# Patient Record
Sex: Female | Born: 1975 | Hispanic: No | Marital: Married | Smoking: Former smoker
Health system: Southern US, Community
[De-identification: ages and names within clinical notes are randomized; demographics above are authoritative.]

## PROBLEM LIST (undated history)

## (undated) DIAGNOSIS — I10 Essential (primary) hypertension: Secondary | ICD-10-CM

## (undated) DIAGNOSIS — G43909 Migraine, unspecified, not intractable, without status migrainosus: Secondary | ICD-10-CM

## (undated) DIAGNOSIS — N200 Calculus of kidney: Secondary | ICD-10-CM

## (undated) DIAGNOSIS — D131 Benign neoplasm of stomach: Secondary | ICD-10-CM

## (undated) DIAGNOSIS — I493 Ventricular premature depolarization: Secondary | ICD-10-CM

## (undated) HISTORY — DX: Essential (primary) hypertension: I10

## (undated) HISTORY — PX: APPENDECTOMY: SHX54

## (undated) HISTORY — PX: ABDOMINAL HYSTERECTOMY: SHX81

## (undated) HISTORY — DX: Calculus of kidney: N20.0

## (undated) HISTORY — PX: CHOLECYSTECTOMY: SHX55

## (undated) HISTORY — PX: HERNIA REPAIR: SHX51

## (undated) HISTORY — DX: Migraine, unspecified, not intractable, without status migrainosus: G43.909

## (undated) HISTORY — PX: TUBAL LIGATION: SHX77

## (undated) HISTORY — PX: 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): SHX6667

---

## 2015-04-17 DIAGNOSIS — D069 Carcinoma in situ of cervix, unspecified: Secondary | ICD-10-CM | POA: Insufficient documentation

## 2017-06-15 ENCOUNTER — Ambulatory Visit: Payer: BLUE CROSS/BLUE SHIELD | Admitting: Cardiovascular Disease

## 2017-06-15 ENCOUNTER — Encounter: Payer: Self-pay | Admitting: Cardiovascular Disease

## 2017-06-15 ENCOUNTER — Encounter (INDEPENDENT_AMBULATORY_CARE_PROVIDER_SITE_OTHER): Payer: Self-pay

## 2017-06-15 VITALS — BP 116/80 | HR 93 | Ht 66.0 in | Wt 214.0 lb

## 2017-06-15 DIAGNOSIS — I493 Ventricular premature depolarization: Secondary | ICD-10-CM | POA: Diagnosis not present

## 2017-06-15 DIAGNOSIS — R0602 Shortness of breath: Secondary | ICD-10-CM

## 2017-06-15 MED ORDER — DILTIAZEM HCL ER COATED BEADS 120 MG PO CP24: 120.0000 mg | ORAL_CAPSULE | Freq: Every day | ORAL | 11 refills | Status: DC

## 2017-06-15 NOTE — Progress Notes (Signed)
Chief Complaint  Patient presents with  . New Patient (Initial Visit)    PVC's   History of Present Illness: 42 yo female with no significant past medical history who is here today as a new consult, referred by Dr. Zadie Rhine, for evaluation of fatigue and abnormal EKG. She has had fatigue and lack of energy for several weeks. She was seen this am by Dr. Radene Ou and was found to have bradycardia on exam. EKG showed sinus rhythm with frequent PVCs in pattern of bigeminy with overall heart rate of 90 bpm. She reports being exhausted for the last few days. She has had dizziness but no syncope. This is when bending over. She denies chest pain but she does have occasional feeling of dyspnea when she is dizzy. She drinks caffeine in the am. She does not take stimulants.   Primary Care Physician: Aretta Nip, MD  Past Medical History:  Diagnosis Date  . HTN (hypertension)   . Migraines   . Nephrolithiasis     Past Surgical History:  Procedure Laterality Date  . ABDOMINAL HYSTERECTOMY    . APPENDECTOMY    . CHOLECYSTECTOMY    . HERNIA REPAIR    . TUBAL LIGATION     Now reversed    Current Outpatient Medications  Medication Sig Dispense Refill  . hydrochlorothiazide (HYDRODIURIL) 25 MG tablet Take 25 mg by mouth daily.  1  . diltiazem (CARDIZEM CD) 120 MG 24 hr capsule Take 1 capsule (120 mg total) by mouth daily. 30 capsule 11   No current facility-administered medications for this visit.     Allergies  Allergen Reactions  . Nitrofurantoin Rash  . Hydrocodone Itching  . Latex Rash  . Prochlorperazine     Other reaction(s): Other Bugs crawling    Social History   Socioeconomic History  . Marital status: Married    Spouse name: Not on file  . Number of children: 3  . Years of education: Not on file  . Highest education level: Not on file  Social Needs  . Financial resource strain: Not on file  . Food insecurity - worry: Not on file  . Food insecurity - inability:  Not on file  . Transportation needs - medical: Not on file  . Transportation needs - non-medical: Not on file  Occupational History  . Occupation: Student [employer]  Tobacco Use  . Smoking status: Former Smoker    Packs/day: 0.50    Years: 10.00    Pack years: 5.00    Types: Cigarettes  . Smokeless tobacco: Never Used  Substance and Sexual Activity  . Alcohol use: Yes    Comment: Rare  . Drug use: No  . Sexual activity: Not on file  Other Topics Concern  . Not on file  Social History Narrative  . Not on file    Family History  Problem Relation Age of Onset  . Heart attack Father        70s    Review of Systems:  As stated in the HPI and otherwise negative.   BP 116/80   Pulse 93   Ht 5\' 6"  (1.676 m)   Wt 214 lb (97.1 kg)   SpO2 98%   BMI 34.54 kg/m   Physical Examination: General: Well developed, well nourished, NAD  HEENT: OP clear, mucus membranes moist  SKIN: warm, dry. No rashes. Neuro: No focal deficits  Musculoskeletal: Muscle strength 5/5 all ext  Psychiatric: Mood and affect normal  Neck: No JVD, no  carotid bruits, no thyromegaly, no lymphadenopathy.  Lungs:Clear bilaterally, no wheezes, rhonci, crackles Cardiovascular: Regular rate and rhythm. No murmurs, gallops or rubs. Abdomen:Soft. Bowel sounds present. Non-tender.  Extremities: No lower extremity edema. Pulses are 2 + in the bilateral DP/PT.  EKG:  EKG from this am in Dr. Radene Ou office is reviewed  The ekg demonstrates Sinus, rate 91 bpm. Frequent PVCs with bigeminy.   Recent Labs: No results found for requested labs within last 8760 hours.   Lipid Panel No results found for: CHOL, TRIG, HDL, CHOLHDL, VLDL, LDLCALC, LDLDIRECT   Wt Readings from Last 3 Encounters:  06/15/17 214 lb (97.1 kg)     Other studies Reviewed: Additional studies/ records that were reviewed today include: office notes, EKG Review of the above records demonstrates:    Assessment and Plan:   1. PVCs/bigeminy: EKG  from this am shows PVCs with bigeminy. Exam this afternoon is regular without ectopy. I suspect that her fatigue is from the PVCS. I will arrange an echo to assess LV function and exclude structural heart disease. I will stop Norvasc and start Cardizem CD 120 mg daily. I will check a TSH and BMET. We will get a 48 hour monitor next week to determine the number of PVCs and response to addition of the Cardizem.   Current medicines are reviewed at length with the patient today.  The patient does not have concerns regarding medicines.  The following changes have been made:  no change  Labs/ tests ordered today include:   Orders Placed This Encounter  Procedures  . Basic Metabolic Panel (BMET)  . TSH  . HOLTER MONITOR - 48 HOUR  . ECHOCARDIOGRAM COMPLETE     Disposition:   FU with me in 6 weeks   Signed, Lauree Chandler, MD 06/15/2017 3:49 PM    [facility] [address] [place] [place], [place], [place]  [address] Phone: [phone]; Fax: [phone]

## 2017-06-15 NOTE — Patient Instructions (Addendum)
Medication Instructions:  Your physician has recommended you make the following change in your medication:  Stop Norvasc. Start Cardizem CD 120 mg by mouth daily.    Labwork: Lab work to be done today--BMP and TSH  Testing/Procedures: Your physician has requested that you have an echocardiogram. Echocardiography is a painless test that uses sound waves to create images of your heart. It provides your doctor with information about the size and shape of your heart and how well your heart's chambers and valves are working. This procedure takes approximately one hour. There are no restrictions for this procedure.  Please schedule for about one week from now.  Your physician has recommended that you wear a holter monitor. Holter monitors are medical devices that record the heart's electrical activity. Doctors most often use these monitors to diagnose arrhythmias. Arrhythmias are problems with the speed or rhythm of the heartbeat. The monitor is a small, portable device. You can wear one while you do your normal daily activities. This is usually used to diagnose what is causing palpitations/syncope (passing out).  Please scheduled for about one week from now  Follow-Up: Your physician recommends that you schedule a follow-up appointment in: about 8 weeks. --Scheduled for April 15,2018 at 9:40    Any Other Special Instructions Will Be Listed Below (If Applicable).     If you need a refill on your cardiac medications before your next appointment, please call your pharmacy.

## 2017-06-16 LAB — BASIC METABOLIC PANEL
BUN/Creatinine Ratio: 10 (ref 9–23)
BUN: 8 mg/dL (ref 6–24)
CO2: 22 mmol/L (ref 20–29)
Calcium: 9 mg/dL (ref 8.7–10.2)
Chloride: 106 mmol/L (ref 96–106)
Creatinine, Ser: 0.79 mg/dL (ref 0.57–1.00)
GFR calc Af Amer: 106 mL/min/{1.73_m2} (ref >59)
GFR calc non Af Amer: 92 mL/min/{1.73_m2} (ref >59)
Glucose: 90 mg/dL (ref 65–99)
Potassium: 4 mmol/L (ref 3.5–5.2)
Sodium: 143 mmol/L (ref 134–144)

## 2017-06-16 LAB — TSH: TSH: 3.58 u[IU]/mL (ref 0.450–4.500)

## 2017-06-23 ENCOUNTER — Ambulatory Visit (HOSPITAL_COMMUNITY): Payer: BLUE CROSS/BLUE SHIELD | Attending: Cardiology

## 2017-06-23 ENCOUNTER — Ambulatory Visit (INDEPENDENT_AMBULATORY_CARE_PROVIDER_SITE_OTHER): Payer: BLUE CROSS/BLUE SHIELD

## 2017-06-23 ENCOUNTER — Other Ambulatory Visit: Payer: Self-pay

## 2017-06-23 DIAGNOSIS — I493 Ventricular premature depolarization: Secondary | ICD-10-CM

## 2017-06-23 DIAGNOSIS — R0602 Shortness of breath: Secondary | ICD-10-CM | POA: Insufficient documentation

## 2017-06-30 ENCOUNTER — Telehealth: Payer: Self-pay | Admitting: Cardiovascular Disease

## 2017-06-30 NOTE — Telephone Encounter (Signed)
Left message to call back  

## 2017-06-30 NOTE — Telephone Encounter (Signed)
New message     Patient is returning your call , she will be done with work after 1pm please call then

## 2017-06-30 NOTE — Telephone Encounter (Signed)
I spoke with pt and reviewed monitor results with her.  She reports she is still having fatigue and thinks BP is not as well controlled as it was on amlodipine. She is going out of town on Saturday for a week.  I scheduled her for follow up with Ermalinda Barrios, PA on March 13,2019 at 11:00.

## 2017-06-30 NOTE — Telephone Encounter (Signed)
New message ° °Pt verbalized that she is returning call for RN °

## 2017-06-30 NOTE — Telephone Encounter (Signed)
Follow Up:; ° ° °Returning your call. °

## 2017-07-06 ENCOUNTER — Encounter: Payer: Self-pay | Admitting: Physician Assistant

## 2017-07-13 ENCOUNTER — Encounter: Payer: Self-pay | Admitting: Physician Assistant

## 2017-07-13 ENCOUNTER — Ambulatory Visit (INDEPENDENT_AMBULATORY_CARE_PROVIDER_SITE_OTHER): Payer: BLUE CROSS/BLUE SHIELD | Admitting: Physician Assistant

## 2017-07-13 DIAGNOSIS — I493 Ventricular premature depolarization: Secondary | ICD-10-CM | POA: Diagnosis not present

## 2017-07-13 DIAGNOSIS — I10 Essential (primary) hypertension: Secondary | ICD-10-CM

## 2017-07-13 DIAGNOSIS — R5383 Other fatigue: Secondary | ICD-10-CM | POA: Diagnosis not present

## 2017-07-13 MED ORDER — METOPROLOL SUCCINATE ER 50 MG PO TB24: 50.0000 mg | ORAL_TABLET | Freq: Every day | ORAL | 11 refills | Status: DC

## 2017-07-13 NOTE — Progress Notes (Signed)
Cardiology Office Note    Date:  07/13/2017   ID:  Glenisha Gundry, DOB 10-Aug-1975, MRN [id number]  PCP:  Aretta Nip, MD  Cardiologist: Lauree Chandler, MD  Chief Complaint  Patient presents with  . Palpitations    History of Present Illness:  Marvelle Span is a 42 y.o. female who saw Dr. Angelena Form for the first time 06/15/17 for fatigue and abnormal EKG.  She had normal sinus rhythm with frequent PVCs in the pattern of bigeminy and overall her heart rate was 90 bpm.  She had dizziness but no syncope.  Norvasc was stopped Cardizem CD 120 mg daily added.  2D echo showed normal LV function EF 55% with grade 1 DD.  48-hour monitor showed frequent PVCs-24,548 and 48-hour.  Often in the pattern of bigeminy and rare PAC.  Dr. Angelena Form 1 to bring her back to adjust her Cardizem if she was not feeling any better and discuss referral to EP.  TSH was normal at 3.58 and potassium and renal function were normal.  Patient comes in today not feeling any better.  She still having just as many palpitations on the Cardizem.  Her blood pressure has been elevated as well.  Mostly diastolic.  She was in [place] last week looking at [employer].  She had no trouble walking around.    Past Medical History:  Diagnosis Date  . HTN (hypertension)   . Migraines   . Nephrolithiasis     Past Surgical History:  Procedure Laterality Date  . ABDOMINAL HYSTERECTOMY    . APPENDECTOMY    . CHOLECYSTECTOMY    . HERNIA REPAIR    . TUBAL LIGATION     Now reversed    Current Medications: Current Meds  Medication Sig  . hydrochlorothiazide (HYDRODIURIL) 25 MG tablet Take 25 mg by mouth daily.  . [DISCONTINUED] diltiazem (CARDIZEM CD) 120 MG 24 hr capsule Take 1 capsule (120 mg total) by mouth daily.     Allergies:   Nitrofurantoin; Hydrocodone; Latex; and Prochlorperazine   Social History   Socioeconomic History  . Marital status: Married    Spouse name: None  . Number of children: 3    . Years of education: None  . Highest education level: None  Social Needs  . Financial resource strain: None  . Food insecurity - worry: None  . Food insecurity - inability: None  . Transportation needs - medical: None  . Transportation needs - non-medical: None  Occupational History  . Occupation: Student [employer]  Tobacco Use  . Smoking status: Former Smoker    Packs/day: 0.50    Years: 10.00    Pack years: 5.00    Types: Cigarettes  . Smokeless tobacco: Never Used  Substance and Sexual Activity  . Alcohol use: Yes    Comment: Rare  . Drug use: No  . Sexual activity: None  Other Topics Concern  . None  Social History Narrative  . None     Family History:  The patient's family history includes Heart attack in her father.   ROS:   Please see the history of present illness.    Review of Systems  Constitution: Positive for malaise/fatigue.  HENT: Negative.   Eyes: Negative.   Cardiovascular: Positive for palpitations.  Respiratory: Negative.   Hematologic/Lymphatic: Negative.   Musculoskeletal: Negative.  Negative for joint pain.  Gastrointestinal: Negative.   Genitourinary: Negative.   Neurological: Negative.    All other systems reviewed and are negative.   PHYSICAL EXAM:  VS:  BP (!) 118/98   Pulse (!) 104   Ht 5\' 6"  (1.676 m)   Wt 209 lb (94.8 kg)   BMI 33.73 kg/m   Physical Exam  GEN: Well nourished, well developed, in no acute distress  Neck: no JVD, carotid bruits, or masses Cardiac:RRR; lots of skipping no murmurs, rubs, or gallops  Respiratory:  clear to auscultation bilaterally, normal work of breathing GI: soft, nontender, nondistended, + BS Ext: without cyanosis, clubbing, or edema, Good distal pulses bilaterally Neuro:  Alert and Oriented x 3, Strength and sensation are intact Psych: euthymic mood, full affect  Wt Readings from Last 3 Encounters:  07/13/17 209 lb (94.8 kg)  06/15/17 214 lb (97.1 kg)      Studies/Labs Reviewed:   EKG:   EKG is not ordered today.    Recent Labs: 06/15/2017: BUN 8; Creatinine, Ser 0.79; Potassium 4.0; Sodium 143; TSH 3.580   Lipid Panel No results found for: CHOL, TRIG, HDL, CHOLHDL, VLDL, LDLCALC, LDLDIRECT  Additional studies/ records that were reviewed today include:  2D echo 06/23/17 Study Conclusions   - Left ventricle: The cavity size was normal. Wall thickness was   increased in a pattern of mild LVH. The estimated ejection   fraction was 55%. Wall motion was normal; there were no regional   wall motion abnormalities. Doppler parameters are consistent with   abnormal left ventricular relaxation (grade 1 diastolic   dysfunction). - Aortic valve: There was no stenosis. There was trivial   regurgitation. - Mitral valve: Mildly calcified annulus. There was no significant   regurgitation. - Right ventricle: The cavity size was normal. Systolic function   was normal. - Tricuspid valve: Peak RV-RA gradient (S): 15 mm Hg. - Pulmonary arteries: PA peak pressure: 18 mm Hg (S). - Inferior vena cava: The vessel was normal in size. The   respirophasic diameter changes were in the normal range (= 50%),   consistent with normal central venous pressure.   Impressions:   - Normal LV size with mild LV hypertrophy. EF 55%. Normal RV size   and systolic function. No significant valvular abnormalities.    48-hour monitor 2/21/18Study Highlights   Sinus rhythm  Frequent premature ventricular contractions (24,548 during 48 hour monitoring period) often in pattern of bigeminy Rare premature atrial contractions (2 during monitoring period)     ASSESSMENT:    1. PVC's (premature ventricular contractions)   2. Fatigue, unspecified type   3. Essential hypertension      PLAN:  In order of problems listed above:  PVCs with over 24,048-hour.  Most likely contributing to fatigue.  Echo basically normal LV function with grade 1 DD.  No improvement with diltiazem.  Discussed with Dr.  Angelena Form.  Will switch to Toprol-XL 50 mg once daily and refer to Dr. Lovena Le for EP evaluation.  Essential hypertension blood pressure not controlled on diltiazem.  Hopefully Toprol will do a better job.  2 g sodium diet.     Medication Adjustments/Labs and Tests Ordered: Current medicines are reviewed at length with the patient today.  Concerns regarding medicines are outlined above.  Medication changes, Labs and Tests ordered today are listed in the Patient Instructions below. Patient Instructions  Medication Instructions:  1) STOP DILTIAZEM 2) START METOPROLOL 50 mg daily  Labwork: None ordered  Testing/Procedures: None ordered  Follow-Up: You have an appointment scheduled with Dr. Lovena Le (the electrophysiology specialist) on Friday, July 29, 2017 at 3:30PM.  Any Other Special Instructions Will Be Listed  Below (If Applicable).     If you need a refill on your cardiac medications before your next appointment, please call your pharmacy.      Sumner Boast, PA-C  07/13/2017 12:49 PM    [facility] [place], [place], [place]  [address] Phone: [phone]; Fax: [phone]

## 2017-07-13 NOTE — Patient Instructions (Addendum)
Medication Instructions:  1) STOP DILTIAZEM 2) START METOPROLOL 50 mg daily  Labwork: None ordered  Testing/Procedures: None ordered  Follow-Up: You have an appointment scheduled with Dr. Lovena Le (the electrophysiology specialist) on Friday, July 29, 2017 at 3:30PM.  Any Other Special Instructions Will Be Listed Below (If Applicable).     If you need a refill on your cardiac medications before your next appointment, please call your pharmacy.

## 2017-07-20 ENCOUNTER — Telehealth: Payer: Self-pay | Admitting: Cardiovascular Disease

## 2017-07-20 MED ORDER — METOPROLOL SUCCINATE ER 25 MG PO TB24: 25.0000 mg | ORAL_TABLET | Freq: Every day | ORAL | 11 refills | Status: DC

## 2017-07-20 NOTE — Telephone Encounter (Signed)
I spoke with Lisa Mosley. She states since starting Toprol she has been having shortness of breath with exertion. Walking from car into bank today she became very short of breath. Occurs when walking to bathroom or climbing stairs.  Resolves with rest.  No chest pain with exertion. Has occasional episodes of chest pain which come in waves. Usually occur when she is sitting and last for a few seconds. This is not new and she thinks this is related to palpitations.  No change in palpitations or fatigue since starting Toprol.  States BP has improved. Last check was around 118/80. She is seeing Dr. Curt Bears on 4/2 for EP evaluation.

## 2017-07-20 NOTE — Telephone Encounter (Signed)
New message   Pt c/o medication issue:  1. Name of Medication: metoprolol succinate (TOPROL-XL) 50 MG 24 hr tablet  2. How are you currently taking this medication (dosage and times per day)? Take 1 tablet (50 mg total) by mouth daily. Take with or immediately following a meal.  3. Are you having a reaction (difficulty breathing--STAT)? Sob when walking - not now   4. What is your medication issue? New medication / discuss with nurse

## 2017-07-20 NOTE — Telephone Encounter (Signed)
Patient can have shortness of breath with Toprol.Can you ask her if she felt better on cardizem? Can decrease toprol to 25 mg to see if that helps. I'd have her discuss with Dr. Lovena Le at [facility].

## 2017-07-20 NOTE — Telephone Encounter (Signed)
I spoke with pt. She did not feel better on cardizem.  I gave her instructions from Ermalinda Barrios, PA to decrease Toprol to 25 mg daily.

## 2017-07-29 ENCOUNTER — Institutional Professional Consult (permissible substitution): Payer: Self-pay | Admitting: Internal Medicine

## 2017-08-02 ENCOUNTER — Encounter: Payer: Self-pay | Admitting: Cardiology

## 2017-08-02 ENCOUNTER — Ambulatory Visit: Payer: BLUE CROSS/BLUE SHIELD | Admitting: Cardiology

## 2017-08-02 VITALS — BP 108/80 | HR 101 | Ht 66.0 in | Wt 206.0 lb

## 2017-08-02 DIAGNOSIS — I493 Ventricular premature depolarization: Secondary | ICD-10-CM | POA: Diagnosis not present

## 2017-08-02 DIAGNOSIS — I10 Essential (primary) hypertension: Secondary | ICD-10-CM

## 2017-08-02 MED ORDER — FLECAINIDE ACETATE 100 MG PO TABS: 100.0000 mg | ORAL_TABLET | Freq: Two times a day (BID) | ORAL | 3 refills | Status: DC

## 2017-08-02 MED ORDER — DILTIAZEM HCL ER COATED BEADS 180 MG PO CP24: 180.0000 mg | ORAL_CAPSULE | Freq: Every day | ORAL | 3 refills | Status: DC

## 2017-08-02 NOTE — Progress Notes (Signed)
Electrophysiology Office Note   Date:  08/02/2017   ID:  Lisa Mosley, DOB 09-Jul-1975, MRN [id number]  PCP:  Lisa Nip, MD  Cardiologist:  Lisa Mosley Primary Electrophysiologist:  Lisa Tompkins Meredith Leeds, MD    Chief Complaint  Patient presents with  . Advice Only    PVC's     History of Present Illness: Lisa Mosley is a 42 y.o. female who is being seen today for the evaluation of PVCs at the request of Lisa Mosley. Presenting today for electrophysiology evaluation.  He initially presented to cardiology 06/15/2017 for fatigue and an abnormal EKG.  She was found to have frequent PVCs in the pattern of bigeminy.  She had dizziness but no syncope.  She was started on Cardizem 120 mg.  Echo showed an EF of 55% with grade 1 diastolic dysfunction.  48-hour monitor showed 24,000 PVCs in 48 hours.  Patient continues to have palpitations with Cardizem.  Her blood pressure has been elevated.  Today continued to have fatigue and weakness.  She feels that her palpitations are continuing.  Is trying to exercise but is limited due to her episodes of fatigue.  Today, she denies symptoms of chest pain, shortness of breath, orthopnea, PND, lower extremity edema, claudication, dizziness, presyncope, syncope, bleeding, or neurologic sequela. The patient is tolerating medications without difficulties.    Past Medical History:  Diagnosis Date  . HTN (hypertension)   . Migraines   . Nephrolithiasis    Past Surgical History:  Procedure Laterality Date  . ABDOMINAL HYSTERECTOMY    . APPENDECTOMY    . CHOLECYSTECTOMY    . HERNIA REPAIR    . TUBAL LIGATION     Now reversed     Current Outpatient Medications  Medication Sig Dispense Refill  . hydrochlorothiazide (HYDRODIURIL) 25 MG tablet Take 25 mg by mouth daily.  1  . metoprolol succinate (TOPROL-XL) 25 MG 24 hr tablet Take 1 tablet (25 mg total) by mouth daily. Take with or immediately following a meal. 30 tablet 11   No current  facility-administered medications for this visit.     Allergies:   Nitrofurantoin; Hydrocodone; Latex; and Prochlorperazine   Social History:  The patient  reports that she has quit smoking. Her smoking use included cigarettes. She has a 5.00 pack-year smoking history. She has never used smokeless tobacco. She reports that she drinks alcohol. She reports that she does not use drugs.   Family History:  The patient's family history includes Heart attack in her father.    ROS:  Please see the history of present illness.   Otherwise, review of systems is positive for chest pain, palpitations, dyspnea on exertion, diarrhea, anxiety, dizziness.   All other systems are reviewed and negative.    PHYSICAL EXAM: VS:  BP 108/80   Pulse (!) 101   Ht 5\' 6"  (1.676 m)   Wt 206 lb (93.4 kg)   BMI 33.25 kg/m  , BMI Body mass index is 33.25 kg/m. GEN: Well nourished, well developed, in no acute distress  HEENT: normal  Neck: no JVD, carotid bruits, or masses Cardiac: RRR; no murmurs, rubs, or gallops,no edema  Respiratory:  clear to auscultation bilaterally, normal work of breathing GI: soft, nontender, nondistended, + BS MS: no deformity or atrophy  Skin: warm and dry Neuro:  Strength and sensation are intact Psych: euthymic mood, full affect  EKG:  EKG is ordered today. Personal review of the ekg ordered shows sinus tachycardia, rate 101  Recent Labs: 06/15/2017:  BUN 8; Creatinine, Ser 0.79; Potassium 4.0; Sodium 143; TSH 3.580    Lipid Panel  No results found for: CHOL, TRIG, HDL, CHOLHDL, VLDL, LDLCALC, LDLDIRECT   Wt Readings from Last 3 Encounters:  08/02/17 206 lb (93.4 kg)  07/13/17 209 lb (94.8 kg)  06/15/17 214 lb (97.1 kg)      Other studies Reviewed: Additional studies/ records that were reviewed today include: TTE 06/23/17  Review of the above records today demonstrates:  - Left ventricle: The cavity size was normal. Wall thickness was   increased in a pattern of mild  LVH. The estimated ejection   fraction was 55%. Wall motion was normal; there were no regional   wall motion abnormalities. Doppler parameters are consistent with   abnormal left ventricular relaxation (grade 1 diastolic   dysfunction). - Aortic valve: There was no stenosis. There was trivial   regurgitation. - Mitral valve: Mildly calcified annulus. There was no significant   regurgitation. - Right ventricle: The cavity size was normal. Systolic function   was normal. - Tricuspid valve: Peak RV-RA gradient (S): 15 mm Hg. - Pulmonary arteries: PA peak pressure: 18 mm Hg (S). - Inferior vena cava: The vessel was normal in size. The   respirophasic diameter changes were in the normal range (= 50%),   consistent with normal central venous pressure.  Holter 06/30/17 - personally reviewed Sinus rhythm  Frequent premature ventricular contractions (24,548 during 48 hour monitoring period) often in pattern of bigeminy Rare premature atrial contractions (2 during monitoring period)  ASSESSMENT AND PLAN:  1.  PVCs: High burden 48 hours.  She does have symptoms of fatigue and palpitations.  She is continuing to have symptoms despite medical management with metoprolol and diltiazem.  I talked to her about the possibility of invasive procedures with ablation versus continued medical management.  She would like to avoid procedures at this time.  We Lisa Mosley thus start her on flecainide 100 mg as well as stopping her metoprolol and restarting her diltiazem.  Hopefully coming off of metoprolol Lisa Mosley help with some of her fatigue.  2.  Hypertension: Blood pressure well controlled today.  Switching from metoprolol to diltiazem.      Current medicines are reviewed at length with the patient today.   The patient does not have concerns regarding her medicines.  The following changes were made today: Flecainide, diltiazem, stop metoprolol  Labs/ tests ordered today include:  Orders Placed This Encounter    Procedures  . EKG 12-Lead   Discussed with primary cardiology  Disposition:   FU with Lisa Mosley 3 months  Signed, Lisa Oguinn Meredith Leeds, MD  08/02/2017 12:14 PM     [place] [address] [phone] (office) [phone] (fax)

## 2017-08-02 NOTE — Patient Instructions (Addendum)
Medication Instructions: 1) STOP Metoprolol 2) START Diltiazem 180 mg daily 3) START Flecainide 100 mg twice daily - YOU WILL START THIS MEDICATION 7-10 DAYS PRIOR TO STRESS TESTING.  Labwork: None ordered  Procedures/Testing: Your physician has requested that you have an exercise tolerance test at least 7-10 days from now. For further information please visit [email or web address]. Please also follow instruction sheet, as given.   Follow-Up: Your physician recommends that you schedule a follow-up appointment in: 3 months with Dr. Curt Bears.   If you need a refill on your cardiac medications before your next appointment, please call your pharmacy.    Any Additional Special Instructions Will Be Listed Below (If Applicable).  Flecainide tablets What is this medicine? FLECAINIDE (FLEK a nide) is an antiarrhythmic drug. This medicine is used to prevent irregular heart rhythm. It can also slow down fast heartbeats called tachycardia. This medicine may be used for other purposes; ask your health care provider or pharmacist if you have questions. COMMON BRAND NAME(S): Tambocor What should I tell my health care provider before I take this medicine? They need to know if you have any of these conditions: -abnormal levels of potassium in the blood -heart disease including heart rhythm and heart rate problems -kidney or liver disease -recent heart attack -an unusual or allergic reaction to flecainide, local anesthetics, other medicines, foods, dyes, or preservatives -pregnant or trying to get pregnant -breast-feeding How should I use this medicine? Take this medicine by mouth with a glass of water. Follow the directions on the prescription label. You can take this medicine with or without food. Take your doses at regular intervals. Do not take your medicine more often than directed. Do not stop taking this medicine suddenly. This may cause serious, heart-related side effects. If your doctor wants  you to stop the medicine, the dose may be slowly lowered over time to avoid any side effects. Talk to your pediatrician regarding the use of this medicine in children. While this drug may be prescribed for children as young as 1 year of age for selected conditions, precautions do apply. Overdosage: If you think you have taken too much of this medicine contact a poison control center or emergency room at once. NOTE: This medicine is only for you. Do not share this medicine with others. What if I miss a dose? If you miss a dose, take it as soon as you can. If it is almost time for your next dose, take only that dose. Do not take double or extra doses. What may interact with this medicine? Do not take this medicine with any of the following medications: -amoxapine -arsenic trioxide -certain antibiotics like clarithromycin, erythromycin, gatifloxacin, gemifloxacin, levofloxacin, moxifloxacin, sparfloxacin, or troleandomycin -certain antidepressants called tricyclic antidepressants like amitriptyline, imipramine, or nortriptyline -certain medicines to control heart rhythm like disopyramide, dofetilide, encainide, moricizine, procainamide, propafenone, and quinidine -cisapride -cyclobenzaprine -delavirdine -droperidol -haloperidol -hawthorn -imatinib -levomethadyl -maprotiline -medicines for malaria like chloroquine and halofantrine -pentamidine -phenothiazines like chlorpromazine, mesoridazine, prochlorperazine, thioridazine -pimozide -quinine -ranolazine -ritonavir -sertindole -ziprasidone This medicine may also interact with the following medications: -cimetidine -medicines for angina or high blood pressure -medicines to control heart rhythm like amiodarone and digoxin This list may not describe all possible interactions. Give your health care provider a list of all the medicines, herbs, non-prescription drugs, or dietary supplements you use. Also tell them if you smoke, drink  alcohol, or use illegal drugs. Some items may interact with your medicine. What should I watch for while using this medicine?  Visit your doctor or health care professional for regular checks on your progress. Because your condition and the use of this medicine carries some risk, it is a good idea to carry an identification card, necklace or bracelet with details of your condition, medications and doctor or health care professional. Check your blood pressure and pulse rate regularly. Ask your health care professional what your blood pressure and pulse rate should be, and when you should contact him or her. Your doctor or health care professional also may schedule regular blood tests and electrocardiograms to check your progress. You may get drowsy or dizzy. Do not drive, use machinery, or do anything that needs mental alertness until you know how this medicine affects you. Do not stand or sit up quickly, especially if you are an older patient. This reduces the risk of dizzy or fainting spells. Alcohol can make you more dizzy, increase flushing and rapid heartbeats. Avoid alcoholic drinks. What side effects may I notice from receiving this medicine? Side effects that you should report to your doctor or health care professional as soon as possible: -chest pain, continued irregular heartbeats -difficulty breathing -swelling of the legs or feet -trembling, shaking -unusually weak or tired Side effects that usually do not require medical attention (report to your doctor or health care professional if they continue or are bothersome): -blurred vision -constipation -headache -nausea, vomiting -stomach pain This list may not describe all possible side effects. Call your doctor for medical advice about side effects. You may report side effects to FDA at 1-800-FDA-1088. Where should I keep my medicine? Keep out of the reach of children. Store at room temperature between 15 and 30 degrees C (59 and 86  degrees F). Protect from light. Keep container tightly closed. Throw away any unused medicine after the expiration date. NOTE: This sheet is a summary. It may not cover all possible information. If you have questions about this medicine, talk to your doctor, pharmacist, or health care provider.  2018 Elsevier/Gold Standard (2007-08-23 16:46:09)    Exercise Stress Electrocardiogram An exercise stress electrocardiogram is a test to check how blood flows to your heart. It is done to find areas of poor blood flow. You will need to walk on a treadmill for this test. The electrocardiogram will record your heartbeat when you are at rest and when you are exercising. What happens before the procedure?  Do not have drinks with caffeine or foods with caffeine for 24 hours before the test, or as told by your doctor. This includes coffee, tea (even decaf tea), sodas, chocolate, and cocoa.  Follow your doctor's instructions about eating and drinking before the test.  Ask your doctor what medicines you should or should not take before the test. Take your medicines with water unless told by your doctor not to.  If you use an inhaler, bring it with you to the test.  Bring a snack to eat after the test.  Do not  smoke for 4 hours before the test.  Do not put lotions, powders, creams, or oils on your chest before the test.  Wear comfortable shoes and clothing. What happens during the procedure?  You will have patches put on your chest. Small areas of your chest may need to be shaved. Wires will be connected to the patches.  Your heart rate will be watched while you are resting and while you are exercising.  You will walk on the treadmill. The treadmill will slowly get faster to raise your heart rate.  The test will take about 1-2 hours. What happens after the procedure?  Your heart rate and blood pressure will be watched after the test.  You may return to your normal diet, activities, and  medicines or as told by your doctor. This information is not intended to replace advice given to you by your health care provider. Make sure you discuss any questions you have with your health care provider. Document Released: 10/06/2007 Document Revised: 12/17/2015 Document Reviewed: 12/25/2012 Elsevier Interactive Patient Education  [employer].

## 2017-08-02 NOTE — Addendum Note (Signed)
Addended by: Stanton Kidney on: 08/02/2017 12:32 PM   Modules accepted: Orders

## 2017-08-02 NOTE — Addendum Note (Signed)
Addended by: Sandrea Hammond D on: 08/02/2017 12:27 PM   Modules accepted: Orders

## 2017-08-15 ENCOUNTER — Ambulatory Visit: Payer: BLUE CROSS/BLUE SHIELD | Admitting: Cardiovascular Disease

## 2017-08-17 ENCOUNTER — Ambulatory Visit (INDEPENDENT_AMBULATORY_CARE_PROVIDER_SITE_OTHER): Payer: BLUE CROSS/BLUE SHIELD

## 2017-08-17 DIAGNOSIS — I493 Ventricular premature depolarization: Secondary | ICD-10-CM | POA: Diagnosis not present

## 2017-08-17 LAB — EXERCISE TOLERANCE TEST
Estimated workload: 10 METS
Exercise duration (min): 8 min
Exercise duration (sec): 0 s
MPHR: 177 {beats}/min
Peak HR: 164 {beats}/min
Percent HR: 92 %
RPE: 17
Rest HR: 95 {beats}/min

## 2017-10-26 ENCOUNTER — Other Ambulatory Visit: Payer: Self-pay | Admitting: Cardiology

## 2017-11-01 ENCOUNTER — Ambulatory Visit: Payer: BLUE CROSS/BLUE SHIELD | Admitting: Cardiology

## 2017-11-01 ENCOUNTER — Encounter: Payer: Self-pay | Admitting: Cardiology

## 2017-11-01 VITALS — BP 130/98 | HR 78 | Ht 66.0 in | Wt 211.0 lb

## 2017-11-01 DIAGNOSIS — I10 Essential (primary) hypertension: Secondary | ICD-10-CM | POA: Diagnosis not present

## 2017-11-01 DIAGNOSIS — I493 Ventricular premature depolarization: Secondary | ICD-10-CM

## 2017-11-01 MED ORDER — LOSARTAN POTASSIUM 50 MG PO TABS: 50.0000 mg | ORAL_TABLET | Freq: Every day | ORAL | 6 refills | Status: DC

## 2017-11-01 NOTE — Progress Notes (Signed)
Electrophysiology Office Note   Date:  11/01/2017   ID:  Lisa Mosley, DOB Jun 25, 1975, MRN [id number]  PCP:  Aretta Nip, MD  Cardiologist:  Angelena Form Primary Electrophysiologist:  Luceal Hollibaugh Meredith Leeds, MD    Chief Complaint  Patient presents with  . Follow-up    PVC's     History of Present Illness: Lisa Mosley is a 42 y.o. female who is being seen today for the evaluation of PVCs at the request of [name]. Presenting today for electrophysiology evaluation.  He initially presented to cardiology 06/15/2017 for fatigue and an abnormal EKG.  She was found to have frequent PVCs in the pattern of bigeminy.  She had dizziness but no syncope.  She was started on Cardizem 120 mg.  Echo showed an EF of 55% with grade 1 diastolic dysfunction.  48-hour monitor showed 24,000 PVCs in 48 hours.  Patient continues to have palpitations with Cardizem.  Her blood pressure has been elevated.  Today, denies symptoms of palpitations, chest pain, shortness of breath, orthopnea, PND, lower extremity edema, claudication, dizziness, presyncope, syncope, bleeding, or neurologic sequela. The patient is tolerating medications without difficulties.  Overall she is feeling well.  She has noted no further episodes of extreme fatigue that she had with her PVCs.  She feels that her PVCs are well controlled on the flecainide.   Past Medical History:  Diagnosis Date  . HTN (hypertension)   . Migraines   . Nephrolithiasis    Past Surgical History:  Procedure Laterality Date  . ABDOMINAL HYSTERECTOMY    . APPENDECTOMY    . CHOLECYSTECTOMY    . HERNIA REPAIR    . TUBAL LIGATION     Now reversed     Current Outpatient Medications  Medication Sig Dispense Refill  . diltiazem (CARDIZEM CD) 180 MG 24 hr capsule Take 1 capsule (180 mg total) by mouth daily. 90 capsule 3  . flecainide (TAMBOCOR) 100 MG tablet Take 1 tablet (100 mg total) by mouth 2 (two) times daily. 180 tablet 3  .  hydrochlorothiazide (HYDRODIURIL) 25 MG tablet Take 25 mg by mouth daily.  1   No current facility-administered medications for this visit.     Allergies:   Nitrofurantoin; Hydrocodone; Latex; and Prochlorperazine   Social History:  The patient  reports that she has quit smoking. Her smoking use included cigarettes. She has a 5.00 pack-year smoking history. She has never used smokeless tobacco. She reports that she drinks alcohol. She reports that she does not use drugs.   Family History:  The patient's family history includes Heart attack in her father.   ROS:  Please see the history of present illness.   Otherwise, review of systems is positive for this pain, shortness of breath, depression, anxiety, headaches.   All other systems are reviewed and negative.   PHYSICAL EXAM: VS:  BP (!) 130/98   Pulse 78   Ht 5\' 6"  (1.676 m)   Wt 211 lb (95.7 kg)   BMI 34.06 kg/m  , BMI Body mass index is 34.06 kg/m. GEN: Well nourished, well developed, in no acute distress  HEENT: normal  Neck: no JVD, carotid bruits, or masses Cardiac: RRR; no murmurs, rubs, or gallops,no edema  Respiratory:  clear to auscultation bilaterally, normal work of breathing GI: soft, nontender, nondistended, + BS MS: no deformity or atrophy  Skin: warm and dry Neuro:  Strength and sensation are intact Psych: euthymic mood, full affect  EKG:  EKG is ordered today. Personal  review of the ekg ordered shows anus rhythm, rate 78, QTc 483 ms  Recent Labs: 06/15/2017: BUN 8; Creatinine, Ser 0.79; Potassium 4.0; Sodium 143; TSH 3.580    Lipid Panel  No results found for: CHOL, TRIG, HDL, CHOLHDL, VLDL, LDLCALC, LDLDIRECT   Wt Readings from Last 3 Encounters:  11/01/17 211 lb (95.7 kg)  08/02/17 206 lb (93.4 kg)  07/13/17 209 lb (94.8 kg)      Other studies Reviewed: Additional studies/ records that were reviewed today include: TTE 06/23/17  Review of the above records today demonstrates:  - Left ventricle: The  cavity size was normal. Wall thickness was   increased in a pattern of mild LVH. The estimated ejection   fraction was 55%. Wall motion was normal; there were no regional   wall motion abnormalities. Doppler parameters are consistent with   abnormal left ventricular relaxation (grade 1 diastolic   dysfunction). - Aortic valve: There was no stenosis. There was trivial   regurgitation. - Mitral valve: Mildly calcified annulus. There was no significant   regurgitation. - Right ventricle: The cavity size was normal. Systolic function   was normal. - Tricuspid valve: Peak RV-RA gradient (S): 15 mm Hg. - Pulmonary arteries: PA peak pressure: 18 mm Hg (S). - Inferior vena cava: The vessel was normal in size. The   respirophasic diameter changes were in the normal range (= 50%),   consistent with normal central venous pressure.  Holter 06/30/17 - personally reviewed Sinus rhythm  Frequent premature ventricular contractions (24,548 during 48 hour monitoring period) often in pattern of bigeminy Rare premature atrial contractions (2 during monitoring period)  ASSESSMENT AND PLAN:  1.  PVCs: She had high burden of PVCs in 48 hours and had symptoms of fatigue and palpitations.  She was put on flecainide which had a great improvement in her symptoms.  At this point, she would like to avoid further invasive testing.  We Bernis Stecher continue flecainide.  Systolic blood pressure is well controlled but diastolic is elevated.  2.  Hypertension: Due to that, we Zaryiah Barz stop her hydrochlorothiazide and start her on 50 of losartan.   Current medicines are reviewed at length with the patient today.   The patient does not have concerns regarding her medicines.  The following changes were made today: Stop hydrochlorothiazide, start losartan  Labs/ tests ordered today include:  Orders Placed This Encounter  Procedures  . EKG 12-Lead    Disposition:   FU with Kiarah Eckstein 6 months  Signed, Velmer Broadfoot Meredith Leeds,  MD  11/01/2017 9:47 AM     [facility] [address] [place] [address] [phone] (office) [phone] (fax)

## 2017-11-01 NOTE — Patient Instructions (Signed)
Medication Instructions: 1) Stop Hydrochlorothiazide   2) Start Losartan 50 mg once daily  Labwork: None ordered   Procedures/Testing: None ordered  Follow-Up: Your physician recommends that you schedule a follow-up appointment in: 6 months with Dr. Curt Bears.   Any Additional Special Instructions Will Be Listed Below (If Applicable).     If you need a refill on your cardiac medications before your next appointment, please call your pharmacy.

## 2017-11-01 NOTE — Addendum Note (Signed)
Addended by: Sandrea Hammond D on: 11/01/2017 10:01 AM   Modules accepted: Orders

## 2017-11-21 ENCOUNTER — Encounter: Payer: Self-pay | Admitting: Cardiology

## 2017-11-21 ENCOUNTER — Other Ambulatory Visit: Payer: Self-pay

## 2017-11-21 MED ORDER — FLECAINIDE ACETATE 100 MG PO TABS: 100.0000 mg | ORAL_TABLET | Freq: Two times a day (BID) | ORAL | 0 refills | Status: DC

## 2017-11-21 MED ORDER — LOSARTAN POTASSIUM 50 MG PO TABS: 50.0000 mg | ORAL_TABLET | Freq: Every day | ORAL | 0 refills | Status: DC

## 2017-11-24 ENCOUNTER — Encounter: Payer: Self-pay | Admitting: Cardiology

## 2017-11-24 ENCOUNTER — Other Ambulatory Visit: Payer: Self-pay | Admitting: Cardiology

## 2017-11-24 NOTE — Telephone Encounter (Signed)
Pt sent MyChart message stating:  Hi, I accidentally requested a refill for Losartan but I don't actually need that one because it's already been refilled. Please ignore that request and I'm sorry for the confusion.   Thanks,   Lisa Mosley

## 2017-12-01 MED ORDER — DILTIAZEM HCL ER COATED BEADS 180 MG PO CP24: 180.0000 mg | ORAL_CAPSULE | Freq: Every day | ORAL | 3 refills | Status: DC

## 2017-12-01 NOTE — Addendum Note (Signed)
Addended by: Derl Barrow on: 12/01/2017 11:05 AM   Modules accepted: Orders

## 2017-12-01 NOTE — Telephone Encounter (Signed)
Pt's medication was resent to a mail order pharmacy. Confirmation received.  °

## 2018-01-26 ENCOUNTER — Telehealth: Payer: Self-pay | Admitting: Cardiology

## 2018-01-26 NOTE — Telephone Encounter (Signed)
Pt reports noticing frequent PVCs again, since Monday. She is feeling her PVCs again causing her to be fatigued.  She also reports some chest discomfort w/ PVCs. Pt is scheduled for follow up to discuss on Monday. Advised to monitor and call the office if worsens before then or go to the E.R. for evaluation. Patient verbalized understanding and agreeable to plan.  Will forward to Dr. Curt Bears for any advisement he may have before appt on Monday.  Pt understands I will only call back if he has recommendations before next week.

## 2018-01-26 NOTE — Telephone Encounter (Signed)
New Message   Patient is calling because she has been having more frequent PVC's, She also status that she has been a bit more fatigue with some chest pain. She is not sure whether or not she should come in but I did schedule her an appt for the 9/30 at 11:00am. Please call to discuss.

## 2018-01-30 ENCOUNTER — Ambulatory Visit: Payer: BLUE CROSS/BLUE SHIELD | Admitting: Cardiology

## 2018-01-30 ENCOUNTER — Encounter: Payer: Self-pay | Admitting: Cardiology

## 2018-01-30 VITALS — BP 130/84 | HR 76 | Ht 66.0 in | Wt 208.0 lb

## 2018-01-30 DIAGNOSIS — I10 Essential (primary) hypertension: Secondary | ICD-10-CM

## 2018-01-30 DIAGNOSIS — I493 Ventricular premature depolarization: Secondary | ICD-10-CM

## 2018-01-30 MED ORDER — DILTIAZEM HCL ER COATED BEADS 120 MG PO CP24: 120.0000 mg | ORAL_CAPSULE | Freq: Every day | ORAL | 3 refills | Status: DC

## 2018-01-30 NOTE — Patient Instructions (Addendum)
Medication Instructions:  Your physician has recommended you make the following change in your medication:  1. INCREASE Diltiazem to 120 mg once daily  * If you need a refill on your cardiac medications before your next appointment, please call your pharmacy.   Labwork: None ordered  Testing/Procedures: Your physician has recommended that you wear a 48 hour holter monitor. Holter monitors are medical devices that record the heart's electrical activity. Doctors most often use these monitors to diagnose arrhythmias. Arrhythmias are problems with the speed or rhythm of the heartbeat. The monitor is a small, portable device. You can wear one while you do your normal daily activities. This is usually used to diagnose what is causing palpitations/syncope (passing out).   Follow-Up: Your physician recommends that you schedule a follow-up appointment in: 3 months with Dr. Curt Bears.   Thank you for choosing [facility]!!   Trinidad Curet, RN [phone]

## 2018-01-30 NOTE — Progress Notes (Signed)
Electrophysiology Office Note   Date:  01/30/2018   ID:  Lisa Mosley, DOB 05-Jul-1975, MRN [id number]  PCP:  Aretta Nip, MD  Cardiologist:  Angelena Form Primary Electrophysiologist:  Omolara Carol Meredith Leeds, MD    No chief complaint on file.    History of Present Illness: Lisa Mosley is a 42 y.o. female who is being seen today for the evaluation of PVCs at the request of Lisa Mosley. Presenting today for electrophysiology evaluation.  He initially presented to cardiology 06/15/2017 for fatigue and an abnormal EKG.  She was found to have frequent PVCs in the pattern of bigeminy.  She had dizziness but no syncope.  She was started on Cardizem 120 mg.  Echo showed an EF of 55% with grade 1 diastolic dysfunction.  48-hour monitor showed 24,000 PVCs in 48 hours.  Patient continues to have palpitations with Cardizem.  Her blood pressure has been elevated.  Today, denies symptoms of chest pain, shortness of breath, orthopnea, PND, lower extremity edema, claudication, dizziness, presyncope, syncope, bleeding, or neurologic sequela. The patient is tolerating medications without difficulties.  Fortunately, she is started to have palpitations again.  This started 1 week ago.  There were no exacerbating or alleviating factors.  It feels very similar to when she had her previous episodes of PVCs.  She felt well before 1 week ago.   Past Medical History:  Diagnosis Date  . HTN (hypertension)   . Migraines   . Nephrolithiasis    Past Surgical History:  Procedure Laterality Date  . ABDOMINAL HYSTERECTOMY    . APPENDECTOMY    . CHOLECYSTECTOMY    . HERNIA REPAIR    . TUBAL LIGATION     Now reversed     Current Outpatient Medications  Medication Sig Dispense Refill  . escitalopram (LEXAPRO) 10 MG tablet Take 10 mg by mouth daily.  2  . flecainide (TAMBOCOR) 100 MG tablet Take 1 tablet (100 mg total) by mouth 2 (two) times daily. 180 tablet 0  . losartan (COZAAR) 50 MG tablet Take 1  tablet (50 mg total) by mouth daily. 90 tablet 0  . diltiazem (CARDIZEM CD) 120 MG 24 hr capsule Take 1 capsule (120 mg total) by mouth daily. 30 capsule 3   No current facility-administered medications for this visit.     Allergies:   Nitrofurantoin; Prochlorperazine; Hydrocodone; and Latex   Social History:  The patient  reports that she has quit smoking. Her smoking use included cigarettes. She has a 5.00 pack-year smoking history. She has never used smokeless tobacco. She reports that she drinks alcohol. She reports that she does not use drugs.   Family History:  The patient's family history includes Heart attack in her father.   ROS:  Please see the history of present illness.   Otherwise, review of systems is positive for fatigue, chest pain, leg pain, palpitations.   All other systems are reviewed and negative.   PHYSICAL EXAM: VS:  BP 130/84   Pulse 76   Ht 5\' 6"  (1.676 m)   Wt 208 lb (94.3 kg)   BMI 33.57 kg/m  , BMI Body mass index is 33.57 kg/m. GEN: Well nourished, well developed, in no acute distress  HEENT: normal  Neck: no JVD, carotid bruits, or masses Cardiac: RRR; no murmurs, rubs, or gallops,no edema  Respiratory:  clear to auscultation bilaterally, normal work of breathing GI: soft, nontender, nondistended, + BS MS: no deformity or atrophy  Skin: warm and dry Neuro:  Strength  and sensation are intact Psych: euthymic mood, full affect  EKG:  EKG is ordered today. Personal review of the ekg ordered shows this rhythm, rate 76   Recent Labs: 06/15/2017: BUN 8; Creatinine, Ser 0.79; Potassium 4.0; Sodium 143; TSH 3.580    Lipid Panel  No results found for: CHOL, TRIG, HDL, CHOLHDL, VLDL, LDLCALC, LDLDIRECT   Wt Readings from Last 3 Encounters:  01/30/18 208 lb (94.3 kg)  11/01/17 211 lb (95.7 kg)  08/02/17 206 lb (93.4 kg)      Other studies Reviewed: Additional studies/ records that were reviewed today include: TTE 06/23/17  Review of the above  records today demonstrates:  - Left ventricle: The cavity size was normal. Wall thickness was   increased in a pattern of mild LVH. The estimated ejection   fraction was 55%. Wall motion was normal; there were no regional   wall motion abnormalities. Doppler parameters are consistent with   abnormal left ventricular relaxation (grade 1 diastolic   dysfunction). - Aortic valve: There was no stenosis. There was trivial   regurgitation. - Mitral valve: Mildly calcified annulus. There was no significant   regurgitation. - Right ventricle: The cavity size was normal. Systolic function   was normal. - Tricuspid valve: Peak RV-RA gradient (S): 15 mm Hg. - Pulmonary arteries: PA peak pressure: 18 mm Hg (S). - Inferior vena cava: The vessel was normal in size. The   respirophasic diameter changes were in the normal range (= 50%),   consistent with normal central venous pressure.  Holter 06/30/17 - personally reviewed Sinus rhythm  Frequent premature ventricular contractions (24,548 during 48 hour monitoring period) often in pattern of bigeminy Rare premature atrial contractions (2 during monitoring period)  ASSESSMENT AND PLAN:  1.  PVCs: He recently had a 9% burden.  Flecainide which greatly improved her symptoms.  She returns today having symptoms similar to when she was having PVCs.  She is not having any PVCs on her EKG today.  Due to that, we Taneal Sonntag get a 48-hour monitor to further evaluate.  Also having some fatigue.  We Valary Manahan see if decreasing diltiazem Luverne Zerkle help.  2.  Hypertension: Well-controlled today.  No changes.  Current medicines are reviewed at length with the patient today.   The patient does not have concerns regarding her medicines.  The following changes were made today: Decrease diltiazem  Labs/ tests ordered today include:  Orders Placed This Encounter  Procedures  . Holter monitor - 48 hour  . EKG 12-Lead    Disposition:   FU with Channel Papandrea 3  months  Signed, Mieko Kneebone Meredith Leeds, MD  01/30/2018 11:16 AM     [facility] [address] [place] [address] [phone] (office) [phone] (fax)

## 2018-01-31 ENCOUNTER — Ambulatory Visit (INDEPENDENT_AMBULATORY_CARE_PROVIDER_SITE_OTHER): Payer: BLUE CROSS/BLUE SHIELD

## 2018-01-31 DIAGNOSIS — I493 Ventricular premature depolarization: Secondary | ICD-10-CM

## 2018-02-25 ENCOUNTER — Other Ambulatory Visit: Payer: Self-pay | Admitting: Cardiology

## 2018-03-01 ENCOUNTER — Other Ambulatory Visit: Payer: Self-pay | Admitting: Cardiology

## 2018-03-01 MED ORDER — DILTIAZEM HCL ER COATED BEADS 120 MG PO CP24: 120.0000 mg | ORAL_CAPSULE | Freq: Every day | ORAL | 0 refills | Status: DC

## 2018-03-04 ENCOUNTER — Encounter (HOSPITAL_COMMUNITY): Payer: Self-pay | Admitting: Emergency Medicine

## 2018-03-04 ENCOUNTER — Emergency Department (HOSPITAL_COMMUNITY): Payer: BLUE CROSS/BLUE SHIELD

## 2018-03-04 ENCOUNTER — Emergency Department (HOSPITAL_COMMUNITY)
Admission: EM | Admit: 2018-03-04 | Discharge: 2018-03-04 | Disposition: A | Discharge status: Home / Self Care | Payer: BLUE CROSS/BLUE SHIELD | Attending: Emergency Medicine | Admitting: Emergency Medicine

## 2018-03-04 DIAGNOSIS — S92424A Nondisplaced fracture of distal phalanx of right great toe, initial encounter for closed fracture: Secondary | ICD-10-CM | POA: Diagnosis not present

## 2018-03-04 DIAGNOSIS — Z87891 Personal history of nicotine dependence: Secondary | ICD-10-CM | POA: Diagnosis not present

## 2018-03-04 DIAGNOSIS — W109XXA Fall (on) (from) unspecified stairs and steps, initial encounter: Secondary | ICD-10-CM | POA: Insufficient documentation

## 2018-03-04 DIAGNOSIS — Y999 Unspecified external cause status: Secondary | ICD-10-CM | POA: Insufficient documentation

## 2018-03-04 DIAGNOSIS — Y929 Unspecified place or not applicable: Secondary | ICD-10-CM | POA: Diagnosis not present

## 2018-03-04 DIAGNOSIS — S99921A Unspecified injury of right foot, initial encounter: Secondary | ICD-10-CM | POA: Diagnosis present

## 2018-03-04 DIAGNOSIS — Z79899 Other long term (current) drug therapy: Secondary | ICD-10-CM | POA: Insufficient documentation

## 2018-03-04 DIAGNOSIS — Y9301 Activity, walking, marching and hiking: Secondary | ICD-10-CM | POA: Diagnosis not present

## 2018-03-04 DIAGNOSIS — Z91040 Latex allergy status: Secondary | ICD-10-CM | POA: Insufficient documentation

## 2018-03-04 DIAGNOSIS — I10 Essential (primary) hypertension: Secondary | ICD-10-CM | POA: Diagnosis not present

## 2018-03-04 MED ORDER — OXYCODONE-ACETAMINOPHEN 5-325 MG PO TABS: 1.0000 | ORAL_TABLET | Freq: Three times a day (TID) | ORAL | 0 refills | Status: DC | PRN

## 2018-03-04 MED ORDER — OXYCODONE-ACETAMINOPHEN 5-325 MG PO TABS
1.0000 | ORAL_TABLET | Freq: Once | ORAL | Status: AC
  Administered 2018-03-04: 1 via ORAL
  Filled 2018-03-04: qty 1

## 2018-03-04 NOTE — ED Provider Notes (Signed)
[facility] EMERGENCY DEPARTMENT Provider Note   CSN: [id number] Arrival date & time: 03/04/18  1711     History   Chief Complaint Chief Complaint  Patient presents with  . Foot Pain    HPI Lisa Mosley is a 42 y.o. female center for evaluation of right foot pain.  Patient states approximately 4 hours ago, she slipped going down some stairs, and had acute onset right great toe/foot pain.  Since then, patient has been unable to walk due to pain.  Pain is constant, nothing makes it better or worse.  She took 800 mg of ibuprofen without improvement of symptoms.  She has not tried anything else.  No radiation of pain.  She denies numbness or tingling.  Denies injury elsewhere.  She denies hitting her head or loss of consciousness.  She is not on blood thinners.   HPI  Past Medical History:  Diagnosis Date  . HTN (hypertension)   . Migraines   . Nephrolithiasis     Patient Active Problem List   Diagnosis Date Noted  . PVC's (premature ventricular contractions) 07/13/2017  . Fatigue 07/13/2017  . Hypertension 07/13/2017  . Severe dysplasia of cervix 04/17/2015    Past Surgical History:  Procedure Laterality Date  . ABDOMINAL HYSTERECTOMY    . APPENDECTOMY    . CHOLECYSTECTOMY    . HERNIA REPAIR    . TUBAL LIGATION     Now reversed     OB History   None      Home Medications    Prior to Admission medications   Medication Sig Start Date End Date Taking? Authorizing Provider  diltiazem (CARDIZEM CD) 120 MG 24 hr capsule Take 1 capsule (120 mg total) by mouth daily. Please keep upcoming appt in January with Dr. Curt Bears for future refills. Thank you 03/01/18   Constance Haw, MD  escitalopram (LEXAPRO) 10 MG tablet Take 10 mg by mouth daily. 01/22/18   [provider]  flecainide (TAMBOCOR) 100 MG tablet TAKE 1 TABLET TWICE A DAY 02/27/18   Camnitz, Will Hassell Done, MD  losartan (COZAAR) 50 MG tablet TAKE 1 TABLET DAILY 02/27/18    Camnitz, Ocie Doyne, MD  oxyCODONE-acetaminophen (PERCOCET/ROXICET) 5-325 MG tablet Take 1 tablet by mouth every 8 (eight) hours as needed for severe pain. 03/04/18   Braven Wolk, PA-C    Family History Family History  Problem Relation Age of Onset  . Heart attack Father        80s    Social History Social History   Tobacco Use  . Smoking status: Former Smoker    Packs/day: 0.50    Years: 10.00    Pack years: 5.00    Types: Cigarettes  . Smokeless tobacco: Never Used  Substance Use Topics  . Alcohol use: Yes    Comment: Rare  . Drug use: No     Allergies   Nitrofurantoin; Prochlorperazine; Hydrocodone; and Latex   Review of Systems Review of Systems  Musculoskeletal: Positive for arthralgias and joint swelling.  Neurological: Negative for numbness.  Hematological: Does not bruise/bleed easily.     Physical Exam Updated Vital Signs BP (!) 137/103 (BP Location: Left Arm)   Pulse 73   Temp 98.7 F (37.1 C) (Oral)   Resp 16   SpO2 98%   Physical Exam  Constitutional: She is oriented to person, place, and time. She appears well-developed and well-nourished. No distress.  HENT:  Head: Normocephalic and atraumatic.  Eyes: EOM are normal.  Neck: Normal range of motion.  Pulmonary/Chest: Effort normal.  Abdominal: She exhibits no distension.  Musculoskeletal: She exhibits edema and tenderness.  Mild contusion noted at the first MTP.  Tenderness palpation of the right great toe and first MTP.  Tenderness palpation of the distal first metatarsal.  No tenderness palpation elsewhere in the foot.  Pedal pulses intact bilaterally.  Sensation intact bilaterally.  Decreased range of motion of the big toe due to pain.  Full active range of motion of ankle without pain.  No tenderness palpation of the medial or lateral malleolus.   Neurological: She is alert and oriented to person, place, and time. No sensory deficit.  Skin: Skin is warm. Capillary refill takes less  than 2 seconds. No rash noted.  Psychiatric: She has a normal mood and affect.  Nursing note and vitals reviewed.    ED Treatments / Results  Labs (all labs ordered are listed, but only abnormal results are displayed) Labs Reviewed - No data to display  EKG None  Radiology Dg Foot Complete Right  Result Date: 03/04/2018 CLINICAL DATA:  Fall down stairs with foot pain, initial encounter EXAM: RIGHT FOOT COMPLETE - 3+ VIEW COMPARISON:  None. FINDINGS: There is a fracture through the base of the first distal phalanx extending into the articular surface. No other fractures are seen. No soft tissue changes are noted IMPRESSION: Fracture through the base of the first distal phalanx extending into the articular surface. Electronically Signed   By: Inez Catalina M.D.   On: 03/04/2018 17:54    Procedures Procedures (including critical care time)  Medications Ordered in ED Medications  oxyCODONE-acetaminophen (PERCOCET/ROXICET) 5-325 MG per tablet 1 tablet (1 tablet Oral Given 03/04/18 1820)     Initial Impression / Assessment and Plan / ED Course  I have reviewed the triage vital signs and the nursing notes.  Pertinent labs & imaging results that were available during my care of the patient were reviewed by me and considered in my medical decision making (see chart for details).     Pt presenting for evaluation of right foot/great toe pain.  Physical exam shows patient is neurovascularly intact.  Significant tenderness, causing to be hard for her to walk.  X-rays viewed interpreted by me, shows fracture of the first distal phalanx extends through the articular surface.  As there is intra-articular involvement, will place patient in cam walker and crutches.  Discussed follow-up with orthopedics.  Discussed pain control with Tylenol, ibuprofen, and Percocet as needed. PMP checked, pt without controlled substance rx in the past 2 months.  At this time, patient appears safe for discharge.   Return precautions given.  Patient states she understands and agrees plan.   Final Clinical Impressions(s) / ED Diagnoses   Final diagnoses:  Nondisplaced fracture of distal phalanx of right great toe, initial encounter for closed fracture    ED Discharge Orders         Ordered    oxyCODONE-acetaminophen (PERCOCET/ROXICET) 5-325 MG tablet  Every 8 hours PRN     03/04/18 1835           Franchot Heidelberg, PA-C 03/04/18 2213    Virgel Manifold, MD 03/05/18 2350

## 2018-03-04 NOTE — Discharge Instructions (Addendum)
Take ibuprofen 3 times a day with meals.  Do not take other anti-inflammatories at the same time (Advil, Motrin, naproxen, Aleve). You may supplement with Tylenol if you need further pain control. Take percocet as needed for severe or breakthrough pain.  Have caution while making this medicine, as it may make you tired or groggy.  This may increase your risk of falls if you are using crutches. Call orthopedics on Monday for follow-up appointment. Return to the emergency room with any new, worsening, concerning symptoms.

## 2018-03-04 NOTE — ED Triage Notes (Signed)
Pt presents to ED for assessment of right foot pain after she fell down the stairs, scraping the top of her foot on the steps.  Redness, swelling noted.  Small scrape to top of foot as well.

## 2018-03-04 NOTE — ED Notes (Signed)
Patient transported to X-ray 

## 2018-05-02 NOTE — Telephone Encounter (Signed)
Advised to increase Losartan to 100 mg daily, per Dr. Curt Bears.   Pt will continue to monitor BP until she sees [place] this Friday in office.  (will not send in new Rx until we see her Friday, pt is aware of this) Patient verbalized understanding and agreeable to plan.

## 2018-05-04 NOTE — Progress Notes (Signed)
Electrophysiology Office Note   Date:  05/05/2018   ID:  Lisa Mosley, DOB May 24, 1975, MRN [id number]  PCP:  Aretta Nip, MD  Cardiologist:  Angelena Form Primary Electrophysiologist:  Macy Polio Meredith Leeds, MD    No chief complaint on file.    History of Present Illness: Lisa Mosley is a 43 y.o. female who is being seen today for the evaluation of PVCs at the request of [name]. Presenting today for electrophysiology evaluation.  He initially presented to cardiology 06/15/2017 for fatigue and an abnormal EKG.  She was found to have frequent PVCs in the pattern of bigeminy.  She had dizziness but no syncope.  She was started on Cardizem 120 mg.  Echo showed an EF of 55% with grade 1 diastolic dysfunction.  48-hour monitor showed 24,000 PVCs in 48 hours.  Patient continues to have palpitations with Cardizem.  Her blood pressure has been elevated.  Today, denies symptoms of palpitations, chest pain, shortness of breath, orthopnea, PND, lower extremity edema, claudication, dizziness, presyncope, syncope, bleeding, or neurologic sequela. The patient is tolerating medications without difficulties.  Overall she is feeling well.  She has no chest pain or shortness of breath.  She is noted a great decrease in her PVC symptoms.  She did have an episode of syncope around Thanksgiving.  She said that she stood up too fast, got dizzy and had tunnel vision and passed out.  She has had no further episodes.   Past Medical History:  Diagnosis Date  . HTN (hypertension)   . Migraines   . Nephrolithiasis    Past Surgical History:  Procedure Laterality Date  . ABDOMINAL HYSTERECTOMY    . APPENDECTOMY    . CHOLECYSTECTOMY    . HERNIA REPAIR    . TUBAL LIGATION     Now reversed     Current Outpatient Medications  Medication Sig Dispense Refill  . diltiazem (CARDIZEM CD) 120 MG 24 hr capsule Take 1 capsule (120 mg total) by mouth daily. Please keep upcoming appt in January with Dr.  Curt Bears for future refills. Thank you 90 capsule 0  . escitalopram (LEXAPRO) 10 MG tablet Take 10 mg by mouth daily.  2  . flecainide (TAMBOCOR) 100 MG tablet TAKE 1 TABLET TWICE A DAY 180 tablet 3  . losartan (COZAAR) 50 MG tablet TAKE 1 TABLET DAILY 90 tablet 3  . oxyCODONE-acetaminophen (PERCOCET/ROXICET) 5-325 MG tablet Take 1 tablet by mouth every 8 (eight) hours as needed for severe pain. 6 tablet 0   No current facility-administered medications for this visit.     Allergies:   Nitrofurantoin; Prochlorperazine; Hydrocodone; and Latex   Social History:  The patient  reports that she has quit smoking. Her smoking use included cigarettes. She has a 5.00 pack-year smoking history. She has never used smokeless tobacco. She reports current alcohol use. She reports that she does not use drugs.   Family History:  The patient's family history includes Heart attack in her father.   ROS:  Please see the history of present illness.   Otherwise, review of systems is positive for palpitations, cough, dizziness, passing out, headaches.   All other systems are reviewed and negative.   PHYSICAL EXAM: VS:  There were no vitals taken for this visit. , BMI There is no height or weight on file to calculate BMI. GEN: Well nourished, well developed, in no acute distress  HEENT: normal  Neck: no JVD, carotid bruits, or masses Cardiac: RRR; no murmurs, rubs, or gallops,no  edema  Respiratory:  clear to auscultation bilaterally, normal work of breathing GI: soft, nontender, nondistended, + BS MS: no deformity or atrophy  Skin: warm and dry Neuro:  Strength and sensation are intact Psych: euthymic mood, full affect  EKG:  EKG is ordered today. Personal review of the ekg ordered shows sinus rhythm, rate 91  Recent Labs: 06/15/2017: BUN 8; Creatinine, Ser 0.79; Potassium 4.0; Sodium 143; TSH 3.580    Lipid Panel  No results found for: CHOL, TRIG, HDL, CHOLHDL, VLDL, LDLCALC, LDLDIRECT   Wt Readings  from Last 3 Encounters:  01/30/18 208 lb (94.3 kg)  11/01/17 211 lb (95.7 kg)  08/02/17 206 lb (93.4 kg)      Other studies Reviewed: Additional studies/ records that were reviewed today include: TTE 06/23/17  Review of the above records today demonstrates:  - Left ventricle: The cavity size was normal. Wall thickness was   increased in a pattern of mild LVH. The estimated ejection   fraction was 55%. Wall motion was normal; there were no regional   wall motion abnormalities. Doppler parameters are consistent with   abnormal left ventricular relaxation (grade 1 diastolic   dysfunction). - Aortic valve: There was no stenosis. There was trivial   regurgitation. - Mitral valve: Mildly calcified annulus. There was no significant   regurgitation. - Right ventricle: The cavity size was normal. Systolic function   was normal. - Tricuspid valve: Peak RV-RA gradient (S): 15 mm Hg. - Pulmonary arteries: PA peak pressure: 18 mm Hg (S). - Inferior vena cava: The vessel was normal in size. The   respirophasic diameter changes were in the normal range (= 50%),   consistent with normal central venous pressure.  Holter 02/08/18 - personally reviewed Minimum HR: 56 BPM at 4:19:48 AM Maximum HR: 143 BPM at 6:19:33 PM Average HR: 82 BPM Ventricular Beats: 7966 (3%) Supraventricular Beats: 2 (<1%) Sinus rhythm with sinus tachycardia seen. PVCs and PACs noted. Short episodes of trigeminy and bigeminy. Diary events of chest pain associated with sinus rhythm No arrhythmias noted  ASSESSMENT AND PLAN:  1.  PVCs: Wore a cardiac monitor recently that showed a burden of 3% on flecainide.  She is unaware of further episodes of PVCs.  No changes at this time.    2.  Hypertension: Blood pressure well controlled today.  Continue losartan.  3.  Syncope: Appears to be due to orthostatic hypotension.  She stood up and had a tunnel vision sensation.  No further episodes.  Due to the mechanism, I do not feel  like we need to restrict her driving.  No changes.  Current medicines are reviewed at length with the patient today.   The patient does not have concerns regarding her medicines.  The following changes were made today: Decrease diltiazem  Labs/ tests ordered today include:  No orders of the defined types were placed in this encounter.   Disposition:   FU with Lisa Mosley 6 months  Signed, Timika Muench Meredith Leeds, MD  05/05/2018 11:07 AM     [facility] [address] [place] [address] [phone] (office) [phone] (fax)

## 2018-05-05 ENCOUNTER — Encounter: Payer: Self-pay | Admitting: Cardiology

## 2018-05-05 ENCOUNTER — Ambulatory Visit: Payer: BLUE CROSS/BLUE SHIELD | Admitting: Cardiology

## 2018-05-05 VITALS — BP 110/84 | HR 91 | Ht 66.0 in | Wt 215.4 lb

## 2018-05-05 DIAGNOSIS — I10 Essential (primary) hypertension: Secondary | ICD-10-CM

## 2018-05-05 DIAGNOSIS — I493 Ventricular premature depolarization: Secondary | ICD-10-CM | POA: Diagnosis not present

## 2018-05-05 MED ORDER — LOSARTAN POTASSIUM 100 MG PO TABS: 100.0000 mg | ORAL_TABLET | Freq: Every day | ORAL | 3 refills | Status: DC

## 2018-05-05 NOTE — Patient Instructions (Signed)
Medication Instructions:  Your physician recommends that you continue on your current medications as directed. Please refer to the Current Medication list given to you today.  If you need a refill on your cardiac medications before your next appointment, please call your pharmacy.   Labwork: None ordered  Testing/Procedures: None ordered  Follow-Up: Your physician wants you to follow-up in: 6 months with Dr. Camnitz.  You will receive a reminder letter in the mail two months in advance. If you don't receive a letter, please call our office to schedule the follow-up appointment.  Thank you for choosing [facility]!!   Eathan Groman, RN [phone]         

## 2018-08-10 ENCOUNTER — Other Ambulatory Visit: Payer: Self-pay | Admitting: Cardiology

## 2018-08-10 MED ORDER — DILTIAZEM HCL ER COATED BEADS 120 MG PO CP24: 120.0000 mg | ORAL_CAPSULE | Freq: Every day | ORAL | 2 refills | Status: DC

## 2018-10-22 ENCOUNTER — Emergency Department (HOSPITAL_COMMUNITY)
Admission: EM | Admit: 2018-10-22 | Discharge: 2018-10-22 | Disposition: A | Discharge status: Home / Self Care | Payer: BC Managed Care – PPO | Attending: Emergency Medicine | Admitting: Emergency Medicine

## 2018-10-22 ENCOUNTER — Encounter (HOSPITAL_COMMUNITY): Payer: Self-pay

## 2018-10-22 ENCOUNTER — Other Ambulatory Visit: Payer: Self-pay

## 2018-10-22 ENCOUNTER — Emergency Department (HOSPITAL_COMMUNITY): Payer: BC Managed Care – PPO

## 2018-10-22 DIAGNOSIS — I10 Essential (primary) hypertension: Secondary | ICD-10-CM | POA: Insufficient documentation

## 2018-10-22 DIAGNOSIS — K1120 Sialoadenitis, unspecified: Secondary | ICD-10-CM | POA: Diagnosis not present

## 2018-10-22 DIAGNOSIS — Z79899 Other long term (current) drug therapy: Secondary | ICD-10-CM | POA: Insufficient documentation

## 2018-10-22 DIAGNOSIS — R221 Localized swelling, mass and lump, neck: Secondary | ICD-10-CM | POA: Diagnosis present

## 2018-10-22 DIAGNOSIS — Z91040 Latex allergy status: Secondary | ICD-10-CM | POA: Insufficient documentation

## 2018-10-22 DIAGNOSIS — Z87891 Personal history of nicotine dependence: Secondary | ICD-10-CM | POA: Insufficient documentation

## 2018-10-22 LAB — CBC WITH DIFFERENTIAL/PLATELET
Abs Immature Granulocytes: 0.03 10*3/uL (ref 0.00–0.07)
Basophils Absolute: 0.1 10*3/uL (ref 0.0–0.1)
Basophils Relative: 1 %
Eosinophils Absolute: 0.3 10*3/uL (ref 0.0–0.5)
Eosinophils Relative: 3 %
HCT: 45.4 % (ref 36.0–46.0)
Hemoglobin: 15 g/dL (ref 12.0–15.0)
Immature Granulocytes: 0 %
Lymphocytes Relative: 37 %
Lymphs Abs: 4.3 10*3/uL — ABNORMAL HIGH (ref 0.7–4.0)
MCH: 29.5 pg (ref 26.0–34.0)
MCHC: 33 g/dL (ref 30.0–36.0)
MCV: 89.4 fL (ref 80.0–100.0)
Monocytes Absolute: 0.5 10*3/uL (ref 0.1–1.0)
Monocytes Relative: 4 %
Neutro Abs: 6.3 10*3/uL (ref 1.7–7.7)
Neutrophils Relative %: 55 %
Platelets: 252 10*3/uL (ref 150–400)
RBC: 5.08 MIL/uL (ref 3.87–5.11)
RDW: 11.9 % (ref 11.5–15.5)
WBC: 11.4 10*3/uL — ABNORMAL HIGH (ref 4.0–10.5)
nRBC: 0 % (ref 0.0–0.2)

## 2018-10-22 LAB — BASIC METABOLIC PANEL
Anion gap: 11 (ref 5–15)
BUN: 8 mg/dL (ref 6–20)
CO2: 20 mmol/L — ABNORMAL LOW (ref 22–32)
Calcium: 9.1 mg/dL (ref 8.9–10.3)
Chloride: 106 mmol/L (ref 98–111)
Creatinine, Ser: 0.82 mg/dL (ref 0.44–1.00)
GFR calc Af Amer: >60 mL/min (ref >60)
GFR calc non Af Amer: >60 mL/min (ref >60)
Glucose, Bld: 142 mg/dL — ABNORMAL HIGH (ref 70–99)
Potassium: 3.6 mmol/L (ref 3.5–5.1)
Sodium: 137 mmol/L (ref 135–145)

## 2018-10-22 MED ORDER — KETOROLAC TROMETHAMINE 30 MG/ML IJ SOLN
30.0000 mg | Freq: Once | INTRAMUSCULAR | Status: AC
  Administered 2018-10-22: 30 mg via INTRAVENOUS
  Filled 2018-10-22: qty 1

## 2018-10-22 MED ORDER — FAMOTIDINE IN NACL 20-0.9 MG/50ML-% IV SOLN
20.0000 mg | Freq: Once | INTRAVENOUS | Status: AC
  Administered 2018-10-22: 20 mg via INTRAVENOUS
  Filled 2018-10-22: qty 50

## 2018-10-22 MED ORDER — IOHEXOL 300 MG/ML  SOLN
75.0000 mL | Freq: Once | INTRAMUSCULAR | Status: AC | PRN
  Administered 2018-10-22: 75 mL via INTRAVENOUS

## 2018-10-22 MED ORDER — METHYLPREDNISOLONE SODIUM SUCC 125 MG IJ SOLR
80.0000 mg | Freq: Once | INTRAMUSCULAR | Status: AC
  Administered 2018-10-22: 80 mg via INTRAVENOUS
  Filled 2018-10-22: qty 2

## 2018-10-22 NOTE — ED Triage Notes (Signed)
Pt presents with c/o left sided facial swelling along jawline x45 minutes ago after eating dinner. Pt denies any known food allergies. Pt denies trouble breathing, endorses pain when swallowing. Pt A+Ox4 and in NAD on arrival.

## 2018-10-22 NOTE — ED Provider Notes (Addendum)
[facility] EMERGENCY DEPARTMENT Provider Note   CSN: [id number] Arrival date & time: 10/22/18  1811    History   Chief Complaint Chief Complaint  Patient presents with  . Facial Swelling    HPI Lisa Mosley is a 43 y.o. female with history of hypertension, migraines, nephrolithiasis who presents with left-sided neck swelling that began while she was eating.  She reports some discomfort in the area,, but no severe pain.  It is uncomfortable to turn her head to the left.  She denies any tongue swelling, lip swelling, difficulty breathing.  Patient was not eating anything new and has no food allergies.  She is not on an ACE inhibitor and denies any new medications.  Patient denies any dental pain.  She does have braces.  Patient took 50 mg Benadryl prior to arrival.  She denies any chest pain, abdominal pain, nausea, vomiting.     HPI  Past Medical History:  Diagnosis Date  . HTN (hypertension)   . Migraines   . Nephrolithiasis     Patient Active Problem List   Diagnosis Date Noted  . PVC's (premature ventricular contractions) 07/13/2017  . Fatigue 07/13/2017  . Hypertension 07/13/2017  . Severe dysplasia of cervix 04/17/2015    Past Surgical History:  Procedure Laterality Date  . ABDOMINAL HYSTERECTOMY    . APPENDECTOMY    . CHOLECYSTECTOMY    . HERNIA REPAIR    . TUBAL LIGATION     Now reversed     OB History   No obstetric history on file.      Home Medications    Prior to Admission medications   Medication Sig Start Date End Date Taking? Authorizing Provider  diltiazem (CARDIZEM CD) 120 MG 24 hr capsule Take 1 capsule (120 mg total) by mouth daily. 08/10/18   Camnitz, Will Hassell Done, MD  escitalopram (LEXAPRO) 10 MG tablet Take 10 mg by mouth daily. 01/22/18   [provider]  flecainide (TAMBOCOR) 100 MG tablet TAKE 1 TABLET TWICE A DAY 02/27/18   Camnitz, Ocie Doyne, MD  losartan (COZAAR) 100 MG tablet Take 1 tablet (100 mg total)  by mouth daily. 05/05/18 08/03/18  Camnitz, Ocie Doyne, MD  oxyCODONE-acetaminophen (PERCOCET/ROXICET) 5-325 MG tablet Take 1 tablet by mouth every 8 (eight) hours as needed for severe pain. 03/04/18   Caccavale, Sophia, PA-C    Family History Family History  Problem Relation Age of Onset  . Heart attack Father        40s    Social History Social History   Tobacco Use  . Smoking status: Former Smoker    Packs/day: 0.50    Years: 10.00    Pack years: 5.00    Types: Cigarettes  . Smokeless tobacco: Never Used  Substance Use Topics  . Alcohol use: Yes    Comment: Rare  . Drug use: No     Allergies   Nitrofurantoin, Prochlorperazine, Hydrocodone, and Latex   Review of Systems Review of Systems  Constitutional: Negative for chills and fever.  HENT: Positive for facial swelling. Negative for sore throat and trouble swallowing.   Respiratory: Negative for shortness of breath.   Cardiovascular: Negative for chest pain.  Gastrointestinal: Negative for abdominal pain, nausea and vomiting.  Genitourinary: Negative for dysuria.  Musculoskeletal: Positive for neck pain. Negative for back pain.  Skin: Negative for rash and wound.  Neurological: Negative for headaches.  Psychiatric/Behavioral: The patient is not nervous/anxious.      Physical Exam Updated Vital  Signs BP (!) 129/102   Pulse 87   Temp 98.9 F (37.2 C) (Oral)   SpO2 96%   Physical Exam Vitals signs and nursing note reviewed.  Constitutional:      General: She is not in acute distress.    Appearance: She is well-developed. She is not diaphoretic.  HENT:     Head: Normocephalic and atraumatic.     Comments: No significant edema noted to face or other side of the neck    Mouth/Throat:     Pharynx: No oropharyngeal exudate.     Comments: Patent airway, no edema to the tongue or lips Eyes:     General: No scleral icterus.       Right eye: No discharge.        Left eye: No discharge.     Conjunctiva/sclera:  Conjunctivae normal.     Pupils: Pupils are equal, round, and reactive to light.  Neck:     Musculoskeletal: Normal range of motion and neck supple.     Thyroid: No thyromegaly.   Cardiovascular:     Rate and Rhythm: Normal rate and regular rhythm.     Heart sounds: Normal heart sounds. No murmur. No friction rub. No gallop.   Pulmonary:     Effort: Pulmonary effort is normal. No respiratory distress.     Breath sounds: Normal breath sounds. No stridor. No wheezing or rales.  Abdominal:     General: Bowel sounds are normal. There is no distension.     Palpations: Abdomen is soft.     Tenderness: There is no abdominal tenderness. There is no guarding or rebound.  Lymphadenopathy:     Cervical: No cervical adenopathy.  Skin:    General: Skin is warm and dry.     Coloration: Skin is not pale.     Findings: No rash.  Neurological:     Mental Status: She is alert.     Coordination: Coordination normal.      ED Treatments / Results  Labs (all labs ordered are listed, but only abnormal results are displayed) Labs Reviewed  BASIC METABOLIC PANEL - Abnormal; Notable for the following components:      Result Value   CO2 20 (*)    Glucose, Bld 142 (*)    All other components within normal limits  CBC WITH DIFFERENTIAL/PLATELET - Abnormal; Notable for the following components:   WBC 11.4 (*)    Lymphs Abs 4.3 (*)    All other components within normal limits    EKG None  Radiology Ct Soft Tissue Neck W Contrast  Result Date: 10/22/2018 CLINICAL DATA:  LEFT-sided facial swelling. This began after eating dinner. EXAM: CT NECK WITH CONTRAST TECHNIQUE: Multidetector CT imaging of the neck was performed using the standard protocol following the bolus administration of intravenous contrast. CONTRAST:  45mL OMNIPAQUE IOHEXOL 300 MG/ML  SOLN COMPARISON:  None. FINDINGS: Pharynx and larynx: Normal. No mass or swelling. Salivary glands: The area of clinical concern corresponds to the LEFT  parotid gland, marked with a BB. The LEFT-sided gland is mildly enlarged. There is mild fluid surrounding the anterior and inferior most portion of the gland, within the parotid space. Differential considerations include LEFT unilateral parotitis versus recent passage of a stone, but no calculi nor calcifications can be seen within the gland. Submandibular glands are normal. Thyroid: Normal. Lymph nodes: None enlarged or abnormal density. Vascular: Negative. Limited intracranial: Negative. Visualized orbits: Negative. Mastoids and visualized paranasal sinuses: Clear. Skeleton: No acute or  aggressive process. Upper chest: Negative. Other: None. IMPRESSION: The LEFT parotid gland is mildly enlarged, and there is mild fluid surrounding the anterior and inferior most portions of the gland. Considerations include LEFT unilateral parotitis versus recent passage of a stone. See discussion above. Electronically Signed   By: Staci Righter M.D.   On: 10/22/2018 20:22    Procedures Procedures (including critical care time)  Medications Ordered in ED Medications  famotidine (PEPCID) IVPB 20 mg premix (0 mg Intravenous Stopped 10/22/18 1930)  methylPREDNISolone sodium succinate (SOLU-MEDROL) 125 mg/2 mL injection 80 mg (80 mg Intravenous Given 10/22/18 1900)  iohexol (OMNIPAQUE) 300 MG/ML solution 75 mL (75 mLs Intravenous Contrast Given 10/22/18 1956)  ketorolac (TORADOL) 30 MG/ML injection 30 mg (30 mg Intravenous Given 10/22/18 2052)     Initial Impression / Assessment and Plan / ED Course  I have reviewed the triage vital signs and the nursing notes.  Pertinent labs & imaging results that were available during my care of the patient were reviewed by me and considered in my medical decision making (see chart for details).        Patient presenting with acute onset left-sided neck swelling while eating.  CT soft tissue of the neck shows mildly enlarged left parotid gland with some mild fluid surrounding the  anterior and inferior most portions which could represent unilateral parotitis or recent passage of a stone.  Considering patient is afebrile, has minimal pain to the area, no redness, no abscess, I feel most likely passage of stone is more likely considering this began while patient was eating.  Low suspicion of allergic reaction considering very localized swelling.  There is no difficulty breathing, swelling of the lips, tongue, or throat.  Initially, patient given Pepcid and Solu-Medrol following Benadryl at home, however low suspicion of acute allergic reaction.  We will treat supportively with NSAID, warm compress, sour candy and follow-up to ENT.  However, patient will be given strict return precautions including fever, redness, increasing pain or swelling.  Patient understands and agrees with plan.  Patient vital stable throughout ED course and discharged in satisfactory condition. I discussed patient case with Dr. Billy Fischer who guided the patient's management and agrees with plan.   Final Clinical Impressions(s) / ED Diagnoses   Final diagnoses:  Parotitis    ED Discharge Orders    None           Caryl Ada 10/22/18 2125    Gareth Morgan, MD 10/23/18 2159

## 2018-10-22 NOTE — Discharge Instructions (Addendum)
Take ibuprofen 600 mg every 6 hours. Use warm compress 3-4 times daily alternating 10 minutes on, 10 minutes off.  Eat sour candy a few times daily.  Please follow-up with Dr. Wilburn Cornelia with ENT if your symptoms are not improving over the next few days.  Please return the emergency department immediately if develop fever of 100.4, increasing pain, redness, swelling, or any other new or concerning symptoms.

## 2019-04-16 NOTE — Progress Notes (Signed)
Cardiology Office Note Date:  04/18/2019  Patient ID:  Lisa Mosley, Lisa Mosley Jun 10, 1975, MRN [id number] PCP:  Lisa Nip, MD  Cardiologist:  Dr. Angelena Mosley Electrophysiologist: Dr. Curt Mosley     Chief Complaint:  over due visit, medicine refill  History of Present Illness: Lisa Mosley is a 43 y.o. female with history of HTN, PVCs  She was referred to Dr. Angelena Mosley Feb 2019 with c/o fatigue, and EKG noted V bigeminy.  Planned for her norvasc changed to dilt, planned for an echo, labs, and monitoring. Echo showed an EF of 55% with grade 1 diastolic dysfunction.  48-hour monitor showed 24,000 PVCs in 48 hours.  Referred to Dr. Eston Mosley 2019, noting ongoing palpitations despite diltiazem and BB. Discussed ablation, though she wanted to avoid procedures and started on flecainide.  She comes in today to be seen for Dr. Curt Mosley, last seen by him Jan 2020.  She had much improvement I her palpitations, a f/u monitor with PVC burden only 3%.  She had a syncopal event a few months previously that sounded orthostatic.  No changes were made and planned for 6 mo follow up.  She has had a tough year "like everyone", she is a Glass blower/designer at [employer] and this has been challenging, though more so, recently her son's Lisa Mosley (who died of suicide 3 years ago), this time always very difficult for her and her daughter this year having much difficulty with this, and had to be hospitalized.  She worries about her very much.  She does not exercise, but her and her daughter have decided to start a yoga class to help them both physically and hopefully emotionally as well.  She reports her palpitations on again, off again.  She will occasionally feel them and this is anxiety provoking for her that then tends to bring them on more.  She has recently been feeling dizzy upon standing, and generally nauseous, her PMD has found glucose in her urine and further w/u is in progress.   She denies fainting,  No dizziness  while seated or supine.  No syncope   She is not particularly aware of her PVCs today.  Tired today though She mentions an occ CP, this is central, non-radiating, described as a sting that is quick/fleeting, lasting only a second, not positional or exertional with no associated symptoms.  She thinks this may be GI.  PVCs AAD Flecainide started April 2019 is current   Past Medical History:  Diagnosis Date  . HTN (hypertension)   . Migraines   . Nephrolithiasis     Past Surgical History:  Procedure Laterality Date  . ABDOMINAL HYSTERECTOMY    . APPENDECTOMY    . CHOLECYSTECTOMY    . HERNIA REPAIR    . TUBAL LIGATION     Now reversed    Current Outpatient Medications  Medication Sig Dispense Refill  . diltiazem (CARDIZEM CD) 120 MG 24 hr capsule Take 1 capsule (120 mg total) by mouth daily. 90 capsule 2  . escitalopram (LEXAPRO) 10 MG tablet Take 10 mg by mouth daily.  2  . flecainide (TAMBOCOR) 100 MG tablet TAKE 1 TABLET TWICE A DAY 180 tablet 3  . losartan (COZAAR) 100 MG tablet Take 1 tablet (100 mg total) by mouth daily. 90 tablet 3   No current facility-administered medications for this visit.    Allergies:   Nitrofurantoin, Prochlorperazine, Hydrocodone, and Latex   Social History:  The patient  reports that she has quit smoking. Her smoking use included  cigarettes. She has a 5.00 pack-year smoking history. She has never used smokeless tobacco. She reports current alcohol use. She reports that she does not use drugs.   Family History:  The patient's family history includes Heart attack in her father.  ROS:  Please see the history of present illness.    All other systems are reviewed and otherwise negative.   PHYSICAL EXAM:  VS:  BP 126/78   Pulse (!) 102   Ht 5\' 6"  (1.676 m)   Wt 233 lb (105.7 kg)   BMI 37.61 kg/m  BMI: Body mass index is 37.61 kg/m. Well nourished, well developed, in no acute distress  HEENT: normocephalic, atraumatic  Neck: no JVD,  carotid bruits or masses Cardiac:  Regularly -irregular; no significant murmurs, no rubs, or gallops Lungs:   CTA b/l , no wheezing, rhonchi or rales  Abd: soft, nontender, obese MS: no deformity or atrophy Ext:  no edema  Skin: warm and dry, no rash Neuro:  No gross deficits appreciated Psych: euthymic mood, full affect    EKG:  Done today and reviewed by myself shows SR 100bom, PVCs   Holter 02/08/18  Minimum HR: 56 BPM at 4:19:48 AM Maximum HR: 143 BPM at 6:19:33 PM Average HR: 82 BPM Ventricular Beats: 7966 (3%) Supraventricular Beats: 2 (<1%) Sinus rhythm with sinus tachycardia seen. PVCs and PACs noted. Short episodes of trigeminy and bigeminy. Diary events of chest pain associated with sinus rhythm No arrhythmias noted   08/17/2017: ETT  The patient walked for 8 minutes on a standard Bruce protocol treadmill test. She achieved a peak heart rate of 164 which is 92% predicted maximal heart rate. At peak exercise there were no ST or T wave changes to suggest ischemia  There was no significant QRS widening at peak exercise.  Blood pressure demonstrated a normal response to exercise.  This is interpreted as a negative stress test. There is no evidence of ischemia. There is no QRS widening at peak exercise.  TTE 06/23/17  Review of the above records today demonstrates:  - Left ventricle: The cavity size was normal. Wall thickness was increased in a pattern of mild LVH. The estimated ejection fraction was 55%. Wall motion was normal; there were no regional wall motion abnormalities. Doppler parameters are consistent with abnormal left ventricular relaxation (grade 1 diastolic dysfunction). - Aortic valve: There was no stenosis. There was trivial regurgitation. - Mitral valve: Mildly calcified annulus. There was no significant regurgitation. - Right ventricle: The cavity size was normal. Systolic function was normal. - Tricuspid valve: Peak RV-RA gradient  (S): 15 mm Hg. - Pulmonary arteries: PA peak pressure: 18 mm Hg (S). - Inferior vena cava: The vessel was normal in size. The respirophasic diameter changes were in the normal range (= 50%), consistent with normal central venous pressure.  Holter 06/30/17  Sinus rhythm  Frequent premature ventricular contractions (24,548 during 48 hour monitoring period) often in pattern of bigeminy Rare premature atrial contractions (2 during monitoring period)    Recent Labs: 10/22/2018: BUN 8; Creatinine, Ser 0.82; Hemoglobin 15.0; Platelets 252; Potassium 3.6; Sodium 137  No results found for requested labs within last 8760 hours.   CrCl cannot be calculated (Patient's most recent lab result is older than the maximum 21 days allowed.).   Wt Readings from Last 3 Encounters:  04/18/19 233 lb (105.7 kg)  05/05/18 215 lb 6.4 oz (97.7 kg)  01/30/18 208 lb (94.3 kg)     Other studies reviewed: Additional studies/records  reviewed today include: summarized above  ASSESSMENT AND PLAN:  1. PVCs     on flecainide and diltiazem   She is having PVCs today, rhythm strip with bigeminy, and noted bigemeny intermittently with her exam as well Acute increase in personal stress of late, ? If this plays a role in the re-emergence of her PVCs  She is looking to start yoga, and her semester is over with some down time coming from work   Will plan to have her wear a 48hour monitor to see her PVC burden, if this is a transient up-tick or failure of flecainide at this juncture I mentioned that we can revisit EPS, ablation, she would like to avoid procedures  She would like to start with the monitor, aks it be after the holidays (another stressor for her).  2. HTN     Looks OK  3. Obesity     She has gained      18lbs this year, as above, she and her daughter with plans to start yoga  4. Orthoststic dizziness     No syncope     Recent find of glucose in her urine, PMD is working up  Today Supine  136/97, 51bpm (presumed bigeminy) Sitting 128/81, 53bpm Standing 131/83, 54bpm  She feels like she drinks a good amount of water Will continue w/u with her PMD  5. CP     Very atypical sounding, she has PVCs on her EKG though no ischemic looking changes, no significant changes from older     No ischemia on her ETT last year    Disposition: F/u with monitoring and f/u with Dr. Curt Mosley in 6-8 weeks, sooner if needed.  She is asked to let us now if her PVCs, dizziness increase, CP escalate in any way.   Current medicines are reviewed at length with the patient today.  The patient did not have any concerns regarding medicines.  Venetia Night, PA-C 04/18/2019 10:09 AM     [facility] [place] [address] [phone] (office)  [phone] (fax)

## 2019-04-18 ENCOUNTER — Ambulatory Visit: Payer: BC Managed Care – PPO | Admitting: Physician Assistant

## 2019-04-18 ENCOUNTER — Other Ambulatory Visit: Payer: Self-pay

## 2019-04-18 VITALS — BP 126/78 | HR 102 | Ht 66.0 in | Wt 233.0 lb

## 2019-04-18 DIAGNOSIS — R0789 Other chest pain: Secondary | ICD-10-CM

## 2019-04-18 DIAGNOSIS — I493 Ventricular premature depolarization: Secondary | ICD-10-CM

## 2019-04-18 DIAGNOSIS — R002 Palpitations: Secondary | ICD-10-CM

## 2019-04-18 DIAGNOSIS — I10 Essential (primary) hypertension: Secondary | ICD-10-CM | POA: Diagnosis not present

## 2019-04-18 DIAGNOSIS — R42 Dizziness and giddiness: Secondary | ICD-10-CM | POA: Diagnosis not present

## 2019-04-18 NOTE — Patient Instructions (Signed)
Medication Instructions:   Your physician recommends that you continue on your current medications as directed. Please refer to the Current Medication list given to you today.  *If you need a refill on your cardiac medications before your next appointment, please call your pharmacy*  Lab Work: [place]   If you have labs (blood work) drawn today and your tests are completely normal, you will receive your results only by: Marland Kitchen MyChart Message (if you have MyChart) OR . A paper copy in the mail If you have any lab test that is abnormal or we need to change your treatment, we will call you to review the results.  Testing/Procedures: Your physician has recommended that you wear a holter monitor. Holter monitors are medical devices that record the heart's electrical activity. Doctors most often use these monitors to diagnose arrhythmias. Arrhythmias are problems with the speed or rhythm of the heartbeat. The monitor is a small, portable device. You can wear one while you do your normal daily activities. This is usually used to diagnose what is causing palpitations/syncope (passing out). ; Follow-Up: At [facility], you and your health needs are our priority.  As part of our continuing mission to provide you with exceptional heart care, we have created designated Provider Care Teams.  These Care Teams include your primary Cardiologist (physician) and Advanced Practice Providers (APPs -  Physician Assistants and Nurse Practitioners) who all work together to provide you with the care you need, when you need it.  Your next appointment:    6-8  week(s)  The format for your next appointment:   In Person  Provider:   You may see Will Meredith Leeds, MD  (ONLY)  Other Instructions

## 2019-04-18 NOTE — Addendum Note (Signed)
Addended by: Claude Manges on: 04/18/2019 10:30 AM   Modules accepted: Orders

## 2019-04-19 ENCOUNTER — Telehealth: Payer: Self-pay | Admitting: Radiology

## 2019-04-19 NOTE — Telephone Encounter (Signed)
Enrolled patient for a 3 day Zio monitor to be mailed to patients home.   48hr changed to 3 day Zio for mailing purposes during covid.

## 2019-04-30 ENCOUNTER — Other Ambulatory Visit (INDEPENDENT_AMBULATORY_CARE_PROVIDER_SITE_OTHER): Payer: BC Managed Care – PPO

## 2019-04-30 DIAGNOSIS — R002 Palpitations: Secondary | ICD-10-CM | POA: Diagnosis not present

## 2019-04-30 DIAGNOSIS — I493 Ventricular premature depolarization: Secondary | ICD-10-CM

## 2019-05-11 ENCOUNTER — Other Ambulatory Visit: Payer: Self-pay | Admitting: Cardiology

## 2019-05-18 ENCOUNTER — Other Ambulatory Visit: Payer: Self-pay | Admitting: Cardiology

## 2019-06-05 ENCOUNTER — Encounter: Payer: Self-pay | Admitting: Cardiology

## 2019-06-05 ENCOUNTER — Other Ambulatory Visit: Payer: Self-pay

## 2019-06-05 ENCOUNTER — Ambulatory Visit: Payer: BC Managed Care – PPO | Admitting: Cardiology

## 2019-06-05 VITALS — BP 110/72 | HR 93 | Ht 66.0 in | Wt 233.0 lb

## 2019-06-05 DIAGNOSIS — I493 Ventricular premature depolarization: Secondary | ICD-10-CM | POA: Diagnosis not present

## 2019-06-05 NOTE — Progress Notes (Signed)
Electrophysiology Office Note   Date:  06/05/2019   ID:  Lisa Mosley, DOB 1975-11-28, MRN [id number]  PCP:  Aretta Nip, MD  Cardiologist:  Angelena Form Primary Electrophysiologist:  Karielle Davidow Meredith Leeds, MD    No chief complaint on file.    History of Present Illness: Lisa Mosley is a 44 y.o. female who is being seen today for the evaluation of PVCs at the request of [name]. Presenting today for electrophysiology evaluation.  He initially presented to cardiology 06/15/2017 for fatigue and an abnormal EKG.  She was found to have frequent PVCs in the pattern of bigeminy.  She had dizziness but no syncope.  She was started on Cardizem 120 mg.  Echo showed an EF of 55% with grade 1 diastolic dysfunction.  48-hour monitor showed 24,000 PVCs in 48 hours.  Patient continues to have palpitations with Cardizem.  Her blood pressure has been elevated.  Today, denies symptoms of palpitations, chest pain, shortness of breath, orthopnea, PND, lower extremity edema, claudication, dizziness, presyncope, syncope, bleeding, or neurologic sequela. The patient is tolerating medications without difficulties.  She continues to have intermittent palpitations.  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  She currently feels well otherwise.  She does have quite a bit of anxiety in her life with her home life, and the upcoming anniversary of her son's death.     Past Medical History:  Diagnosis Date  . HTN (hypertension)   . Migraines   . Nephrolithiasis    Past Surgical History:  Procedure Laterality Date  . ABDOMINAL HYSTERECTOMY    . APPENDECTOMY    . CHOLECYSTECTOMY    . HERNIA REPAIR    . TUBAL LIGATION     Now reversed     Current Outpatient Medications  Medication Sig Dispense Refill  . diltiazem (CARDIZEM CD) 120 MG 24 hr capsule Take 1 capsule (120 mg total) by mouth daily. 90  capsule 0  . escitalopram (LEXAPRO) 10 MG tablet Take 10 mg by mouth daily.  2  . flecainide (TAMBOCOR) 100 MG tablet TAKE 1 TABLET TWICE A DAY 180 tablet 0  . losartan (COZAAR) 100 MG tablet TAKE 1 TABLET BY MOUTH EVERY DAY 30 tablet 5   No current facility-administered medications for this visit.    Allergies:   Nitrofurantoin, Prochlorperazine, Hydrocodone, and Latex   Social History:  The patient  reports that she has quit smoking. Her smoking use included cigarettes. She has a 5.00 pack-year smoking history. She has never used smokeless tobacco. She reports current alcohol use. She reports that she does not use drugs.   Family History:  The patient's family history includes Heart attack in her father.   ROS:  Please see the history of present illness.   Otherwise, review of systems is positive for none.   All other systems are reviewed and negative.   PHYSICAL EXAM: VS:  BP 110/72   Pulse 93   Ht 5\' 6"  (1.676 m)   Wt 233 lb (105.7 kg)   SpO2 99%   BMI 37.61 kg/m  , BMI Body mass index is 37.61 kg/m. GEN: Well nourished, well developed, in no acute distress  HEENT: normal  Neck: no JVD, carotid bruits, or masses Cardiac: RRR; no murmurs, rubs, or gallops,no edema  Respiratory:  clear to auscultation bilaterally, normal work of breathing GI: soft, nontender, nondistended, + BS MS:  no deformity or atrophy  Skin: warm and dry Neuro:  Strength and sensation are intact Psych: euthymic mood, full affect  EKG:  EKG is ordered today. Personal review of the ekg ordered shows sinus rhythm, rate 93  Recent Labs: 10/22/2018: BUN 8; Creatinine, Ser 0.82; Hemoglobin 15.0; Platelets 252; Potassium 3.6; Sodium 137    Lipid Panel  No results found for: CHOL, TRIG, HDL, CHOLHDL, VLDL, LDLCALC, LDLDIRECT   Wt Readings from Last 3 Encounters:  06/05/19 233 lb (105.7 kg)  04/18/19 233 lb (105.7 kg)  05/05/18 215 lb 6.4 oz (97.7 kg)      Other studies Reviewed: Additional studies/  records that were reviewed today include: TTE 06/23/17  Review of the above records today demonstrates:  - Left ventricle: The cavity size was normal. Wall thickness was   increased in a pattern of mild LVH. The estimated ejection   fraction was 55%. Wall motion was normal; there were no regional   wall motion abnormalities. Doppler parameters are consistent with   abnormal left ventricular relaxation (grade 1 diastolic   dysfunction). - Aortic valve: There was no stenosis. There was trivial   regurgitation. - Mitral valve: Mildly calcified annulus. There was no significant   regurgitation. - Right ventricle: The cavity size was normal. Systolic function   was normal. - Tricuspid valve: Peak RV-RA gradient (S): 15 mm Hg. - Pulmonary arteries: PA peak pressure: 18 mm Hg (S). - Inferior vena cava: The vessel was normal in size. The   respirophasic diameter changes were in the normal range (= 50%),   consistent with normal central venous pressure.  Cardiac monitor 05/20/2019 personally reviewed Max 157 bpm 11:51am, 12/30 Min 67 bpm 07:15am, 12/31 Avg 101 bpm 25.2% PVCs Rare PACs Predominant rhythm sinus rhythm  ASSESSMENT AND PLAN:  1.  PVCs: Recent cardiac monitor showed an increased burden at 25%.  It is unclear to me whether or not she is symptomatic from this or her anxiety.  Her ECG today shows no PVCs.  We [name] continue to monitor without change.  2.  Hypertension: Currently well controlled  3.  Syncope: No further episodes  Current medicines are reviewed at length with the patient today.   The patient does not have concerns regarding her medicines.  The following changes were made today: None  Labs/ tests ordered today include:  Orders Placed This Encounter  Procedures  . EKG 12-Lead    Disposition:   FU  6 months  Signed, Mckinzey Entwistle Meredith Leeds, MD  06/05/2019 3:37 PM     [facility] [address] [place] [address] [phone] (office)  [phone] (fax)

## 2019-07-12 ENCOUNTER — Ambulatory Visit: Payer: BC Managed Care – PPO | Attending: Internal Medicine

## 2019-07-12 DIAGNOSIS — Z23 Encounter for immunization: Secondary | ICD-10-CM

## 2019-07-12 NOTE — Progress Notes (Signed)
   Covid-19 Vaccination Clinic  Name:  Kambrya Toal    MRN: [id number] DOB: [birth year]  07/12/2019  Ms. Neira was observed post Covid-19 immunization for 15 minutes without incident. She was provided with Vaccine Information Sheet and instruction to access the V-Safe system.   Ms. Iwai was instructed to call 911 with any severe reactions post vaccine: Marland Kitchen Difficulty breathing  . Swelling of face and throat  . A fast heartbeat  . A bad rash all over body  . Dizziness and weakness   Immunizations Administered    Name Date Dose VIS Date Route   Pfizer COVID-19 Vaccine 07/12/2019  8:57 AM 0.3 mL 04/13/2019 Intramuscular   Manufacturer: [place]   Lot: [id number]   [place]: [id number]

## 2019-07-13 ENCOUNTER — Ambulatory Visit: Payer: BC Managed Care – PPO

## 2019-07-17 ENCOUNTER — Other Ambulatory Visit: Payer: Self-pay

## 2019-07-17 ENCOUNTER — Encounter: Payer: Self-pay | Admitting: Student

## 2019-07-17 ENCOUNTER — Telehealth: Payer: Self-pay | Admitting: *Deleted

## 2019-07-17 ENCOUNTER — Ambulatory Visit: Payer: BC Managed Care – PPO | Admitting: Student

## 2019-07-17 VITALS — BP 118/94 | HR 66 | Ht 66.0 in | Wt 234.0 lb

## 2019-07-17 DIAGNOSIS — R5383 Other fatigue: Secondary | ICD-10-CM | POA: Diagnosis not present

## 2019-07-17 DIAGNOSIS — I10 Essential (primary) hypertension: Secondary | ICD-10-CM

## 2019-07-17 DIAGNOSIS — G4730 Sleep apnea, unspecified: Secondary | ICD-10-CM | POA: Diagnosis not present

## 2019-07-17 DIAGNOSIS — I493 Ventricular premature depolarization: Secondary | ICD-10-CM

## 2019-07-17 DIAGNOSIS — R29818 Other symptoms and signs involving the nervous system: Secondary | ICD-10-CM

## 2019-07-17 NOTE — Patient Instructions (Signed)
Medication Instructions:  none *If you need a refill on your cardiac medications before your next appointment, please call your pharmacy*   Lab Work: none If you have labs (blood work) drawn today and your tests are completely normal, you will receive your results only by: Marland Kitchen MyChart Message (if you have MyChart) OR . A paper copy in the mail If you have any lab test that is abnormal or we need to change your treatment, we will call you to review the results.   Testing/Procedures:  Someone will call you Your physician has recommended that you have a sleep study. This test records several body functions during sleep, including: brain activity, eye movement, oxygen and carbon dioxide blood levels, heart rate and rhythm, breathing rate and rhythm, the flow of air through your mouth and nose, snoring, body muscle movements, and chest and belly movement.     Follow-Up: At [facility], you and your health needs are our priority.  As part of our continuing mission to provide you with exceptional heart care, we have created designated Provider Care Teams.  These Care Teams include your primary Cardiologist (physician) and Advanced Practice Providers (APPs -  Physician Assistants and Nurse Practitioners) who all work together to provide you with the care you need, when you need it.  We recommend signing up for the patient portal called "MyChart".  Sign up information is provided on this After Visit Summary.  MyChart is used to connect with patients for Virtual Visits (Telemedicine).  Patients are able to view lab/test results, encounter notes, upcoming appointments, etc.  Non-urgent messages can be sent to your provider as well.   To learn more about what you can do with MyChart, go to [email or web address].    Your next appointment:   3 month(s)  The format for your next appointment:   Either In Person or Virtual  Provider:   Dr Curt Bears   Other Instructions

## 2019-07-17 NOTE — Telephone Encounter (Signed)
Staff message sent to Gae Bon per [employer] no PA is required for HST. Ok to schedule. Call reference [id number].

## 2019-07-17 NOTE — Telephone Encounter (Signed)
-----   Message from Frederik Schmidt, RN sent at 07/17/2019  9:18 AM EDT ----- Regarding: Schedule Home Sleep Study Please schedule Home Sleep Study per Oda Kilts, PA  Suspected Sleep Apnea  Thank you.Lisa Mosley

## 2019-07-17 NOTE — Progress Notes (Signed)
PCP:  Aretta Nip, MD Primary Cardiologist: Lauree Chandler, MD Electrophysiologist: Dr. Jacqulyn Liner Aronson is a 44 y.o. female with past medical history of PVCs and HTN who presents today for routine electrophysiology followup. They are seen for Dr. Curt Bears.   Since last being seen in our clinic, the patient reports doing OK. Several weeks ago she was having frequent headaches and saw her PCP (with [facility]). She reports her HR was elevated and her BP was in the 170s. They increased her diltiazem to 180 mg daily.  Her headaches have improved, and with them her BP. Her HRs remain slightly elevated.  She denies lightheadedness or dizziness. She does snore heavily, and her husband has told her she needs a sleep study before (he has had one). 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    She does feel slightly more fatigued on higher dose diltiazem.  Past Medical History:  Diagnosis Date  . HTN (hypertension)   . Migraines   . Nephrolithiasis    Past Surgical History:  Procedure Laterality Date  . ABDOMINAL HYSTERECTOMY    . APPENDECTOMY    . CHOLECYSTECTOMY    . HERNIA REPAIR    . TUBAL LIGATION     Now reversed    Current Outpatient Medications  Medication Sig Dispense Refill  . diltiazem (DILTIAZEM CD) 180 MG 24 hr capsule Take 1 capsule by mouth daily.    Marland Kitchen escitalopram (LEXAPRO) 10 MG tablet Take 10 mg by mouth daily.  2  . flecainide (TAMBOCOR) 100 MG tablet TAKE 1 TABLET TWICE A DAY 180 tablet 0  . losartan (COZAAR) 100 MG tablet TAKE 1 TABLET BY MOUTH EVERY DAY 30 tablet 5   No current facility-administered medications for this visit.    Allergies  Allergen Reactions  . Nitrofurantoin Rash  . Prochlorperazine     Other reaction(s): Other Bugs crawling Other reaction(s): Other  . Hydrocodone Itching  . Latex Rash    Social History   Socioeconomic History  . Marital status: Married    Spouse  name: Not on file  . Number of children: 3  . Years of education: Not on file  . Highest education level: Not on file  Occupational History  . Occupation: Student [employer]  Tobacco Use  . Smoking status: Former Smoker    Packs/day: 0.50    Years: 10.00    Pack years: 5.00    Types: Cigarettes  . Smokeless tobacco: Never Used  Substance and Sexual Activity  . Alcohol use: Yes    Comment: Rare  . Drug use: No  . Sexual activity: Not on file  Other Topics Concern  . Not on file  Social History Narrative  . Not on file   Social Determinants of Health   Financial Resource Strain:   . Difficulty of Paying Living Expenses:   Food Insecurity:   . Worried About Charity fundraiser in the Last Year:   . Arboriculturist in the Last Year:   Transportation Needs:   . Film/video editor (Medical):   Marland Kitchen Lack of Transportation (Non-Medical):   Physical Activity:   . Days of Exercise per Week:   . Minutes of Exercise per Session:   Stress:   . Feeling of Stress :   Social Connections:   . Frequency of Communication with Friends and Family:   . Frequency of Social Gatherings with Friends and Family:   .  Attends Religious Services:   . Active Member of Clubs or Organizations:   . Attends Archivist Meetings:   Marland Kitchen Marital Status:   Intimate Partner Violence:   . Fear of Current or Ex-Partner:   . Emotionally Abused:   Marland Kitchen Physically Abused:   . Sexually Abused:      Review of Systems: General: No chills, fever, night sweats or weight changes  Cardiovascular:  No chest pain, dyspnea on exertion, edema, orthopnea, palpitations, paroxysmal nocturnal dyspnea Dermatological: No rash, lesions or masses Respiratory: No cough, dyspnea Urologic: No hematuria, dysuria Abdominal: No nausea, vomiting, diarrhea, bright red blood per rectum, melena, or hematemesis Neurologic: No visual changes, weakness, changes in mental status All other systems reviewed and are otherwise negative  except as noted above.  Physical Exam: Vitals:   07/17/19 0854  BP: (!) 118/94  Pulse: 66  SpO2: 97%  Weight: 234 lb (106.1 kg)  Height: 5\' 6"  (1.676 m)    GEN- The patient is well appearing, alert and oriented x 3 today.   HEENT: normocephalic, atraumatic; sclera clear, conjunctiva pink; hearing intact; oropharynx clear; neck supple, no JVP Lymph- no cervical lymphadenopathy Lungs- Clear to ausculation bilaterally, normal work of breathing.  No wheezes, rales, rhonchi Heart- Regular rate and rhythm, no murmurs, rubs or gallops, PMI not laterally displaced GI- soft, non-tender, non-distended, bowel sounds present, no hepatosplenomegaly Extremities- no clubbing, cyanosis, or edema; DP/PT/radial pulses 2+ bilaterally MS- no significant deformity or atrophy Skin- warm and dry, no rash or lesion Psych- euthymic mood, full affect Neuro- strength and sensation are intact  EKG is ordered. Personal review of EKG from today shows NSR (nearly tach) at 99 bpm with normal intervals  Assessment and Plan:  1. PVCs Previous monitor showed burden of 25% Unclear if these were/are symptomatic.  ECG today shows no PVCs LVEF 55% by TTE 06/2017 Continue flecainide 100 mg BID. Will get labs from PCP  2. HTN Continue losartan 100 mg daily Continue diltiazem 180 mg daily.   3. Syncope No further  4. Suspected sleep apnea She snores loudly, has daytime fatigue, HTN, and a BMI > 35. She is indicated for sleep study testing  5. Obesity Body mass index is 37.77 kg/m.  Encouraged activity as tolerated and weight loss. Sleep study recommended as above.   Pt relatively stable today on increased diltiazem. She is not interested in increasing further. Will focus on lifestyle modifications including sleep study testing for now. RTC 3 months. Sooner with worsening symptoms.    Shirley Friar, PA-C  07/17/19 9:08 AM

## 2019-07-26 NOTE — Telephone Encounter (Signed)
Patient is aware and agreeable to Home Sleep Study through [facility]. Patient is scheduled for 09/24/19 at 1 pm to pick up home sleep kit and meet with Respiratory therapist at [facility]. Patient is aware that if this appointment date and time does not work for them they should contact Artis Delay directly at [phone]. Patient is aware that a sleep packet will be sent from [facility] in week. Patient is agreeable to treatment and thankful for call

## 2019-07-26 NOTE — Telephone Encounter (Signed)
RE: Schedule Home Sleep Study  Lauralee Evener, CMA  Freada Bergeron, CMA  Ok to schedule HST. Per patient's BCBS no PA is required. Call reference # [id number].

## 2019-08-06 ENCOUNTER — Ambulatory Visit: Payer: BC Managed Care – PPO | Attending: Internal Medicine

## 2019-08-06 DIAGNOSIS — Z23 Encounter for immunization: Secondary | ICD-10-CM

## 2019-08-06 NOTE — Progress Notes (Signed)
   Covid-19 Vaccination Clinic  Name:  Lisa Mosley    MRN: [id number] DOB: 1976/01/06  08/06/2019  [name] was observed post Covid-19 immunization for 15 minutes without incident. She was provided with Vaccine Information Sheet and instruction to access the V-Safe system.   [name] was instructed to call 911 with any severe reactions post vaccine: Marland Kitchen Difficulty breathing  . Swelling of face and throat  . A fast heartbeat  . A bad rash all over body  . Dizziness and weakness   Immunizations Administered    Name Date Dose VIS Date Route   Pfizer COVID-19 Vaccine 08/06/2019  2:19 PM 0.3 mL 04/13/2019 Intramuscular   Manufacturer: [place]   Lot: [id number]   [place]: [id number]

## 2019-08-09 ENCOUNTER — Other Ambulatory Visit: Payer: Self-pay | Admitting: Cardiology

## 2019-09-24 ENCOUNTER — Other Ambulatory Visit: Payer: Self-pay

## 2019-09-24 ENCOUNTER — Ambulatory Visit (HOSPITAL_BASED_OUTPATIENT_CLINIC_OR_DEPARTMENT_OTHER): Payer: BC Managed Care – PPO | Attending: Student | Admitting: Cardiology

## 2019-09-24 DIAGNOSIS — G4733 Obstructive sleep apnea (adult) (pediatric): Secondary | ICD-10-CM | POA: Diagnosis not present

## 2019-09-24 DIAGNOSIS — R0902 Hypoxemia: Secondary | ICD-10-CM | POA: Diagnosis not present

## 2019-09-24 DIAGNOSIS — G4730 Sleep apnea, unspecified: Secondary | ICD-10-CM | POA: Diagnosis present

## 2019-09-24 DIAGNOSIS — I493 Ventricular premature depolarization: Secondary | ICD-10-CM | POA: Diagnosis not present

## 2019-09-24 DIAGNOSIS — R5383 Other fatigue: Secondary | ICD-10-CM | POA: Diagnosis not present

## 2019-09-24 DIAGNOSIS — I10 Essential (primary) hypertension: Secondary | ICD-10-CM | POA: Insufficient documentation

## 2019-09-25 ENCOUNTER — Other Ambulatory Visit: Payer: Self-pay

## 2019-09-25 MED ORDER — LOSARTAN POTASSIUM 100 MG PO TABS: 100.0000 mg | ORAL_TABLET | Freq: Every day | ORAL | 3 refills | Status: DC

## 2019-09-25 NOTE — Procedures (Signed)
    Patient Name: Lisa Mosley, Lisa Mosley Date: 09/24/2019 Gender: Female D.O.B: 10/01/1975 Age (years): 43 Referring Provider: Barrington Ellison PA-C Height (inches): 77 Interpreting Physician: Fransico Him MD, ABSM Weight (lbs): 227 RPSGT: Jacolyn Reedy BMI: 37 MRN: [id number] Neck Size: 14.50  CLINICAL INFORMATION Sleep Study Type: HST  Indication for sleep study: N/A  Epworth Sleepiness Score: 10  SLEEP STUDY TECHNIQUE A multi-channel overnight portable sleep study was performed. The channels recorded were: nasal airflow, thoracic respiratory movement, and oxygen saturation with a pulse oximetry. Snoring was also monitored.  MEDICATIONS Patient self administered medications include: N/A.  SLEEP ARCHITECTURE Patient was studied for 526.9 minutes. The sleep efficiency was 99.4 % and the patient was supine for 75.9%. The arousal index was 0.0 per hour.  RESPIRATORY PARAMETERS The overall AHI was 66.7 per hour, with a central apnea index of 0.0 per hour.  The oxygen nadir was 71% during sleep.  CARDIAC DATA Mean heart rate during sleep was 77.0 bpm.  IMPRESSIONS - Severe obstructive sleep apnea occurred during this study (AHI = 66.7/h). - No significant central sleep apnea occurred during this study (CAI = 0.0/h). - Severe oxygen desaturation was noted during this study (Min O2 = 71%). - Patient snored 44.7% during the sleep.  DIAGNOSIS - Obstructive Sleep Apnea (327.23 [G47.33 ICD-10]) - Nocturnal Hypoxemia (327.26 [G47.36 ICD-10])  RECOMMENDATIONS - Recommend in lab CPAP titration due to severity of OSA and hypoxemia.  - Avoid alcohol, sedatives and other CNS depressants that may worsen sleep apnea and disrupt normal sleep architecture. - Sleep hygiene should be reviewed to assess factors that may improve sleep quality. - Weight management and regular exercise should be initiated or continued.  [Electronically signed] 09/25/2019 01:45 PM  Fransico Him MD,  ABSM Diplomate, American Board of Sleep Medicine

## 2019-10-04 ENCOUNTER — Telehealth: Payer: Self-pay | Admitting: *Deleted

## 2019-10-04 DIAGNOSIS — G4730 Sleep apnea, unspecified: Secondary | ICD-10-CM

## 2019-10-04 NOTE — Telephone Encounter (Signed)
Informed patient of sleep study results and patient understanding was verbalized. Patient understands his sleep study showed they have sleep apnea and recommend CPAP titration. Please set up titration in the sleep lab.   Titration sent to sleep pool  

## 2019-10-04 NOTE — Telephone Encounter (Signed)
-----   Message from Sueanne Margarita, MD sent at 09/25/2019  1:47 PM EDT ----- Please let patient know that they have sleep apnea and recommend CPAP titration. Please set up titration in the sleep lab.

## 2019-11-12 NOTE — Telephone Encounter (Signed)
Patient is calling to follow up in regards to CPAP titration. She states she is not sure what needs to be done next regarding sleep apnea. Please call.

## 2019-11-12 NOTE — Telephone Encounter (Signed)
Hello Mrs. Amore, your titration was sent to be precerted, at that time we were behind on precerts but we are now getting caught back up. I hope to be calling you with a date soon.

## 2019-12-20 NOTE — Telephone Encounter (Signed)
Follow up    Pt is calling back to follow up, she asked if there's any update with her pre certification since she's been waiting since May.

## 2019-12-20 NOTE — Addendum Note (Signed)
Addended by: Campbell Riches on: 12/20/2019 12:49 PM   Modules accepted: Orders

## 2019-12-20 NOTE — Telephone Encounter (Signed)
Per Dr. Landis Gandy recommendation patient is to have an in lab CPAP titration. I do not see order in nor do I see where patient has been contacted to follow up. Order will be placed today and message sent to precert

## 2019-12-25 ENCOUNTER — Telehealth: Payer: Self-pay

## 2019-12-25 NOTE — Telephone Encounter (Signed)
Initiated Prior Auth on 12/21/19 and was told that medical necessities would need to be sent to complete authorization.  Case number - [id number]  12/21/19--03/22/20 approval date range  Medical Necessities faxed today. Awaiting response

## 2019-12-28 NOTE — Telephone Encounter (Signed)
CPAP TITRATION PRIOR AUTH:  PER CONSTANCE at [employer]BS : CPAPTitration has been [place] to schedule titration valid dates are 12/21/19-06/22/2020 aut #?

## 2019-12-28 NOTE — Telephone Encounter (Signed)
CPAP TITRATION PRIOR AUTH:  PER CONSTANCE at [employer] : CPAPTitration has been [place] to schedule titration valid dates are 12/21/19-06/22/2020 aut #?

## 2019-12-28 NOTE — Telephone Encounter (Signed)
FW: CPAP Titration Bobby Rumpf, CMA  Freada Bergeron, CMA Approval for CPAP Titration from 12/21/19 - 06/22/20  Ref # [id number]  Ok to schedule

## 2019-12-31 NOTE — Telephone Encounter (Signed)
Patient is scheduled for CPAP Titration on 01/30/20. Patient understands her titration study will be done at [facility] sleep lab. Patient understands she will receive a letter in a week or so detailing appointment, date, time, and location. Patient understands to call if she does not receive the letter  in a timely manner. Left detailed message on voicemail with date and time of titration and informed patient to call back to confirm or reschedule.

## 2020-01-04 NOTE — Telephone Encounter (Signed)
Patient called today to give new insurance information because her job has changed. I had the new insurance scanned in and will cancel her pending sleep study until her new insurance has been precerted.

## 2020-01-14 ENCOUNTER — Other Ambulatory Visit: Payer: Self-pay | Admitting: Cardiology

## 2020-01-15 ENCOUNTER — Ambulatory Visit: Payer: BC Managed Care – PPO | Admitting: Cardiology

## 2020-01-30 ENCOUNTER — Encounter (HOSPITAL_BASED_OUTPATIENT_CLINIC_OR_DEPARTMENT_OTHER): Payer: BC Managed Care – PPO | Admitting: Cardiology

## 2020-02-25 NOTE — Progress Notes (Signed)
PCP:  Aretta Nip, MD Primary Cardiologist: Lauree Chandler, MD Electrophysiologist: Will Meredith Leeds, MD   Lisa Mosley is a 44 y.o. female seen today for Will Meredith Leeds, MD for routine electrophysiology followup.  Since last being seen in our clinic the patient reports doing very well. There is a national CPAP shortage. She had a positive sleep study in May and is still awaiting titration.  she denies chest pain, palpitations, dyspnea, PND, orthopnea, nausea, vomiting, dizziness, syncope, edema, weight gain, or early satiety.  Past Medical History:  Diagnosis Date  . HTN (hypertension)   . Migraines   . Nephrolithiasis    Past Surgical History:  Procedure Laterality Date  . ABDOMINAL HYSTERECTOMY    . APPENDECTOMY    . CHOLECYSTECTOMY    . HERNIA REPAIR    . TUBAL LIGATION     Now reversed    Current Outpatient Medications  Medication Sig Dispense Refill  . diltiazem (DILTIAZEM CD) 180 MG 24 hr capsule Take 1 capsule by mouth daily.    Marland Kitchen escitalopram (LEXAPRO) 10 MG tablet Take 10 mg by mouth daily.  2  . flecainide (TAMBOCOR) 100 MG tablet TAKE 1 TABLET TWICE A DAY 180 tablet 3  . losartan (COZAAR) 100 MG tablet TAKE 1 TABLET BY MOUTH EVERY DAY 90 tablet 1   No current facility-administered medications for this visit.    Allergies  Allergen Reactions  . Nitrofurantoin Rash  . Prochlorperazine     Other reaction(s): Other Bugs crawling Other reaction(s): Other  . Hydrocodone Itching  . Latex Rash    Social History   Socioeconomic History  . Marital status: Married    Spouse name: Not on file  . Number of children: 3  . Years of education: Not on file  . Highest education level: Not on file  Occupational History  . Occupation: Student [employer]  Tobacco Use  . Smoking status: Former Smoker    Packs/day: 0.50    Years: 10.00    Pack years: 5.00    Types: Cigarettes  . Smokeless tobacco: Never Used  Vaping Use  . Vaping Use: Never used    Substance and Sexual Activity  . Alcohol use: Yes    Comment: Rare  . Drug use: No  . Sexual activity: Not on file  Other Topics Concern  . Not on file  Social History Narrative  . Not on file   Social Determinants of Health   Financial Resource Strain:   . Difficulty of Paying Living Expenses: Not on file  Food Insecurity:   . Worried About Charity fundraiser in the Last Year: Not on file  . Ran Out of Food in the Last Year: Not on file  Transportation Needs:   . Lack of Transportation (Medical): Not on file  . Lack of Transportation (Non-Medical): Not on file  Physical Activity:   . Days of Exercise per Week: Not on file  . Minutes of Exercise per Session: Not on file  Stress:   . Feeling of Stress : Not on file  Social Connections:   . Frequency of Communication with Friends and Family: Not on file  . Frequency of Social Gatherings with Friends and Family: Not on file  . Attends Religious Services: Not on file  . Active Member of Clubs or Organizations: Not on file  . Attends Archivist Meetings: Not on file  . Marital Status: Not on file  Intimate Partner Violence:   . Fear of  Current or Ex-Partner: Not on file  . Emotionally Abused: Not on file  . Physically Abused: Not on file  . Sexually Abused: Not on file     Review of Systems: General: No chills, fever, night sweats or weight changes  Cardiovascular:  No chest pain, dyspnea on exertion, edema, orthopnea, palpitations, paroxysmal nocturnal dyspnea Dermatological: No rash, lesions or masses Respiratory: No cough, dyspnea Urologic: No hematuria, dysuria Abdominal: No nausea, vomiting, diarrhea, bright red blood per rectum, melena, or hematemesis Neurologic: No visual changes, weakness, changes in mental status All other systems reviewed and are otherwise negative except as noted above.  Physical Exam: Vitals:   02/26/20 0815  BP: 116/80  Pulse: 77  SpO2: 97%  Weight: 218 lb (98.9 kg)   Height: 5\' 6"  (1.676 m)    GEN- The patient is well appearing, alert and oriented x 3 today.   HEENT: normocephalic, atraumatic; sclera clear, conjunctiva pink; hearing intact; oropharynx clear; neck supple, no JVP Lymph- no cervical lymphadenopathy Lungs- Clear to ausculation bilaterally, normal work of breathing.  No wheezes, rales, rhonchi Heart- Regular rate and rhythm, no murmurs, rubs or gallops, PMI not laterally displaced GI- soft, non-tender, non-distended, bowel sounds present, no hepatosplenomegaly Extremities- no clubbing, cyanosis, or edema; DP/PT/radial pulses 2+ bilaterally MS- no significant deformity or atrophy Skin- warm and dry, no rash or lesion Psych- euthymic mood, full affect Neuro- strength and sensation are intact  EKG is ordered. Personal review of EKG from today shows NSR at 77 bpm, QRS 92 ms, PR interval 164 ms  Additional studies reviewed include: Previous EP office notes  Assessment and Plan:  1. PVCs Previous monitor showed burden of 25% Unclear if these were/are symptomatic.  ECG today with NSR and no PVCs  LVEF 55% by TTE 06/2017 Continue flecainide 100 mg BID. Stable  2. HTN Continue losartan 100 mg daily Continue diltiazem 180 mg daily.  Continue current regimen PCP labs 12/03/2019 with Cr 0.86 and K 4.4  3. Syncope No further.  4. OSA  She is scheduled for CPAP titration. National CPAP shortage has hindered this.   5. Obesity Body mass index is 35.19 kg/m.  Encouraged activity as tolerated and weight loss.  CPAP as above  Shirley Friar, PA-C  02/26/20 8:21 AM

## 2020-02-26 ENCOUNTER — Other Ambulatory Visit: Payer: Self-pay

## 2020-02-26 ENCOUNTER — Ambulatory Visit (INDEPENDENT_AMBULATORY_CARE_PROVIDER_SITE_OTHER): Payer: BC Managed Care – PPO | Admitting: Student

## 2020-02-26 ENCOUNTER — Encounter: Payer: Self-pay | Admitting: Student

## 2020-02-26 VITALS — BP 116/80 | HR 77 | Ht 66.0 in | Wt 218.0 lb

## 2020-02-26 DIAGNOSIS — I493 Ventricular premature depolarization: Secondary | ICD-10-CM

## 2020-02-26 DIAGNOSIS — G4730 Sleep apnea, unspecified: Secondary | ICD-10-CM

## 2020-02-26 DIAGNOSIS — R5383 Other fatigue: Secondary | ICD-10-CM

## 2020-02-26 DIAGNOSIS — I10 Essential (primary) hypertension: Secondary | ICD-10-CM

## 2020-02-26 NOTE — Patient Instructions (Addendum)
Medication Instructions:  *If you need a refill on your cardiac medications before your next appointment, please call your pharmacy*  Follow-Up: At [facility], you and your health needs are our priority.  As part of our continuing mission to provide you with exceptional heart care, we have created designated Provider Care Teams.  These Care Teams include your primary Cardiologist (physician) and Advanced Practice Providers (APPs -  Physician Assistants and Nurse Practitioners) who all work together to provide you with the care you need, when you need it.  We recommend signing up for the patient portal called "MyChart".  Sign up information is provided on this After Visit Summary.  MyChart is used to connect with patients for Virtual Visits (Telemedicine).  Patients are able to view lab/test results, encounter notes, upcoming appointments, etc.  Non-urgent messages can be sent to your provider as well.   To learn more about what you can do with MyChart, go to [email or web address].    Your next appointment:   Your physician recommends that you schedule a follow-up appointment in: [place] with Dr. Curt Bears  The format for your next appointment:   In Person with Allegra Lai, MD

## 2020-02-26 NOTE — Addendum Note (Signed)
Addended by: Jeremy Johann on: 02/26/2020 08:37 AM   Modules accepted: Orders

## 2020-03-06 MED ORDER — FLECAINIDE ACETATE 100 MG PO TABS: 100.0000 mg | ORAL_TABLET | Freq: Two times a day (BID) | ORAL | 3 refills | Status: DC

## 2020-07-13 ENCOUNTER — Other Ambulatory Visit: Payer: Self-pay | Admitting: Cardiology

## 2020-07-19 ENCOUNTER — Emergency Department (HOSPITAL_COMMUNITY): Payer: BC Managed Care – PPO

## 2020-07-19 ENCOUNTER — Encounter (HOSPITAL_COMMUNITY): Payer: Self-pay | Admitting: Emergency Medicine

## 2020-07-19 ENCOUNTER — Emergency Department (HOSPITAL_COMMUNITY)
Admission: EM | Admit: 2020-07-19 | Discharge: 2020-07-19 | Disposition: A | Discharge status: Home / Self Care | Payer: BC Managed Care – PPO | Attending: Emergency Medicine | Admitting: Emergency Medicine

## 2020-07-19 DIAGNOSIS — R221 Localized swelling, mass and lump, neck: Secondary | ICD-10-CM | POA: Diagnosis not present

## 2020-07-19 DIAGNOSIS — Z87891 Personal history of nicotine dependence: Secondary | ICD-10-CM | POA: Diagnosis not present

## 2020-07-19 DIAGNOSIS — I10 Essential (primary) hypertension: Secondary | ICD-10-CM | POA: Diagnosis not present

## 2020-07-19 DIAGNOSIS — Z8669 Personal history of other diseases of the nervous system and sense organs: Secondary | ICD-10-CM | POA: Diagnosis not present

## 2020-07-19 DIAGNOSIS — R519 Headache, unspecified: Secondary | ICD-10-CM | POA: Diagnosis not present

## 2020-07-19 DIAGNOSIS — M542 Cervicalgia: Secondary | ICD-10-CM | POA: Insufficient documentation

## 2020-07-19 DIAGNOSIS — Z91040 Latex allergy status: Secondary | ICD-10-CM | POA: Insufficient documentation

## 2020-07-19 DIAGNOSIS — Z79899 Other long term (current) drug therapy: Secondary | ICD-10-CM | POA: Diagnosis not present

## 2020-07-19 HISTORY — DX: Ventricular premature depolarization: I49.3

## 2020-07-19 LAB — I-STAT CHEM 8, ED
BUN: 16 mg/dL (ref 6–20)
Calcium, Ion: 1.2 mmol/L (ref 1.15–1.40)
Chloride: 107 mmol/L (ref 98–111)
Creatinine, Ser: 0.7 mg/dL (ref 0.44–1.00)
Glucose, Bld: 94 mg/dL (ref 70–99)
HCT: 37 % (ref 36.0–46.0)
Hemoglobin: 12.6 g/dL (ref 12.0–15.0)
Potassium: 4.1 mmol/L (ref 3.5–5.1)
Sodium: 140 mmol/L (ref 135–145)
TCO2: 24 mmol/L (ref 22–32)

## 2020-07-19 MED ORDER — FENTANYL CITRATE (PF) 100 MCG/2ML IJ SOLN
50.0000 ug | Freq: Once | INTRAMUSCULAR | Status: AC
  Administered 2020-07-19: 50 ug via INTRAVENOUS
  Filled 2020-07-19: qty 2

## 2020-07-19 MED ORDER — NAPROXEN 500 MG PO TABS: 500.0000 mg | ORAL_TABLET | Freq: Two times a day (BID) | ORAL | 0 refills | Status: DC

## 2020-07-19 MED ORDER — SUMATRIPTAN SUCCINATE 50 MG PO TABS: 50.0000 mg | ORAL_TABLET | ORAL | 0 refills | Status: DC | PRN

## 2020-07-19 MED ORDER — IOHEXOL 300 MG/ML  SOLN
100.0000 mL | Freq: Once | INTRAMUSCULAR | Status: AC | PRN
  Administered 2020-07-19: 100 mL via INTRAVENOUS

## 2020-07-19 NOTE — ED Provider Notes (Addendum)
[facility] EMERGENCY DEPARTMENT Provider Note   CSN: [id number] Arrival date & time: 07/19/20  1107     History No chief complaint on file.   Lisa Mosley is a 45 y.o. female.  HPI  45 year old female history of PVCs, hypertension, migraines presents today complaining of headache that began several days ago.  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  It is in the center of her head sharp and pressure-like.  After this began she reports there was some swelling in her left supraclavicular area.  She was seen in it was thought to be a swollen lymph node.  Denies any fever, chills, Covid exposure, Covid symptoms, history of anticoagulation, history of aneurysm or family history of intracranial hemorrhage.  She states that she has had migraines in the past but has not had migraine headaches since she had a hysterectomy.  She has been taking ibuprofen without relief.  Pain is 7 out of 10.     Past Medical History:  Diagnosis Date  . HTN (hypertension)   . Migraines   . Nephrolithiasis   . PVC (premature ventricular contraction)     Patient Active Problem List   Diagnosis Date Noted  . PVC's (premature ventricular contractions) 07/13/2017  . Fatigue 07/13/2017  . Hypertension 07/13/2017  . Severe dysplasia of cervix 04/17/2015    Past Surgical History:  Procedure Laterality Date  . ABDOMINAL HYSTERECTOMY    . APPENDECTOMY    . CHOLECYSTECTOMY    . HERNIA REPAIR    . TUBAL LIGATION     Now reversed     OB History   No obstetric history on file.     Family History  Problem Relation Age of Onset  . Heart attack Father        53s    Social History   Tobacco Use  . Smoking status: Former Smoker    Packs/day: 0.50    Years: 10.00    Pack years: 5.00    Types: Cigarettes  . Smokeless tobacco: Never Used  Vaping Use  . Vaping Use: Never used  Substance Use Topics  . Alcohol use: Yes    Comment: Rare  . Drug use: No    Home Medications Prior  to Admission medications   Medication Sig Start Date End Date Taking? Authorizing Provider  diltiazem (DILTIAZEM CD) 180 MG 24 hr capsule Take 1 capsule by mouth daily. 06/26/19   [provider]  escitalopram (LEXAPRO) 10 MG tablet Take 10 mg by mouth daily. 01/22/18   [provider]  flecainide (TAMBOCOR) 100 MG tablet Take 1 tablet (100 mg total) by mouth 2 (two) times daily. 03/06/20   Camnitz, Will Hassell Done, MD  losartan (COZAAR) 100 MG tablet TAKE 1 TABLET BY MOUTH EVERY DAY 07/15/20   Camnitz, Ocie Doyne, MD    Allergies    Nitrofurantoin, Prochlorperazine, Hydrocodone, and Latex  Review of Systems   Review of Systems  All other systems reviewed and are negative.   Physical Exam Updated Vital Signs BP 117/79   Pulse 89   Temp 98.3 F (36.8 C) (Oral)   Resp 19   SpO2 100%   Physical Exam Vitals and nursing note reviewed.  HENT:     Head: Normocephalic.     Right Ear: External ear normal.     Left Ear: External ear normal.     Mouth/Throat:     Pharynx: Oropharynx is clear.  Eyes:     Extraocular Movements: Extraocular movements  intact.     Pupils: Pupils are equal, round, and reactive to light.  Cardiovascular:     Rate and Rhythm: Normal rate and regular rhythm.     Pulses: Normal pulses.  Pulmonary:     Effort: Pulmonary effort is normal.  Abdominal:     General: Abdomen is flat.     Palpations: Abdomen is soft.  Musculoskeletal:        General: Normal range of motion.     Cervical back: Normal range of motion and neck supple.  Skin:    General: Skin is warm and dry.     Capillary Refill: Capillary refill takes less than 2 seconds.  Neurological:     General: No focal deficit present.     Mental Status: Mental status is at baseline.     Cranial Nerves: No cranial nerve deficit.     Sensory: No sensory deficit.     Motor: No weakness.     Coordination: Coordination normal.     Deep Tendon Reflexes: Reflexes normal.  Psychiatric:         Mood and Affect: Mood normal.     ED Results / Procedures / Treatments   Labs (all labs ordered are listed, but only abnormal results are displayed) Labs Reviewed - No data to display  EKG None  Radiology No results found.  Procedures Procedures   Medications Ordered in ED Medications  fentaNYL (SUBLIMAZE) injection 50 mcg (has no administration in time range)    ED Course  I have reviewed the triage vital signs and the nursing notes.  Pertinent labs & imaging results that were available during my care of the patient were reviewed by me and considered in my medical decision making (see chart for details).    MDM Rules/Calculators/A&P                          Patient with headache and neck pain for the past several days.  Onset was fairly acute, but headache is pressure and not pounding.  She has had some neck pain with left supraclavicular swelling. Doubt hemmorhage, infection, no trauama.  CT head and neck are pending.  Discussed care with Dr. Sabra Heck and he will evaluate after CT scan. Final Clinical Impression(s) / ED Diagnoses Final diagnoses:  Acute intractable headache, unspecified headache type  Neck swelling    Rx / DC Orders ED Discharge Orders    None       Pattricia Boss, MD 07/19/20 1620    Pattricia Boss, MD 07/19/20 1622

## 2020-07-19 NOTE — Discharge Instructions (Signed)
The CT scan of your brain and your neck were both normal.  Please take the following medications  Naproxen twice a day for the next several days, then as needed twice a day  You may take Imitrex once and repeat in 2 hours if still having a headache, the maximum dose of 2 pills/day  If you should develop severe or worsening symptoms return to the emergency department, otherwise see your doctor within the week for recheck

## 2020-07-19 NOTE — ED Triage Notes (Addendum)
Pt states she went to the [facility] and was sent to ED for further eval.  Reports headache x 3 days and pain to L neck.  States she was told she had a swollen lymph node on her L clavicle.  No arm drift.

## 2020-07-19 NOTE — ED Provider Notes (Signed)
This patient was signed out at change of shift by Dr. Jeanell Sparrow, the patient on my exam is awake alert and has a slight recurrent headache after going into the CT scan machine.  Neurologically she appears unremarkable, her CT scans of the head and the neck appear totally normal without any signs of abnormalities.  She has been prescribed naproxen and Imitrex to be used appropriately, she is agreeable to return should symptoms worsen, stable for discharge at this time  Vitals:   07/19/20 1400 07/19/20 1425 07/19/20 1426 07/19/20 1734  BP: 117/79  117/79 (!) 146/97  Pulse:   77 80  Resp: 18 19 19 17   Temp:      TempSrc:      SpO2: 97% 100% 100% 100%      Noemi Chapel, MD 07/19/20 1810

## 2020-07-19 NOTE — ED Notes (Signed)
ED Provider Dr. Jeanell Sparrow at bedside.

## 2020-09-02 ENCOUNTER — Other Ambulatory Visit: Payer: Self-pay

## 2020-09-02 ENCOUNTER — Ambulatory Visit: Payer: BC Managed Care – PPO | Admitting: Podiatry

## 2020-09-02 ENCOUNTER — Ambulatory Visit (INDEPENDENT_AMBULATORY_CARE_PROVIDER_SITE_OTHER): Payer: BC Managed Care – PPO

## 2020-09-02 DIAGNOSIS — M722 Plantar fascial fibromatosis: Secondary | ICD-10-CM

## 2020-09-02 MED ORDER — TRIAMCINOLONE ACETONIDE 40 MG/ML IJ SUSP
20.0000 mg | Freq: Once | INTRAMUSCULAR | Status: AC
  Administered 2020-09-02: 20 mg

## 2020-09-02 MED ORDER — MELOXICAM 15 MG PO TABS: 15.0000 mg | ORAL_TABLET | Freq: Every day | ORAL | 3 refills | Status: DC

## 2020-09-02 MED ORDER — METHYLPREDNISOLONE 4 MG PO TBPK: ORAL_TABLET | ORAL | 0 refills | Status: DC

## 2020-09-02 NOTE — Progress Notes (Signed)
  Subjective:  Patient ID: Lisa Mosley, female    DOB: 1976/03/18,  MRN: [id number] HPI No chief complaint on file.   45 y.o. female presents with the above complaint.   ROS: Denies fever chills nausea vomiting muscle aches pains calf pain back pain chest pain shortness of breath.  Past Medical History:  Diagnosis Date  . HTN (hypertension)   . Migraines   . Nephrolithiasis   . PVC (premature ventricular contraction)    Past Surgical History:  Procedure Laterality Date  . ABDOMINAL HYSTERECTOMY    . APPENDECTOMY    . CHOLECYSTECTOMY    . HERNIA REPAIR    . TUBAL LIGATION     Now reversed    Current Outpatient Medications:  .  diltiazem (CARDIZEM CD) 180 MG 24 hr capsule, Take 1 capsule by mouth daily., Disp: , Rfl:  .  escitalopram (LEXAPRO) 10 MG tablet, Take 10 mg by mouth at bedtime., Disp: , Rfl: 2 .  flecainide (TAMBOCOR) 100 MG tablet, Take 1 tablet (100 mg total) by mouth 2 (two) times daily., Disp: 180 tablet, Rfl: 3 .  losartan (COZAAR) 100 MG tablet, TAKE 1 TABLET BY MOUTH EVERY DAY, Disp: 90 tablet, Rfl: 1 .  meloxicam (MOBIC) 15 MG tablet, Take 1 tablet (15 mg total) by mouth daily., Disp: 30 tablet, Rfl: 3 .  methylPREDNISolone (MEDROL DOSEPAK) 4 MG TBPK tablet, 6 day dose pack - take as directed, Disp: 21 tablet, Rfl: 0  Allergies  Allergen Reactions  . Nitrofurantoin Rash  . Prochlorperazine     Other reaction(s): Other Bugs crawling Other reaction(s): Other  . Hydrocodone Itching  . Latex Rash   Review of Systems Objective:  There were no vitals filed for this visit.  General: Well developed, nourished, in no acute distress, alert and oriented x3   Dermatological: Skin is warm, dry and supple bilateral. Nails x 10 are well maintained; remaining integument appears unremarkable at this time. There are no open sores, no preulcerative lesions, no rash or signs of infection present.  Vascular: Dorsalis Pedis artery and Posterior Tibial artery pedal  pulses are 2/4 bilateral with immedate capillary fill time. Pedal hair growth present. No varicosities and no lower extremity edema present bilateral.   Neruologic: Grossly intact via light touch bilateral. Vibratory intact via tuning fork bilateral. Protective threshold with Semmes Wienstein monofilament intact to all pedal sites bilateral. Patellar and Achilles deep tendon reflexes 2+ bilateral. No Babinski or clonus noted bilateral.   Musculoskeletal: No gross boney pedal deformities bilateral. No pain, crepitus, or limitation noted with foot and ankle range of motion bilateral. Muscular strength 5/5 in all groups tested bilateral.  Pain to palpation Nupercaine tubercle right heel.  Gait: Unassisted, Nonantalgic.    Radiographs:  Radiographs taken today demonstrate a small plantar and posterior calcaneal heel spur with soft tissue increase in density plantar fascial pain insertion site of the right heel.  Assessment & Plan:   Assessment: Planter fasciitis right.  Plan: Discussed etiology pathology conservative surgical therapies injected the right heel 20 mg Kenalog 5 mg Marcaine.  Tolerated procedure well.  Start her on a Medrol Dosepak to be followed by meloxicam.  Placed her in a plantar fascial brace to be followed by a night splint.  Discussed appropriate shoe gear stretching exercise ice therapy and shoe gear modifications.     Hagop Mccollam T. [place], [place]

## 2020-09-15 ENCOUNTER — Telehealth: Payer: Self-pay | Admitting: Cardiology

## 2020-09-15 NOTE — Telephone Encounter (Signed)
  Pt c/o of Chest Pain: STAT if CP now or developed within 24 hours  1. Are you having CP right now? no  2. Are you experiencing any other symptoms (ex. SOB, nausea, vomiting, sweating)? fatigue  3. How long have you been experiencing CP? Started Friday 09/12/20  4. Is your CP continuous or coming and going? Comes and goes, episodes can last up to 20 mins  5. Have you taken Nitroglycerin? no ? Patient has appt on 09/22/20

## 2020-09-15 NOTE — Telephone Encounter (Signed)
Spoke with pt who states she has had 3 intermittent episodes of chest pain since last Friday lasting about 20 minutes. Pt states it is a squeezing sensation. Nothing specific brought on the CP and nothing in particular relieves it. Her only other complaint is fatigue.  She reports she is taking medications as prescribed.  Pt has been scheduled to see Oda Kilts, PA-C 09/24/2020.  Reviewed ED precautions.  Will forward information to Dr Curt Bears to make him aware as well.  Pt verbalizes understanding and agrees with current plan.

## 2020-09-21 ENCOUNTER — Telehealth: Payer: BC Managed Care – PPO | Admitting: Physician Assistant

## 2020-09-21 ENCOUNTER — Encounter: Payer: Self-pay | Admitting: Physician Assistant

## 2020-09-21 DIAGNOSIS — U071 COVID-19: Secondary | ICD-10-CM

## 2020-09-21 DIAGNOSIS — R059 Cough, unspecified: Secondary | ICD-10-CM

## 2020-09-21 MED ORDER — BENZONATATE 100 MG PO CAPS: 100.0000 mg | ORAL_CAPSULE | Freq: Two times a day (BID) | ORAL | 0 refills | Status: DC | PRN

## 2020-09-21 NOTE — Progress Notes (Signed)
  E-Visit  for Positive Covid Test Result  We are sorry you are not feeling well. We are here to help!  You have tested positive for COVID-19, meaning that you were infected with the novel coronavirus and could give the virus to others.  It is vitally important that you stay home so you do not spread it to others.      Please continue isolation at home, for at least 10 days since the start of your symptoms and until you have had 24 hours with no fever (without taking a fever reducer) and with improving of symptoms.  If you have no symptoms but tested positive (or all symptoms resolve after 5 days and you have no fever) you can leave your house but continue to wear a mask around others for an additional 5 days. If you have a fever,continue to stay home until you have had 24 hours of no fever. Most cases improve 5-10 days from onset but we have seen a small number of patients who have gotten worse after the 10 days.  Please be sure to watch for worsening symptoms and remain taking the proper precautions.   Go to the nearest hospital ED for assessment if fever/cough/breathlessness are severe or illness seems like a threat to life.    The following symptoms may appear 2-14 days after exposure: Fever Cough Shortness of breath or difficulty breathing Chills Repeated shaking with chills Muscle pain Headache Sore throat New loss of taste or smell Fatigue Congestion or runny nose Nausea or vomiting Diarrhea  You have been enrolled in MyChart Home Monitoring for COVID-19. Daily you will receive a questionnaire within the MyChart website. Our COVID-19 response team will be monitoring your responses daily.  You can use medication such as prescription cough medication called Tessalon Perles 100 mg. You may take 1-2 capsules every 8 hours as needed for cough and prescription for Fluticasone nasal spray 2 sprays in each nostril one time per day  You may also take acetaminophen (Tylenol) as needed  for fever.  HOME CARE: Only take medications as instructed by your medical team. Drink plenty of fluids and get plenty of rest. A steam or ultrasonic humidifier can help if you have congestion.   GET HELP RIGHT AWAY IF YOU HAVE EMERGENCY WARNING SIGNS.  Call 911 or proceed to your closest emergency facility if: You develop worsening high fever. Trouble breathing Bluish lips or face Persistent pain or pressure in the chest New confusion Inability to wake or stay awake You cough up blood. Your symptoms become more severe Inability to hold down food or fluids  This list is not all possible symptoms. Contact your medical provider for any symptoms that are severe or concerning to you.    Your e-visit answers were reviewed by a board certified advanced clinical practitioner to complete your personal care plan.  Depending on the condition, your plan could have included both over the counter or prescription medications.  If there is a problem please reply once you have received a response from your provider.  Your safety is important to us.  If you have drug allergies check your prescription carefully.    You can use MyChart to ask questions about today's visit, request a non-urgent call back, or ask for a work or school excuse for 24 hours related to this e-Visit. If it has been greater than 24 hours you will need to follow up with your provider, or enter a new e-Visit to address those   greater than 24 hours you will need to follow up with your provider, or enter a new e-Visit to address those concerns. You will get an e-mail in the next two days asking about your experience.  I hope that your e-visit has been valuable and will speed your recovery. Thank you for using e-visits.   I spent 5-10 minutes on review and completion of this note- Lacy Duverney PAC .ecov

## 2020-09-24 ENCOUNTER — Ambulatory Visit: Payer: BC Managed Care – PPO | Admitting: Student

## 2020-10-01 ENCOUNTER — Other Ambulatory Visit: Payer: Self-pay | Admitting: Obstetrics and Gynecology

## 2020-10-01 DIAGNOSIS — Z1231 Encounter for screening mammogram for malignant neoplasm of breast: Secondary | ICD-10-CM

## 2020-10-03 ENCOUNTER — Ambulatory Visit
Admission: RE | Admit: 2020-10-03 | Discharge: 2020-10-03 | Disposition: A | Discharge status: Home / Self Care | Payer: BC Managed Care – PPO | Source: Ambulatory Visit

## 2020-10-03 ENCOUNTER — Other Ambulatory Visit: Payer: Self-pay

## 2020-10-03 DIAGNOSIS — Z1231 Encounter for screening mammogram for malignant neoplasm of breast: Secondary | ICD-10-CM

## 2020-10-07 NOTE — Progress Notes (Signed)
PCP:  Aretta Nip, MD Primary Cardiologist: Lauree Chandler, MD Electrophysiologist: Will Meredith Leeds, MD   Lisa Mosley is a 45 y.o. female seen today for Will Meredith Leeds, MD for acute visit due to chest pain.  Since last being seen in our clinic the patient reports doing OK overall. She has had approx 4 episodes of chest discomfort she describes as a "tightness". It comes on and off suddenly, having lasted between 5-15 minutes. Denies radiation, SOB, or diaphoresis. No relation to exertion. Has happened during standing conversation or while seated watching TV. Denies any clear relation to food, or any clear aggravating or relieving factors for that matter.  She had COVID, but this was after the first episode, so she doubts this is any residual effect of this.  She denies symptoms of palpitations, shortness of breath, orthopnea, PND, lower extremity edema, claudication, presyncope, syncope, bleeding, or neurologic sequela. The patient is tolerating medications without difficulties. She has mild lightheadedness at times with rapid standing. Felt to be due to poor iron stores.   Past Medical History:  Diagnosis Date  . HTN (hypertension)   . Migraines   . Nephrolithiasis   . PVC (premature ventricular contraction)    Past Surgical History:  Procedure Laterality Date  . ABDOMINAL HYSTERECTOMY    . APPENDECTOMY    . CHOLECYSTECTOMY    . HERNIA REPAIR    . TUBAL LIGATION     Now reversed    Current Outpatient Medications  Medication Sig Dispense Refill  . diltiazem (CARDIZEM CD) 180 MG 24 hr capsule Take 1 capsule by mouth daily.    Marland Kitchen escitalopram (LEXAPRO) 10 MG tablet Take 10 mg by mouth at bedtime.  2  . ferrous sulfate 325 (65 FE) MG tablet Take 325 mg by mouth daily with breakfast.    . flecainide (TAMBOCOR) 100 MG tablet Take 1 tablet (100 mg total) by mouth 2 (two) times daily. 180 tablet 3  . losartan (COZAAR) 100 MG tablet TAKE 1 TABLET BY MOUTH EVERY DAY 90  tablet 1  . meloxicam (MOBIC) 15 MG tablet Take 1 tablet (15 mg total) by mouth daily. 30 tablet 3   No current facility-administered medications for this visit.    Allergies  Allergen Reactions  . Nitrofurantoin Rash  . Prochlorperazine     Other reaction(s): Other Bugs crawling Other reaction(s): Other  . Hydrocodone Itching  . Latex Rash    Social History   Socioeconomic History  . Marital status: Married    Spouse name: Not on file  . Number of children: 3  . Years of education: Not on file  . Highest education level: Not on file  Occupational History  . Occupation: Student [employer]  Tobacco Use  . Smoking status: Former Smoker    Packs/day: 0.50    Years: 10.00    Pack years: 5.00    Types: Cigarettes  . Smokeless tobacco: Never Used  Vaping Use  . Vaping Use: Never used  Substance and Sexual Activity  . Alcohol use: Yes    Comment: Rare  . Drug use: No  . Sexual activity: Not on file  Other Topics Concern  . Not on file  Social History Narrative  . Not on file   Social Determinants of Health   Financial Resource Strain: Not on file  Food Insecurity: Not on file  Transportation Needs: Not on file  Physical Activity: Not on file  Stress: Not on file  Social Connections: Not on file  Intimate Partner Violence: Not on file     Review of Systems: General: No chills, fever, night sweats or weight changes  Cardiovascular:  No chest pain, dyspnea on exertion, edema, orthopnea, palpitations, paroxysmal nocturnal dyspnea Dermatological: No rash, lesions or masses Respiratory: No cough, dyspnea Urologic: No hematuria, dysuria Abdominal: No nausea, vomiting, diarrhea, bright red blood per rectum, melena, or hematemesis Neurologic: No visual changes, weakness, changes in mental status All other systems reviewed and are otherwise negative except as noted above.  Physical Exam: Vitals:   10/08/20 1048  BP: (!) 134/98  Pulse: 90  SpO2: 97%  Weight: 241 lb  (109.3 kg)  Height: 5\' 6"  (1.676 m)    GEN- The patient is well appearing, alert and oriented x 3 today.   HEENT: normocephalic, atraumatic; sclera clear, conjunctiva pink; hearing intact; oropharynx clear; neck supple, no JVP Lymph- no cervical lymphadenopathy Lungs- Clear to ausculation bilaterally, normal work of breathing.  No wheezes, rales, rhonchi Heart- Regular rate and rhythm, no murmurs, rubs or gallops, PMI not laterally displaced GI- soft, non-tender, non-distended, bowel sounds present, no hepatosplenomegaly Extremities- no clubbing, cyanosis, or edema; DP/PT/radial pulses 2+ bilaterally MS- no significant deformity or atrophy Skin- warm and dry, no rash or lesion Psych- euthymic mood, full affect Neuro- strength and sensation are intact  EKG is ordered. Personal review of EKG from today shows NSR at 90 bpm with QRS 90 ms on flecainide.   Additional studies reviewed include: Previous EP office notes ETT 06/2017 with no significant QRS widening at peak exercise and normal BP response. Negative stress.  Monitor 1/2021with 25% PVCs and rare PACs  Assessment and Plan:  1. Chest pain, atypical With atypical chest pain and on flecainide, think most reasonable step would be to pursue stress testing. With no recent imaging, will order Stress Echo which will also give [place] information on her HRs and response to exercise. Echo 06/2017 LVEF 55%, Grade 1 DD  2.PVCs Previous monitor showed burden of 25% Minimal symptoms if any.   ECG today with NSR and stable intervals LVEF 55% by TTE 06/2017 Continue flecainide 100 mg BID. Stable  3.HTN Continue losartan 100 mg daily Continue diltiazem 180 mg daily.  4. Syncope No further   5. OSA  She is scheduled for CPAP titration. National CPAP shortage has hindered this.   6. Obesity Body mass index is 38.9 kg/m.  Encouraged activity as tolerated and weight loss.  CPAP as above  Alarm symptoms of excruciating chest pain  with SOB or diaphoresis, Syncope with or without chest pain, or chest pain that does not resolve within 30 minutes of reasons to be checked out by 911 or in ER.   RTC 6 months, sooner pending stress results.  Shirley Friar, PA-C  10/08/20 10:51 AM

## 2020-10-08 ENCOUNTER — Encounter: Payer: Self-pay | Admitting: Student

## 2020-10-08 ENCOUNTER — Ambulatory Visit: Payer: BC Managed Care – PPO | Admitting: Student

## 2020-10-08 ENCOUNTER — Other Ambulatory Visit: Payer: Self-pay

## 2020-10-08 VITALS — BP 134/98 | HR 90 | Ht 66.0 in | Wt 241.0 lb

## 2020-10-08 DIAGNOSIS — R5383 Other fatigue: Secondary | ICD-10-CM | POA: Diagnosis not present

## 2020-10-08 DIAGNOSIS — I10 Essential (primary) hypertension: Secondary | ICD-10-CM

## 2020-10-08 DIAGNOSIS — R002 Palpitations: Secondary | ICD-10-CM

## 2020-10-08 DIAGNOSIS — I493 Ventricular premature depolarization: Secondary | ICD-10-CM

## 2020-10-08 DIAGNOSIS — G4730 Sleep apnea, unspecified: Secondary | ICD-10-CM

## 2020-10-08 DIAGNOSIS — R0789 Other chest pain: Secondary | ICD-10-CM

## 2020-10-08 LAB — BASIC METABOLIC PANEL
BUN/Creatinine Ratio: 14 (ref 9–23)
BUN: 12 mg/dL (ref 6–24)
CO2: 20 mmol/L (ref 20–29)
Calcium: 9.3 mg/dL (ref 8.7–10.2)
Chloride: 100 mmol/L (ref 96–106)
Creatinine, Ser: 0.84 mg/dL (ref 0.57–1.00)
Glucose: 121 mg/dL — ABNORMAL HIGH (ref 65–99)
Potassium: 3.9 mmol/L (ref 3.5–5.2)
Sodium: 137 mmol/L (ref 134–144)
eGFR: 87 mL/min/{1.73_m2} (ref >59)

## 2020-10-08 LAB — CBC
Hematocrit: 41.3 % (ref 34.0–46.6)
Hemoglobin: 13.4 g/dL (ref 11.1–15.9)
MCH: 27.9 pg (ref 26.6–33.0)
MCHC: 32.4 g/dL (ref 31.5–35.7)
MCV: 86 fL (ref 79–97)
Platelets: 287 10*3/uL (ref 150–450)
RBC: 4.8 x10E6/uL (ref 3.77–5.28)
RDW: 14.5 % (ref 11.7–15.4)
WBC: 10.1 10*3/uL (ref 3.4–10.8)

## 2020-10-08 MED ORDER — DILTIAZEM HCL ER COATED BEADS 180 MG PO CP24: 180.0000 mg | ORAL_CAPSULE | Freq: Every day | ORAL | 3 refills | Status: DC

## 2020-10-08 MED ORDER — LOSARTAN POTASSIUM 100 MG PO TABS: 1.0000 | ORAL_TABLET | Freq: Every day | ORAL | 3 refills | Status: DC

## 2020-10-08 NOTE — Patient Instructions (Signed)
Medication Instructions:  Your physician recommends that you continue on your current medications as directed. Please refer to the Current Medication list given to you today.  *If you need a refill on your cardiac medications before your next appointment, please call your pharmacy*   Lab Work: TODAY: BMET, CBC  If you have labs (blood work) drawn today and your tests are completely normal, you will receive your results only by: Marland Kitchen MyChart Message (if you have MyChart) OR . A paper copy in the mail If you have any lab test that is abnormal or we need to change your treatment, we will call you to review the results.   Testing/Procedures: Your physician has requested that you have a stress echocardiogram. For further information please visit [email or web address]. Please follow instruction sheet as given.    Follow-Up: At [facility], you and your health needs are our priority.  As part of our continuing mission to provide you with exceptional heart care, we have created designated Provider Care Teams.  These Care Teams include your primary Cardiologist (physician) and Advanced Practice Providers (APPs -  Physician Assistants and Nurse Practitioners) who all work together to provide you with the care you need, when you need it.   Your next appointment:   6 month(s)  The format for your next appointment:   In Person  Provider:   You may see Will Meredith Leeds, MD or one of the following Advanced Practice Providers on your designated Care Team:     Tommye Standard, [place]  Legrand Como "[facility]" [place], [place]

## 2020-10-08 NOTE — Addendum Note (Signed)
Addended by: Carylon Perches on: 10/08/2020 01:47 PM   Modules accepted: Orders

## 2020-10-09 ENCOUNTER — Ambulatory Visit: Payer: BC Managed Care – PPO | Admitting: Podiatry

## 2020-10-19 NOTE — Addendum Note (Signed)
Addended by: Barrington Ellison A on: 10/19/2020 11:22 AM   Modules accepted: Orders

## 2020-11-18 ENCOUNTER — Telehealth (HOSPITAL_COMMUNITY): Payer: Self-pay | Admitting: *Deleted

## 2020-11-18 NOTE — Telephone Encounter (Signed)
Patient given detailed instructions per Stress Test Requisition Sheet for test on 11/24/20 at 1400.Patient Notified to arrive 30 minutes early, and that it is imperative to arrive on time for appointment to keep from having the test rescheduled.  Patient verbalized understanding. Earlee Herald, Ranae [place]

## 2020-11-24 ENCOUNTER — Ambulatory Visit (HOSPITAL_COMMUNITY): Payer: BC Managed Care – PPO

## 2020-11-24 ENCOUNTER — Ambulatory Visit (HOSPITAL_COMMUNITY): Payer: BC Managed Care – PPO | Attending: Student

## 2020-11-24 ENCOUNTER — Other Ambulatory Visit: Payer: Self-pay

## 2020-11-24 DIAGNOSIS — R0789 Other chest pain: Secondary | ICD-10-CM | POA: Insufficient documentation

## 2020-11-24 LAB — ECHOCARDIOGRAM STRESS TEST: Area-P 1/2: 3.53 cm2

## 2020-11-24 MED ORDER — PERFLUTREN LIPID MICROSPHERE
1.0000 mL | INTRAVENOUS | Status: AC | PRN
  Administered 2020-11-24: 5 mL via INTRAVENOUS

## 2021-01-16 ENCOUNTER — Other Ambulatory Visit: Payer: Self-pay

## 2021-01-16 ENCOUNTER — Ambulatory Visit: Payer: BC Managed Care – PPO | Admitting: Allergy

## 2021-01-16 ENCOUNTER — Telehealth: Payer: Self-pay | Admitting: Allergy

## 2021-01-16 ENCOUNTER — Encounter: Payer: Self-pay | Admitting: Allergy

## 2021-01-16 VITALS — BP 128/82 | HR 90 | Temp 98.4°F | Resp 16 | Ht 66.0 in | Wt 237.2 lb

## 2021-01-16 DIAGNOSIS — J3089 Other allergic rhinitis: Secondary | ICD-10-CM | POA: Diagnosis not present

## 2021-01-16 DIAGNOSIS — L2089 Other atopic dermatitis: Secondary | ICD-10-CM | POA: Diagnosis not present

## 2021-01-16 DIAGNOSIS — K9049 Malabsorption due to intolerance, not elsewhere classified: Secondary | ICD-10-CM

## 2021-01-16 DIAGNOSIS — H1013 Acute atopic conjunctivitis, bilateral: Secondary | ICD-10-CM | POA: Diagnosis not present

## 2021-01-16 DIAGNOSIS — T7800XD Anaphylactic reaction due to unspecified food, subsequent encounter: Secondary | ICD-10-CM

## 2021-01-16 MED ORDER — AZELASTINE HCL 0.1 % NA SOLN: 2.0000 | Freq: Two times a day (BID) | NASAL | 5 refills | Status: DC

## 2021-01-16 MED ORDER — EPINEPHRINE 0.3 MG/0.3ML IJ SOAJ: 0.3000 mg | Freq: Once | INTRAMUSCULAR | 1 refills | Status: AC

## 2021-01-16 NOTE — Telephone Encounter (Signed)
Patient states she was told at her NP appointment today (9/16) that she must always have access to an EpiPen. She wanted to know if the EpiPen was going to be sent in to her pharmacy.   Best pharmacy- [facility] on [address]

## 2021-01-16 NOTE — Patient Instructions (Addendum)
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  These are over-the-counter options -For nasal congestion can use over-the-counter Nasacort, Flonase or Rhinocort 2 sprays each nostril daily for 1-2 weeks at a time before stopping once nasal congestion improves for maximum benefit -For nasal drainage can use over-the-counter Astepro 2 sprays each nostril twice a day as needed -For itchy or watery eyes can use over-the-counter Pataday or Pataday extra strength 1 drop each eye daily as needed  -Moisturize with a thick lotion after bathing -Can use hydrocortisone ointment on eczema flared areas (itchy, dry, red/irritated, rough, sting daily or flaky) twice a day as needed.  This is over-the-counter.  If you are needing something more than hydrocortisone let us know we can provide recommendations  Follow-up in 6-12 months or sooner if needed

## 2021-01-16 NOTE — Telephone Encounter (Signed)
Left a message for patient, letting her know Auvi-Q will be sent into the pharmacy and she should receive a phone call to verify her address because it will be shipped to her via mail.

## 2021-01-16 NOTE — Progress Notes (Signed)
New Patient Note  RE: Lisa Mosley MRN: [id number] DOB: 10/13/1975 Date of Office Visit: 01/16/2021  Referring provider: Aretta Nip, MD Primary care provider: Aurea Graff.Marlou Sa, MD  Chief Complaint: ?food allergy  History of present illness: Lisa Mosley is a 45 y.o. female presenting today for consultation for possible food allergy.    She was eating pistachio this weekend and her throat started hurting and felt scratchy, her chest felt tight and she became itchy.  She states around her neck turned red.  She states the symptoms started about 20 minutes after eating.   She had braces for 3 years and when she got them removed she wanted to start back eating nuts.  She states over the summer she was eating mixed nuts and states she was feeling sick as she was noted sore throat and itching.  She states she does get itchy often and thought she had a URI with the sore throat.  But now she is wondering if she was reacting to the nuts.   She reports dairy causes GI issue with cramping and bloating thus she tries to avoid.   No previous history of food allergy or asthma. She does report seasonal allergies with sneezing, itchy/watery eyes, runny/stuffy nose.  Typically worse in the summer.  She has been prescribed flonase in the past and was helpful when use in the past.  She has not used any antihistamines in a long time.  She states she has been prescribe topical creams for eczema in the past.  She reports mostly would flare on her arms.      Review of systems: Review of Systems  Constitutional: Negative.   HENT:         See HPI  Eyes: Negative.   Respiratory:         See HPI  Cardiovascular: Negative.   Gastrointestinal: Negative.   Musculoskeletal: Negative.   Skin:  Positive for itching and rash.  Neurological: Negative.    All other systems negative unless noted above in HPI  Past medical history: Past Medical History:  Diagnosis Date   HTN (hypertension)     Migraines    Nephrolithiasis    PVC (premature ventricular contraction)     Past surgical history: Past Surgical History:  Procedure Laterality Date   ABDOMINAL HYSTERECTOMY     APPENDECTOMY     CHOLECYSTECTOMY     HERNIA REPAIR     TUBAL LIGATION     Now reversed    Family history:  Family History  Problem Relation Age of Onset   Heart attack Father        54s    Social history: Lives in a home without carpeting with gas heating and central cooling.  Cats in the home.  There is no concern for water damage, mildew or roaches in the home.  She is a Pharmacist, hospital.  She does not report a smoking history.   Medication List: Current Outpatient Medications  Medication Sig Dispense Refill   diltiazem (CARDIZEM CD) 180 MG 24 hr capsule Take 1 capsule (180 mg total) by mouth daily. 90 capsule 3   escitalopram (LEXAPRO) 10 MG tablet Take 10 mg by mouth at bedtime.  2   ferrous sulfate 325 (65 FE) MG tablet Take 325 mg by mouth daily with breakfast.     flecainide (TAMBOCOR) 100 MG tablet Take 1 tablet (100 mg total) by mouth 2 (two) times daily. 180 tablet 3   losartan (COZAAR) 100 MG tablet  Take 1 tablet (100 mg total) by mouth daily. 90 tablet 3   meloxicam (MOBIC) 15 MG tablet Take 1 tablet (15 mg total) by mouth daily. 30 tablet 3   EPINEPHrine (AUVI-Q) 0.3 mg/0.3 mL IJ SOAJ injection Inject 0.3 mg into the muscle once for 1 dose. As directed for life-threatening allergic reactions 2 each 1   No current facility-administered medications for this visit.    Known medication allergies: Allergies  Allergen Reactions   Nitrofurantoin Rash   Prochlorperazine     Other reaction(s): Other Bugs crawling Other reaction(s): Other   Hydrocodone Itching   Latex Rash     Physical examination: Blood pressure 128/82, pulse 90, temperature 98.4 F (36.9 C), temperature source Temporal, resp. rate 16, height '5\' 6"'$  (1.676 m), weight 237 lb 4 oz (107.6 kg), SpO2 97 %.  General: Alert,  interactive, in no acute distress. HEENT: PERRLA, TMs pearly gray, turbinates non-edematous without discharge, post-pharynx non erythematous. Neck: Supple without lymphadenopathy. Lungs: Clear to auscultation without wheezing, rhonchi or rales. {no increased work of breathing. CV: Normal S1, S2 without murmurs. Abdomen: Nondistended, nontender. Skin: Warm and dry, without lesions or rashes. Extremities:  No clubbing, cyanosis or edema. Neuro:   Grossly intact.  Diagnositics/Labs:  Allergy testing: Select food allergy skin prick testing is is positive to hazelnut and pistachio.  Negative to peanut, milk, cashew, pecan, walnut, almond, [place] nut and coconut Environmental allergy skin prick testing is grass pollens, weed pollens, to tree pollens ash to pecan, molds, both dust mites, cat and horse.  Allergy testing results were read and interpreted by provider, documented by clinical staff.   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 -Will obtain serum IgE levels to nuts to determine if you can eat other nuts (besides pistachio and hazelnut at this time) -Continue avoidance of nuts until labs return -Have access to self-injectable epinephrine (Epipen or AuviQ) 0.'3mg'$  at all times -Follow emergency action plan in case of allergic reaction -Milk represents a food intolerance with negative testing and GI symptoms with ingestion  Allergic rhinitis with conjunctivitis -Environmental allergy testing is positive to grasses, weeds, trees, molds, dust mite, cat and horse -Allergen avoidance measures discussed/handouts provided -For general allergy symptom relief can use long-acting antihistamines like Zyrtec, Allegra or Xyzal daily as needed.  These are over-the-counter options -For nasal congestion can use over-the-counter Nasacort, Flonase or Rhinocort 2 sprays each nostril daily for 1-2 weeks at a time before  stopping once nasal congestion improves for maximum benefit -For nasal drainage can use over-the-counter Astepro 2 sprays each nostril twice a day as needed -For itchy or watery eyes can use over-the-counter Pataday or Pataday extra strength 1 drop each eye daily as needed  Eczema -Moisturize with a thick lotion after bathing -Can use hydrocortisone ointment on eczema flared areas (itchy, dry, red/irritated, rough, sting daily or flaky) twice a day as needed.  This is over-the-counter.  If you are needing something more than hydrocortisone let us know we can provide recommendations  Follow-up in 6-12 months or sooner if needed  I appreciate the opportunity to take part in [place] care. Please do not hesitate to contact me with questions.  Sincerely,   Prudy Feeler, MD Allergy/Immunology [facility]

## 2021-01-23 LAB — IGE NUT PROF. W/COMPONENT RFLX
F017-IgE Hazelnut (Filbert): <0.1 kU/L
F018-IgE Brazil Nut: <0.1 kU/L
F020-IgE Almond: <0.1 kU/L
F202-IgE Cashew Nut: 0.1 kU/L — AB
F203-IgE Pistachio Nut: 0.11 kU/L — AB
F256-IgE Walnut: <0.1 kU/L
Macadamia Nut, IgE: <0.1 kU/L
Peanut, IgE: <0.1 kU/L
Pecan Nut IgE: <0.1 kU/L

## 2021-01-23 LAB — ALLERGEN COMPONENT COMMENTS

## 2021-01-23 LAB — PANEL 604239: ANA O 3 IgE: <0.1 kU/L

## 2021-02-08 ENCOUNTER — Other Ambulatory Visit: Payer: Self-pay | Admitting: Cardiology

## 2021-03-13 ENCOUNTER — Other Ambulatory Visit: Payer: Self-pay

## 2021-03-13 ENCOUNTER — Ambulatory Visit: Payer: BC Managed Care – PPO | Admitting: Podiatry

## 2021-03-13 ENCOUNTER — Ambulatory Visit (INDEPENDENT_AMBULATORY_CARE_PROVIDER_SITE_OTHER): Payer: BC Managed Care – PPO

## 2021-03-13 DIAGNOSIS — T148XXA Other injury of unspecified body region, initial encounter: Secondary | ICD-10-CM

## 2021-03-13 NOTE — Progress Notes (Signed)
Subjective:  Patient ID: Lisa Mosley, female    DOB: 1976/04/12,  MRN: [id number]  No chief complaint on file.   45 y.o. female presents with the above complaint.  Patient presents with new complaint of left lateral foot pain.  Patient states been going for quite some time there are some numbness and swelling and pain with ambulation.  She wanted to get it evaluated.  She has not seen MRIs prior to seeing me.  She was treated for Planter fasciitis on the other side before.  She denies any other acute complaints.  She is worried they could be fractured.  She has not had any acute injury to the area.  She does not recall any other aggravating factors.   Review of Systems: Negative except as noted in the HPI. Denies N/V/F/Ch.  Past Medical History:  Diagnosis Date   HTN (hypertension)    Migraines    Nephrolithiasis    PVC (premature ventricular contraction)     Current Outpatient Medications:    azelastine (ASTELIN) 0.1 % nasal spray, Place 2 sprays into both nostrils 2 (two) times daily. Use in each nostril as directed, Disp: 30 mL, Rfl: 5   diltiazem (CARDIZEM CD) 180 MG 24 hr capsule, Take 1 capsule (180 mg total) by mouth daily., Disp: 90 capsule, Rfl: 3   escitalopram (LEXAPRO) 10 MG tablet, Take 10 mg by mouth at bedtime., Disp: , Rfl: 2   ferrous sulfate 325 (65 FE) MG tablet, Take 325 mg by mouth daily with breakfast., Disp: , Rfl:    flecainide (TAMBOCOR) 100 MG tablet, TAKE 1 TABLET BY MOUTH TWICE A DAY, Disp: 180 tablet, Rfl: 2   losartan (COZAAR) 100 MG tablet, Take 1 tablet (100 mg total) by mouth daily., Disp: 90 tablet, Rfl: 3   meloxicam (MOBIC) 15 MG tablet, Take 1 tablet (15 mg total) by mouth daily., Disp: 30 tablet, Rfl: 3  Social History   Tobacco Use  Smoking Status Former   Packs/day: 0.50   Years: 10.00   Pack years: 5.00   Types: Cigarettes  Smokeless Tobacco Never    Allergies  Allergen Reactions   Nitrofurantoin Rash   Prochlorperazine     Other  reaction(s): Other Bugs crawling Other reaction(s): Other   Hydrocodone Itching   Latex Rash   Objective:  There were no vitals filed for this visit. There is no height or weight on file to calculate BMI. Constitutional Well developed. Well nourished.  Vascular Dorsalis pedis pulses palpable bilaterally. Posterior tibial pulses palpable bilaterally. Capillary refill normal to all digits.  No cyanosis or clubbing noted. Pedal hair growth normal.  Neurologic Normal speech. Oriented to person, place, and time. Epicritic sensation to light touch grossly present bilaterally.  Dermatologic Nails well groomed and normal in appearance. No open wounds. No skin lesions.  Orthopedic: Pain on palpation along the course of the fifth metatarsal.  No pain at the peroneal tendon insertion.  No pain with dorsiflexion eversion of the foot resisted.  No pain with plantarflexion inversion of the foot.   Radiographs: 3 views of skeletally mature adult left foot: No stress fracture noted no actual fracture noted.  No bony abnormalities identified Assessment:   1. Bone bruise    Plan:  Patient was evaluated and treated and all questions answered.  Left fifth metatarsal bone bruise versus nonradiographic stress fracture -I explained to the patient the various treatment options were discussed about bone bruising. -Given the amount of pain that she is having in  the setting of ambulation I believe patient will benefit from cam boot immobilization to allow the soft tissue structures to heal appropriately.  She states understanding. -New cam boot was dispensed that she has lost the previous one.  No follow-ups on file.

## 2021-04-08 ENCOUNTER — Other Ambulatory Visit: Payer: Self-pay

## 2021-04-08 ENCOUNTER — Ambulatory Visit: Payer: BC Managed Care – PPO | Admitting: Podiatry

## 2021-04-08 ENCOUNTER — Ambulatory Visit: Payer: BC Managed Care – PPO

## 2021-04-08 ENCOUNTER — Encounter: Payer: Self-pay | Admitting: Podiatry

## 2021-04-08 DIAGNOSIS — T148XXA Other injury of unspecified body region, initial encounter: Secondary | ICD-10-CM

## 2021-04-08 DIAGNOSIS — Q666 Other congenital valgus deformities of feet: Secondary | ICD-10-CM

## 2021-04-08 DIAGNOSIS — M722 Plantar fascial fibromatosis: Secondary | ICD-10-CM

## 2021-04-08 NOTE — Progress Notes (Signed)
Subjective:  Patient ID: Lisa Mosley, female    DOB: 1975/08/19,  MRN: [id number]  Chief Complaint  Patient presents with   Foot Pain    Pt stated that her foot is doing better      45 y.o. female presents with the above complaint.  Patient presents with follow-up of left lateral foot pain.  She states she is doing a little bit better.  She has had improvement about 80% with a cam boot.  At this time I discussed with the patient that given that there is some improvement we will discuss to continue doing [place] cam boot.  She denies any other acute complaints.   Review of Systems: Negative except as noted in the HPI. Denies N/V/F/Ch.  Past Medical History:  Diagnosis Date   HTN (hypertension)    Migraines    Nephrolithiasis    PVC (premature ventricular contraction)     Current Outpatient Medications:    azelastine (ASTELIN) 0.1 % nasal spray, Place 2 sprays into both nostrils 2 (two) times daily. Use in each nostril as directed, Disp: 30 mL, Rfl: 5   diltiazem (CARDIZEM CD) 180 MG 24 hr capsule, Take 1 capsule (180 mg total) by mouth daily., Disp: 90 capsule, Rfl: 3   escitalopram (LEXAPRO) 10 MG tablet, Take 10 mg by mouth at bedtime., Disp: , Rfl: 2   ferrous sulfate 325 (65 FE) MG tablet, Take 325 mg by mouth daily with breakfast., Disp: , Rfl:    flecainide (TAMBOCOR) 100 MG tablet, TAKE 1 TABLET BY MOUTH TWICE A DAY, Disp: 180 tablet, Rfl: 2   losartan (COZAAR) 100 MG tablet, Take 1 tablet (100 mg total) by mouth daily., Disp: 90 tablet, Rfl: 3   meloxicam (MOBIC) 15 MG tablet, Take 1 tablet (15 mg total) by mouth daily., Disp: 30 tablet, Rfl: 3  Social History   Tobacco Use  Smoking Status Former   Packs/day: 0.50   Years: 10.00   Pack years: 5.00   Types: Cigarettes  Smokeless Tobacco Never    Allergies  Allergen Reactions   Nitrofurantoin Rash   Prochlorperazine     Other reaction(s): Other Bugs crawling Other reaction(s): Other   Hydrocodone Itching    Latex Rash   Objective:  There were no vitals filed for this visit. There is no height or weight on file to calculate BMI. Constitutional Well developed. Well nourished.  Vascular Dorsalis pedis pulses palpable bilaterally. Posterior tibial pulses palpable bilaterally. Capillary refill normal to all digits.  No cyanosis or clubbing noted. Pedal hair growth normal.  Neurologic Normal speech. Oriented to person, place, and time. Epicritic sensation to light touch grossly present bilaterally.  Dermatologic Nails well groomed and normal in appearance. No open wounds. No skin lesions.  Orthopedic: Decreased pain on palpation along the course of the fifth metatarsal.  No pain at the peroneal tendon insertion.  No pain with dorsiflexion eversion of the foot resisted.  No pain with plantarflexion inversion of the foot.   Radiographs: 3 views of skeletally mature adult left foot: No stress fracture noted no actual fracture noted.  No bony abnormalities identified Assessment:   1. Bone bruise   2. Pes planovalgus     Plan:  Patient was evaluated and treated and all questions answered.  Left fifth metatarsal bone bruise versus nonradiographic stress fracture -I explained to the patient the various treatment options were discussed about bone bruising. -Clinically her pain is improving therefore I will hold off on steroid injection or MRI  for now.  She would benefit from orthotics as described below. -Continue wearing the cam boot.  -Pes planovalgus -I explained to patient the etiology of pes planovalgus and relationship with fifth metatarsal bone bruise and various treatment options were discussed.  Given patient foot structure in the setting of Planter fasciitis I believe patient will benefit from custom-made orthotics to help control the hindfoot motion support the arch of the foot and take the stress away from plantar fascial.  Patient agrees with the plan like to proceed with  orthotics -Patient was casted for orthotics   No follow-ups on file.

## 2021-04-08 NOTE — Progress Notes (Signed)
SITUATION Reason for Consult: Evaluation for Bilateral Custom Foot Orthoses Patient / Caregiver Report: Patient is ready to get out of her cam boot  OBJECTIVE DATA: Patient History / Diagnosis: Bone bruise  Plantar fasciitis of right foot  Current or Previous Devices: CAM boot  Foot Examination: Skin presentation:   Intact Ulcers & [place]:   None and no history Toe / Foot Deformities:  Pes Planus Weight Bearing Presentation:  Planus Sensation:    Intact  ORTHOTIC RECOMMENDATION Recommended Device: 1x pair of custom functional foot orthoses  GOALS OF ORTHOSES - Reduce Pain - Prevent Foot Deformity - Prevent Progression of Further Foot Deformity - Relieve Pressure - Improve the Overall Biomechanical Function of the Foot and Lower Extremity.  ACTIONS PERFORMED Patient was casted for Foot Orthoses via crush box. Procedure was explained and patient tolerated procedure well. All questions were answered and concerns addressed.  PLAN Potential out of pocket cost was communicated to patient. Casts are to be sent to [facility] for fabrication. Patient is to be called for fitting when devices are ready.

## 2021-04-10 ENCOUNTER — Ambulatory Visit: Payer: BC Managed Care – PPO | Admitting: Podiatry

## 2021-05-06 ENCOUNTER — Other Ambulatory Visit: Payer: Self-pay

## 2021-07-07 NOTE — Progress Notes (Unsigned)
PCP:  Alroy Dust, L.Marlou Sa, MD Primary Cardiologist: Lauree Chandler, MD Electrophysiologist: Will Meredith Leeds, MD   Lisa Mosley is a 46 y.o. female seen today for Will Meredith Leeds, MD for {Blank single:19197::"cardiac clearance","post hospital follow up","acute visit due to ***","routine electrophysiology followup"}.  Since {Blank single:19197::"last being seen in our clinic","discharge from hospital"} the patient reports doing ***.  she denies chest pain, palpitations, dyspnea, PND, orthopnea, nausea, vomiting, dizziness, syncope, edema, weight gain, or early satiety.  Past Medical History:  Diagnosis Date   HTN (hypertension)    Migraines    Nephrolithiasis    PVC (premature ventricular contraction)    Past Surgical History:  Procedure Laterality Date   ABDOMINAL HYSTERECTOMY     APPENDECTOMY     CHOLECYSTECTOMY     HERNIA REPAIR     TUBAL LIGATION     Now reversed    Current Outpatient Medications  Medication Sig Dispense Refill   azelastine (ASTELIN) 0.1 % nasal spray Place 2 sprays into both nostrils 2 (two) times daily. Use in each nostril as directed 30 mL 5   diltiazem (CARDIZEM CD) 180 MG 24 hr capsule Take 1 capsule (180 mg total) by mouth daily. 90 capsule 3   escitalopram (LEXAPRO) 10 MG tablet Take 10 mg by mouth at bedtime.  2   ferrous sulfate 325 (65 FE) MG tablet Take 325 mg by mouth daily with breakfast.     flecainide (TAMBOCOR) 100 MG tablet TAKE 1 TABLET BY MOUTH TWICE A DAY 180 tablet 2   losartan (COZAAR) 100 MG tablet Take 1 tablet (100 mg total) by mouth daily. 90 tablet 3   meloxicam (MOBIC) 15 MG tablet Take 1 tablet (15 mg total) by mouth daily. 30 tablet 3   No current facility-administered medications for this visit.    Allergies  Allergen Reactions   Nitrofurantoin Rash   Prochlorperazine     Other reaction(s): Other Bugs crawling Other reaction(s): Other   Hydrocodone Itching   Latex Rash    Social History   Socioeconomic  History   Marital status: Married    Spouse name: Not on file   Number of children: 3   Years of education: Not on file   Highest education level: Not on file  Occupational History   Occupation: Student [employer]  Tobacco Use   Smoking status: Former    Packs/day: 0.50    Years: 10.00    Pack years: 5.00    Types: Cigarettes   Smokeless tobacco: Never  Vaping Use   Vaping Use: Never used  Substance and Sexual Activity   Alcohol use: Yes    Comment: Rare   Drug use: No   Sexual activity: Not on file  Other Topics Concern   Not on file  Social History Narrative   Not on file   Social Determinants of Health   Financial Resource Strain: Not on file  Food Insecurity: Not on file  Transportation Needs: Not on file  Physical Activity: Not on file  Stress: Not on file  Social Connections: Not on file  Intimate Partner Violence: Not on file     Review of Systems: All other systems reviewed and are otherwise negative except as noted above.  Physical Exam: There were no vitals filed for this visit.  GEN- The patient is well appearing, alert and oriented x 3 today.   HEENT: normocephalic, atraumatic; sclera clear, conjunctiva pink; hearing intact; oropharynx clear; neck supple, no JVP Lymph- no cervical lymphadenopathy Lungs- Clear to  ausculation bilaterally, normal work of breathing.  No wheezes, rales, rhonchi Heart- Regular rate and rhythm, no murmurs, rubs or gallops, PMI not laterally displaced GI- soft, non-tender, non-distended, bowel sounds present, no hepatosplenomegaly Extremities- no clubbing, cyanosis, or edema; DP/PT/radial pulses 2+ bilaterally MS- no significant deformity or atrophy Skin- warm and dry, no rash or lesion Psych- euthymic mood, full affect Neuro- strength and sensation are intact  EKG {ACTION; IS/IS [id number]} ordered. Personal review of EKG from {Blank single:19197::"today","***"} shows ***  Additional studies reviewed include: Previous EP  office notes. ***  Assessment and Plan:  1. PVCs Previous monitor showed burden of 25% Minimal symptoms if any.   ECG today with *** and stable interval son Flecainide 100 mg BID  LVEF 55% by TTE 06/2017   2. HTN Continue losartan 100 mg daily Continue diltiazem 180 mg daily    3. Syncope No further   4. OSA  She is scheduled for CPAP titration. National CPAP shortage has hindered this.    5. Obesity There is no height or weight on file to calculate BMI.  Encouraged activity as tolerated and weight loss.  CPAP as above  6. Chest pain, atypical Stress test 10/2020 low risk.  Echo 06/2017 LVEF 55%, Grade 1 DD  Follow up with {Blank single:19197::"Dr. Allred","Dr. Arlan Organ. Klein","Dr. Camnitz","Dr. Lambert","EP APP"} in {Blank single:19197::"2 weeks","4 weeks","3 months","6 months","12 months","as usual post gen change"}   Shirley Friar, PA-C  07/07/21 9:04 AM

## 2021-07-14 ENCOUNTER — Other Ambulatory Visit: Payer: Self-pay

## 2021-07-14 ENCOUNTER — Encounter: Payer: Self-pay | Admitting: Student

## 2021-07-14 ENCOUNTER — Ambulatory Visit: Payer: BC Managed Care – PPO | Admitting: Student

## 2021-07-14 VITALS — BP 126/82 | HR 75 | Ht 66.0 in | Wt 253.0 lb

## 2021-07-14 DIAGNOSIS — I493 Ventricular premature depolarization: Secondary | ICD-10-CM

## 2021-07-14 DIAGNOSIS — I10 Essential (primary) hypertension: Secondary | ICD-10-CM

## 2021-07-14 DIAGNOSIS — R5383 Other fatigue: Secondary | ICD-10-CM | POA: Diagnosis not present

## 2021-07-14 DIAGNOSIS — G4730 Sleep apnea, unspecified: Secondary | ICD-10-CM | POA: Diagnosis not present

## 2021-07-14 DIAGNOSIS — R0789 Other chest pain: Secondary | ICD-10-CM | POA: Diagnosis not present

## 2021-07-14 LAB — BASIC METABOLIC PANEL
BUN/Creatinine Ratio: 10 (ref 9–23)
BUN: 8 mg/dL (ref 6–24)
CO2: 24 mmol/L (ref 20–29)
Calcium: 8.9 mg/dL (ref 8.7–10.2)
Chloride: 102 mmol/L (ref 96–106)
Creatinine, Ser: 0.78 mg/dL (ref 0.57–1.00)
Glucose: 94 mg/dL (ref 70–99)
Potassium: 4.2 mmol/L (ref 3.5–5.2)
Sodium: 138 mmol/L (ref 134–144)
eGFR: 94 mL/min/{1.73_m2} (ref >59)

## 2021-07-14 NOTE — Patient Instructions (Signed)
Medication Instructions:  Your physician recommends that you continue on your current medications as directed. Please refer to the Current Medication list given to you today.  *If you need a refill on your cardiac medications before your next appointment, please call your pharmacy*   Lab Work: TODAY: BMET  If you have labs (blood work) drawn today and your tests are completely normal, you will receive your results only by: MyChart Message (if you have MyChart) OR A paper copy in the mail If you have any lab test that is abnormal or we need to change your treatment, we will call you to review the results.   Follow-Up: At [facility], you and your health needs are our priority.  As part of our continuing mission to provide you with exceptional heart care, we have created designated Provider Care Teams.  These Care Teams include your primary Cardiologist (physician) and Advanced Practice Providers (APPs -  Physician Assistants and Nurse Practitioners) who all work together to provide you with the care you need, when you need it.   Your next appointment:   6 month(s)  The format for your next appointment:   In Person  Provider:   Will Camnitz, MD{   

## 2021-07-16 ENCOUNTER — Other Ambulatory Visit: Payer: Self-pay

## 2021-07-16 ENCOUNTER — Encounter: Payer: Self-pay | Admitting: Allergy

## 2021-07-16 ENCOUNTER — Ambulatory Visit: Payer: BC Managed Care – PPO | Admitting: Allergy

## 2021-07-16 VITALS — BP 112/76 | HR 85 | Temp 98.1°F | Resp 16 | Ht 66.0 in | Wt 252.6 lb

## 2021-07-16 DIAGNOSIS — H1013 Acute atopic conjunctivitis, bilateral: Secondary | ICD-10-CM | POA: Diagnosis not present

## 2021-07-16 DIAGNOSIS — J3089 Other allergic rhinitis: Secondary | ICD-10-CM | POA: Diagnosis not present

## 2021-07-16 DIAGNOSIS — L2089 Other atopic dermatitis: Secondary | ICD-10-CM

## 2021-07-16 DIAGNOSIS — K9049 Malabsorption due to intolerance, not elsewhere classified: Secondary | ICD-10-CM | POA: Diagnosis not present

## 2021-07-16 NOTE — Progress Notes (Signed)
? ? ?Follow-up Note ? ?RE: Lisa Mosley MRN: [id number] DOB: 1975/05/30 ?Date of Office Visit: 07/16/2021 ? ? ?History of present illness: ?Lisa Mosley is a 46 y.o. female presenting today for follow-up of food allergy, food intolerance, allergic rhinitis with conjunctivitis and eczema.  She was last seen in the office on 01/16/2021 by myself.  She has done well since the last visit without any major health changes, surgeries or hospitalizations. ?She has been avoiding all nuts essentially since the last visit.  Her testing both skin and serum IgE testing was positive to pistachio and cashew as well as hazelnut.  Pistachio and cashew are her favorites of course.  She has not had any reactions.  She has not needed to use her epinephrine device.  She does limit/avoid dairy due to intolerance. ?With her allergies she states she does sneeze all the time however it does not bother her.  But she is not taking any allergy medications consistently at this time.  I have recommended Xyzal, Allegra or Zyrtec for her antihistamine as well as Nasacort, Flonase or Rhinocort for nasal congestion control and Astepro over-the-counter for nasal drainage control. ?She states she only has had mild flares with her eczema and has only required hydrocortisone use once in the past 6 months.  She does moisturize on a daily basis. ? ? ?Review of systems: ?Review of Systems  ?Constitutional: Negative.   ?HENT:  Positive for sneezing.   ?Eyes: Negative.   ?Respiratory: Negative.    ?Cardiovascular: Negative.   ?Gastrointestinal: Negative.   ?Musculoskeletal: Negative.   ?Skin: Negative.   ?Allergic/Immunologic: Negative.   ?Neurological: Negative.    ? ?All other systems negative unless noted above in HPI ? ?Past medical/social/surgical/family history have been reviewed and are unchanged unless specifically indicated below. ? ?No changes ? ?Medication List: ?Current Outpatient Medications  ?Medication Sig Dispense Refill  ? busPIRone (BUSPAR)  10 MG tablet Take 1 tablet by mouth in the morning and at bedtime.    ? diltiazem (CARDIZEM CD) 180 MG 24 hr capsule Take 1 capsule (180 mg total) by mouth daily. 90 capsule 3  ? escitalopram (LEXAPRO) 20 MG tablet Take 20 mg by mouth daily.    ? ferrous sulfate 325 (65 FE) MG tablet Take 325 mg by mouth daily with breakfast.    ? flecainide (TAMBOCOR) 100 MG tablet TAKE 1 TABLET BY MOUTH TWICE A DAY 180 tablet 2  ? guanFACINE (TENEX) 1 MG tablet Take 0.5 mg by mouth 2 (two) times daily.    ? losartan (COZAAR) 100 MG tablet Take 1 tablet (100 mg total) by mouth daily. 90 tablet 3  ? ?No current facility-administered medications for this visit.  ?  ? ?Known medication allergies: ?Allergies  ?Allergen Reactions  ? Nitrofurantoin Rash  ? Prochlorperazine   ?  Other reaction(s): Other ?Bugs crawling ?Other reaction(s): Other  ? Hydrocodone Itching  ? Latex Rash  ? ? ? ?Physical examination: ?Blood pressure 112/76, pulse 85, temperature 98.1 ?F (36.7 ?C), temperature source Temporal, resp. rate 16, height '5\' 6"'$  (1.676 m), weight 252 lb 9.6 oz (114.6 kg), SpO2 96 %. ? ?General: Alert, interactive, in no acute distress. ?HEENT: PERRLA, TMs pearly gray, turbinates non-edematous without discharge, post-pharynx non erythematous. ?Neck: Supple without lymphadenopathy. ?Lungs: Clear to auscultation without wheezing, rhonchi or rales. {no increased work of breathing. ?CV: Normal S1, S2 without murmurs. ?Abdomen: Nondistended, nontender. ?Skin: Warm and dry, without lesions or rashes. ?Extremities:  No clubbing, cyanosis or edema. ?Neuro:  Grossly intact. ? ?Diagnositics/Labs: ?Labs:  ?Component ?    Latest Ref Rng & Units 01/16/2021  ?F017-IgE Hazelnut ([place]) ?    Class 0 kU/L <0.10  ?F256-IgE [place] ?    Class 0 kU/L <0.10  ?F202-IgE Cashew Nut ?    Class 0/I kU/L 0.10 (A)  ?F018-IgE [place] Nut ?    Class 0 kU/L <0.10  ?Peanut, IgE ?    Class 0 kU/L <0.10  ?Macadamia Nut, IgE ?    Class 0 kU/L <0.10  ?Pecan Nut IgE ?     Class 0 kU/L <0.10  ?F203-IgE Pistachio Nut ?    Class 0/I kU/L 0.11 (A)  ?F020-IgE Almond ?    Class 0 kU/L <0.10  ?Comment ?     Note  ?ANA O 3 IgE ?    Class 0 kU/L <0.10  ? ? ?Assessment and plan: ?Anaphylaxis due to food ?Food Intolerance ?-Continue avoidance of pistachio/cashew and hazelnut  ?-Have access to self-injectable epinephrine (Epipen or AuviQ) 0.'3mg'$  at all times ?-Follow emergency action plan in case of allergic reaction ? ?Allergic rhinitis with conjunctivitis ?-Continue avoidance measures for grasses, weeds, trees, molds, dust mite, cat and horse ?-For general allergy symptom relief can use long-acting antihistamines like Zyrtec, Allegra or Xyzal daily as needed.  These are over-the-counter options ?-For nasal congestion can use over-the-counter Nasacort, Flonase or Rhinocort 2 sprays each nostril daily for 1-2 weeks at a time before stopping once nasal congestion improves for maximum benefit ?-For nasal drainage can use over-the-counter Astepro 2 sprays each nostril twice a day as needed ?-For itchy or watery eyes can use over-the-counter Pataday or Pataday extra strength 1 drop each eye daily as needed ? ?Eczema ?-Moisturize with a thick lotion after bathing ?-Can use hydrocortisone ointment on eczema flared areas (itchy, dry, red/irritated, rough, sting daily or flaky) twice a day as needed.  This is over-the-counter.  If you are needing something more than hydrocortisone let us know we can provide recommendations ? ?Follow-up in 12 months or sooner if needed ? ?I appreciate the opportunity to take part in [facility] care. Please do not hesitate to contact me with questions. ? ?Sincerely, ? ? ?Prudy Feeler, MD ?Allergy/Immunology ?[facility] ? ? ?

## 2021-07-16 NOTE — Patient Instructions (Addendum)
-  Continue avoidance of pistachio/cashew and hazelnut  ?-Have access to self-injectable epinephrine (Epipen or AuviQ) 0.'3mg'$  at all times ?-Follow emergency action plan in case of allergic reaction ? ?-Continue avoidance measures for grasses, weeds, trees, molds, dust mite, cat and horse ?-For general allergy symptom relief can use long-acting antihistamines like Zyrtec, Allegra or Xyzal daily as needed.  These are over-the-counter options ?-For nasal congestion can use over-the-counter Nasacort, Flonase or Rhinocort 2 sprays each nostril daily for 1-2 weeks at a time before stopping once nasal congestion improves for maximum benefit ?-For nasal drainage can use over-the-counter Astepro 2 sprays each nostril twice a day as needed ?-For itchy or watery eyes can use over-the-counter Pataday or Pataday extra strength 1 drop each eye daily as needed ? ?-Moisturize with a thick lotion after bathing ?-Can use hydrocortisone ointment on eczema flared areas (itchy, dry, red/irritated, rough, sting daily or flaky) twice a day as needed.  This is over-the-counter.  If you are needing something more than hydrocortisone let us know we can provide recommendations ? ?Follow-up in 12 months or sooner if needed ? ?

## 2021-10-07 ENCOUNTER — Telehealth: Payer: Self-pay

## 2021-10-07 ENCOUNTER — Encounter: Payer: Self-pay | Admitting: Cardiovascular Disease

## 2021-10-07 NOTE — Telephone Encounter (Signed)
Error

## 2021-10-07 NOTE — Telephone Encounter (Signed)
   Pre-operative Risk Assessment    Patient Name: Lisa Mosley  DOB: 11-04-1975 MRN: [id number]      Request for Surgical Clearance    Procedure:   Bariatric Surgery (SADI)  Date of Surgery:  Clearance TBD                                 Surgeon:  Bynum Bellows Surgeon's Group or Practice Name:  [facility] Phone number:  [phone]  Fax number:  [phone]   Type of Clearance Requested:   - Medical    Type of Anesthesia:  General    Additional requests/questions:    Signed, Lakynn Halvorsen   10/07/2021, 1:55 PM

## 2021-10-08 ENCOUNTER — Telehealth: Payer: Self-pay | Admitting: *Deleted

## 2021-10-08 NOTE — Telephone Encounter (Signed)
Pt agreeable to plan of care for tele pre op appt 10/12/21 @ 1 pm. Med rec and consent are done.     Patient Consent for Virtual Visit        Lisa Mosley has provided verbal consent on 10/08/2021 for a virtual visit (video or telephone).   CONSENT FOR VIRTUAL VISIT FOR:  Lisa Mosley  By participating in this virtual visit I agree to the following:  I hereby voluntarily request, consent and authorize [facility] and its employed or contracted physicians, physician assistants, nurse practitioners or other licensed health care professionals (the Practitioner), to provide me with telemedicine health care services (the "Services") as deemed necessary by the treating Practitioner. I acknowledge and consent to receive the Services by the Practitioner via telemedicine. I understand that the telemedicine visit will involve communicating with the Practitioner through live audiovisual communication technology and the disclosure of certain medical information by electronic transmission. I acknowledge that I have been given the opportunity to request an in-person assessment or other available alternative prior to the telemedicine visit and am voluntarily participating in the telemedicine visit.  I understand that I have the right to withhold or withdraw my consent to the use of telemedicine in the course of my care at any time, without affecting my right to future care or treatment, and that the Practitioner or I may terminate the telemedicine visit at any time. I understand that I have the right to inspect all information obtained and/or recorded in the course of the telemedicine visit and may receive copies of available information for a reasonable fee.  I understand that some of the potential risks of receiving the Services via telemedicine include:  Delay or interruption in medical evaluation due to technological equipment failure or disruption; Information transmitted may not be sufficient (e.g. poor  resolution of images) to allow for appropriate medical decision making by the Practitioner; and/or  In rare instances, security protocols could fail, causing a breach of personal health information.  Furthermore, I acknowledge that it is my responsibility to provide information about my medical history, conditions and care that is complete and accurate to the best of my ability. I acknowledge that Practitioner's advice, recommendations, and/or decision may be based on factors not within their control, such as incomplete or inaccurate data provided by me or distortions of diagnostic images or specimens that may result from electronic transmissions. I understand that the practice of medicine is not an exact science and that Practitioner makes no warranties or guarantees regarding treatment outcomes. I acknowledge that a copy of this consent can be made available to me via my patient portal ([facility]), or I can request a printed copy by calling the office of [facility].    I understand that my insurance will be billed for this visit.   I have read or had this consent read to me. I understand the contents of this consent, which adequately explains the benefits and risks of the Services being provided via telemedicine.  I have been provided ample opportunity to ask questions regarding this consent and the Services and have had my questions answered to my satisfaction. I give my informed consent for the services to be provided through the use of telemedicine in my medical care

## 2021-10-08 NOTE — Telephone Encounter (Signed)
    Name: Lisa Mosley  DOB: 05/19/1975  MRN: [id number]  Primary Cardiologist: Lauree Chandler, MD   Preoperative team, please contact this patient and set up a phone call appointment for further preoperative risk assessment. Please obtain consent and complete medication review. Thank you for your help.  I confirm that guidance regarding antiplatelet and oral anticoagulation therapy has been completed and, if necessary, noted below.   Lenna Sciara, NP 10/08/2021, 1:36 PM [place] [address] [place], [place] [address]

## 2021-10-08 NOTE — Telephone Encounter (Signed)
Pt agreeable to plan of care for tele pre op appt 10/12/21 @ 1 pm. Med rec and consent are done.

## 2021-10-08 NOTE — Telephone Encounter (Signed)
Left message for the pt to call the office to schedule a tele pre op appt 

## 2021-10-12 ENCOUNTER — Encounter: Payer: Self-pay | Admitting: Nurse Practitioner

## 2021-10-12 ENCOUNTER — Ambulatory Visit (INDEPENDENT_AMBULATORY_CARE_PROVIDER_SITE_OTHER): Payer: BC Managed Care – PPO | Admitting: Nurse Practitioner

## 2021-10-12 DIAGNOSIS — Z1231 Encounter for screening mammogram for malignant neoplasm of breast: Secondary | ICD-10-CM

## 2021-10-12 DIAGNOSIS — Z01810 Encounter for preprocedural cardiovascular examination: Secondary | ICD-10-CM | POA: Diagnosis not present

## 2021-10-12 NOTE — Progress Notes (Signed)
Virtual Visit via Telephone Note   Because of Lisa Mosley's co-morbid illnesses, she is at least at moderate risk for complications without adequate follow up.  This format is felt to be most appropriate for this patient at this time.  The patient did not have access to video technology/had technical difficulties with video requiring transitioning to audio format only (telephone).  All issues noted in this document were discussed and addressed.  No physical exam could be performed with this format.  Please refer to the patient's chart for her consent to telehealth for [facility].  Evaluation Performed:  Preoperative cardiovascular risk assessment _____________   Date:  10/12/2021   Patient ID:  Lisa Mosley, DOB 48-Dec-1977, MRN [id number] Patient Location:  Home Provider location:   Office  Primary Care Provider:  Alroy Dust, L.Marlou Sa, MD Primary Cardiologist:  Lauree Chandler, MD  Chief Complaint / Patient Profile   46 y.o. 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  Past Medical History    Past Medical History:  Diagnosis Date   HTN (hypertension)    Migraines    Nephrolithiasis    PVC (premature ventricular contraction)    Past Surgical History:  Procedure Laterality Date   ABDOMINAL HYSTERECTOMY     APPENDECTOMY     CHOLECYSTECTOMY     HERNIA REPAIR     TUBAL LIGATION     Now reversed    Allergies  Allergies  Allergen Reactions   Nitrofurantoin Rash   Prochlorperazine     Other reaction(s): Other Bugs crawling Other reaction(s): Other   Hydrocodone Itching   Latex Rash    History of Present Illness    Lisa Mosley is a 46 y.o. female who presents via audio/video conferencing for a telehealth visit today.  Pt was last seen in cardiology clinic on 07/14/21 by Oda Kilts, PA. At that  time Lisa Mosley was doing well.  The patient is now pending procedure as outlined above. Since her last visit, she denies chest pain, lower extremity edema, fatigue, palpitations, melena, hematuria, hemoptysis, diaphoresis, weakness, presyncope, syncope, orthopnea, and PND. Recently starting walking for exercise 30 minutes per day and has mild SOB. She feels this is related to deconditioning and has no further concerns. She can achieve > 4 METS activity without difficulty.    Home Medications    Prior to Admission medications   Medication Sig Start Date End Date Taking? Authorizing Provider  busPIRone (BUSPAR) 10 MG tablet Take 1 tablet by mouth in the morning and at bedtime.    [provider]  diltiazem (CARDIZEM CD) 180 MG 24 hr capsule Take 1 capsule (180 mg total) by mouth daily. 10/08/20   Shirley Friar, PA-C  escitalopram (LEXAPRO) 20 MG tablet Take 20 mg by mouth daily. 07/12/21   [provider]  ferrous sulfate 325 (65 FE) MG tablet Take 325 mg by mouth daily with breakfast. Patient not taking: Reported on 10/08/2021    [provider]  flecainide (TAMBOCOR) 100 MG tablet TAKE 1 TABLET BY MOUTH TWICE A DAY 02/10/21   Camnitz, Will Hassell Done, MD  guanFACINE (TENEX) 1 MG tablet Take 0.5 mg by mouth 2 (two) times daily. Patient not taking: Reported on 10/08/2021 07/06/21   [provider]  losartan (COZAAR) 100 MG tablet Take 1 tablet (100 mg total) by mouth daily. 10/08/20   Shirley Friar, PA-C  Physical Exam    Vital Signs:  Lisa Mosley does not have vital signs available for review today.  Given telephonic nature of communication, physical exam is limited. AAOx3. NAD. Normal affect.  Speech and respirations are unlabored.  Accessory Clinical Findings    None  Assessment & Plan    1.  Preoperative Cardiovascular Risk Assessment: She is doing well from a cardiac perspective and may proceed to surgery without further testing.  According to the Revised Cardiac Risk Index (RCRI), her Perioperative Risk of Major Cardiac Event is (%): 0.9. Her Functional Capacity in METs is: 7.59 according to the Duke Activity Status Index (DASI).   A copy of this note will be routed to requesting surgeon.  Time:   Today, I have spent 10 minutes with the patient with telehealth technology discussing medical history, symptoms, and management plan.     Lisa Life, NP-C    10/12/2021, 1:05 PM [place] [address], [address] Office [phone] Fax [phone]

## 2021-10-14 ENCOUNTER — Other Ambulatory Visit: Payer: Self-pay | Admitting: Student

## 2021-10-14 ENCOUNTER — Other Ambulatory Visit: Payer: Self-pay | Admitting: Family Medicine

## 2021-10-14 DIAGNOSIS — R945 Abnormal results of liver function studies: Secondary | ICD-10-CM

## 2021-10-21 ENCOUNTER — Ambulatory Visit
Admission: RE | Admit: 2021-10-21 | Discharge: 2021-10-21 | Disposition: A | Discharge status: Home / Self Care | Payer: BC Managed Care – PPO | Source: Ambulatory Visit | Attending: Family Medicine | Admitting: Family Medicine

## 2021-10-21 DIAGNOSIS — R945 Abnormal results of liver function studies: Secondary | ICD-10-CM

## 2021-11-08 ENCOUNTER — Other Ambulatory Visit: Payer: Self-pay | Admitting: Cardiology

## 2022-01-18 ENCOUNTER — Encounter: Payer: Self-pay | Admitting: Cardiology

## 2022-01-18 ENCOUNTER — Ambulatory Visit: Payer: BC Managed Care – PPO | Attending: Cardiology | Admitting: Cardiology

## 2022-01-18 VITALS — BP 114/80 | HR 88 | Ht 66.0 in | Wt 215.0 lb

## 2022-01-18 DIAGNOSIS — I493 Ventricular premature depolarization: Secondary | ICD-10-CM

## 2022-01-18 DIAGNOSIS — I10 Essential (primary) hypertension: Secondary | ICD-10-CM | POA: Diagnosis not present

## 2022-01-18 NOTE — Progress Notes (Signed)
Electrophysiology Office Note   Date:  01/18/2022   ID:  Lisa Mosley, DOB [birth year], MRN [id number]  PCP:  Alroy Dust, Carlean Jews.Marlou Sa, MD  Cardiologist:  Angelena Form Primary Electrophysiologist:  Jaquita Bessire Meredith Leeds, MD    No chief complaint on file.    History of Present Illness: Lisa Mosley is a 46 y.o. female who is being seen today for the evaluation of PVCs at the request of Lisa Mosley. Presenting today for electrophysiology evaluation.    She initially presented to cardiology clinic 06/15/2017 with fatigue and an abnormal ECG.  She was found to have frequent PVCs.  She wore a cardiac monitor with an elevated burden.  She is now on flecainide and diltiazem.  Today, denies symptoms of palpitations, chest pain, shortness of breath, orthopnea, PND, lower extremity edema, claudication, dizziness, presyncope, syncope, bleeding, or neurologic sequela. The patient is tolerating medications without difficulties.  Since being seen she has done well.  She has had no chest pain or shortness of breath.  She is able do all of her daily activities without restriction.  She has noted no further PVCs.  She recently had bariatric surgery.  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  She requires no further therapy for the tumor.    Past Medical History:  Diagnosis Date   HTN (hypertension)    Migraines    Nephrolithiasis    PVC (premature ventricular contraction)    Past Surgical History:  Procedure Laterality Date   ABDOMINAL HYSTERECTOMY     APPENDECTOMY     CHOLECYSTECTOMY     HERNIA REPAIR     TUBAL LIGATION     Now reversed     Current Outpatient Medications  Medication Sig Dispense Refill   busPIRone (BUSPAR) 10 MG tablet Take 1 tablet by mouth in the morning and at bedtime.     diltiazem (CARDIZEM CD) 180 MG 24 hr capsule TAKE 1 CAPSULE BY MOUTH EVERY DAY 90 capsule 3   escitalopram (LEXAPRO) 20 MG tablet Take 20 mg by mouth daily.      ferrous sulfate 325 (65 FE) MG tablet Take 325 mg by mouth daily with breakfast.     flecainide (TAMBOCOR) 100 MG tablet TAKE 1 TABLET BY MOUTH TWICE A DAY 180 tablet 3   No current facility-administered medications for this visit.    Allergies:   Nitrofurantoin, Prochlorperazine, Hydrocodone, and Latex   Social History:  The patient  reports that she has quit smoking. Her smoking use included cigarettes. She has a 5.00 pack-year smoking history. She has never used smokeless tobacco. She reports current alcohol use. She reports that she does not use drugs.   Family History:  The patient's family history includes Heart attack in her father.   ROS:  Please see the history of present illness.   Otherwise, review of systems is positive for none.   All other systems are reviewed and negative.   PHYSICAL EXAM: VS:  BP 114/80   Pulse 88   Ht '5\' 6"'$  (1.676 m)   Wt 215 lb (97.5 kg)   SpO2 96%   BMI 34.70 kg/m  , BMI Body mass index is 34.7 kg/m. GEN: Well nourished, well developed, in no acute distress  HEENT: normal  Neck: no JVD, carotid bruits, or masses Cardiac: RRR; no murmurs, rubs, or gallops,no edema  Respiratory:  clear to auscultation bilaterally, normal work of breathing GI: soft, nontender, nondistended, + BS MS: no deformity or atrophy  Skin: warm and dry Neuro:  Strength and sensation are intact Psych: euthymic mood, full affect  EKG:  EKG is ordered today. Personal review of the ekg ordered shows sinus rhythm, rate 88  Recent Labs: 07/14/2021: BUN 8; Creatinine, Ser 0.78; Potassium 4.2; Sodium 138    Lipid Panel  No results found for: "CHOL", "TRIG", "HDL", "CHOLHDL", "VLDL", "LDLCALC", "LDLDIRECT"   Wt Readings from Last 3 Encounters:  01/18/22 215 lb (97.5 kg)  07/16/21 252 lb 9.6 oz (114.6 kg)  07/14/21 253 lb (114.8 kg)      Other studies Reviewed: Additional studies/ records that were reviewed today include: Stress echo 11/24/2020 Review of the above  records today demonstrates:  1. This is a negative stress echocardiogram for ischemia.   2. LVEF augments with stress, and LVESV decreases with stress. LV  regional wall motion is grossly normal at peak stress.   3. This is a low risk study.   Cardiac monitor 05/20/2019 personally reviewed Max 157 bpm 11:51am, 12/30 Min 67 bpm 07:15am, 12/31 Avg 101 bpm 25.2% PVCs Rare PACs Predominant rhythm sinus rhythm  ASSESSMENT AND PLAN:  1.  PVCs: Cardiac monitor showed a 25% burden.  Currently on diltiazem 180 mg daily, flecainide 100 mg twice daily.  High risk medication monitoring for flecainide.  She is currently feeling well.  She has noted no further PVCs.  She is overall quite happy with her control.  2.  Hypertension: Currently well controlled  3.  Syncope: No further episodes   Current medicines are reviewed at length with the patient today.   The patient does not have concerns regarding her medicines.  The following changes were made today: None  Labs/ tests ordered today include:  Orders Placed This Encounter  Procedures   EKG 12-Lead    Disposition:   FU 6 months  Signed, Lakin Romer Meredith Leeds, MD  01/18/2022 9:59 AM     [facility] [address] [place] [address] [phone] (office) [phone] (fax)

## 2022-01-18 NOTE — Patient Instructions (Signed)
Medication Instructions:  Your physician recommends that you continue on your current medications as directed. Please refer to the Current Medication list given to you today.  *If you need a refill on your cardiac medications before your next appointment, please call your pharmacy*   Lab Work: None ordered  Testing/Procedures: None ordered   Follow-Up: At [facility], you and your health needs are our priority.  As part of our continuing mission to provide you with exceptional heart care, we have created designated Provider Care Teams.  These Care Teams include your primary Cardiologist (physician) and Advanced Practice Providers (APPs -  Physician Assistants and Nurse Practitioners) who all work together to provide you with the care you need, when you need it.  Your next appointment:   6 month(s)  The format for your next appointment:   In Person  Provider:   Legrand Como "Jonni Sanger" Chalmers Cater, PA-C    Thank you for choosing [facility]!!   Trinidad Curet, RN [phone]  Other Instructions   Important Information About Sugar

## 2022-01-23 ENCOUNTER — Ambulatory Visit (HOSPITAL_BASED_OUTPATIENT_CLINIC_OR_DEPARTMENT_OTHER)
Admission: RE | Admit: 2022-01-23 | Discharge: 2022-01-23 | Disposition: A | Discharge status: Home / Self Care | Payer: BC Managed Care – PPO | Source: Ambulatory Visit | Attending: Diagnostic Radiology | Admitting: Diagnostic Radiology

## 2022-01-23 DIAGNOSIS — Z1231 Encounter for screening mammogram for malignant neoplasm of breast: Secondary | ICD-10-CM | POA: Diagnosis present

## 2022-04-27 ENCOUNTER — Telehealth: Payer: Self-pay | Admitting: Cardiovascular Disease

## 2022-04-27 NOTE — Telephone Encounter (Signed)
STAT if patient feels like he/she is going to faint   Are you dizzy now? No   Do you feel faint or have you passed out? No   Do you have any other symptoms? Fatigue and low HR   Have you checked your HR and BP (record if available)? HR getting down to the 40's was in the 70's at time of call   Patient states dizziness and fatigue started last  Wednesday 12/20 and her HR got down to the 40's last night. Reports her BP has been normal. She is requesting an appt regarding symptoms. Please advise.

## 2022-04-27 NOTE — Telephone Encounter (Signed)
Returned call to patient who states that approx 6 days ago, she began to feel nauseous, especially with positional changes, and dizziness. Condones that she's still on Dilt '180mg'$  and Flecanide '100mg'$  twice daily and denies any changes in her PVC's. Feels more tired than usual. Noticed HR on smart watch was low. States it dipped down in the 40's as of last night, any time she was resting, it was remaining in the 40's. States she has lost "a lot of weigh" d/t bariatric surgery in August. Per OV note with [facility] on 01/18/22 she weighed 215lb, now weighs 175lb (loss of 40lbs) and likely needs an adjustment to medications. States BP last night was 120/79 when feeling poorly and HR of 40. Will route to MD for review and potential adjustment to medication. Advised patient to hold Diltiazem (even though I feel like it's the Flecanide that really should be adjusted) and keep an eye on BP.

## 2022-05-03 ENCOUNTER — Ambulatory Visit (HOSPITAL_COMMUNITY)
Admission: EM | Admit: 2022-05-03 | Discharge: 2022-05-03 | Disposition: A | Discharge status: Home / Self Care | Payer: BC Managed Care – PPO | Attending: Internal Medicine | Admitting: Internal Medicine

## 2022-05-03 ENCOUNTER — Encounter (HOSPITAL_COMMUNITY): Payer: Self-pay

## 2022-05-03 DIAGNOSIS — R1033 Periumbilical pain: Secondary | ICD-10-CM | POA: Diagnosis present

## 2022-05-03 HISTORY — DX: Benign neoplasm of stomach: D13.1

## 2022-05-03 LAB — COMPREHENSIVE METABOLIC PANEL
ALT: 55 U/L — ABNORMAL HIGH (ref 0–44)
AST: 34 U/L (ref 15–41)
Albumin: 4.3 g/dL (ref 3.5–5.0)
Alkaline Phosphatase: 82 U/L (ref 38–126)
Anion gap: 11 (ref 5–15)
BUN: 23 mg/dL — ABNORMAL HIGH (ref 6–20)
CO2: 25 mmol/L (ref 22–32)
Calcium: 9.3 mg/dL (ref 8.9–10.3)
Chloride: 102 mmol/L (ref 98–111)
Creatinine, Ser: 0.86 mg/dL (ref 0.44–1.00)
GFR, Estimated: >60 mL/min (ref >60)
Glucose, Bld: 84 mg/dL (ref 70–99)
Potassium: 4.3 mmol/L (ref 3.5–5.1)
Sodium: 138 mmol/L (ref 135–145)
Total Bilirubin: 0.5 mg/dL (ref 0.3–1.2)
Total Protein: 7.2 g/dL (ref 6.5–8.1)

## 2022-05-03 LAB — CBC
HCT: 41.8 % (ref 36.0–46.0)
Hemoglobin: 13.8 g/dL (ref 12.0–15.0)
MCH: 31 pg (ref 26.0–34.0)
MCHC: 33 g/dL (ref 30.0–36.0)
MCV: 93.9 fL (ref 80.0–100.0)
Platelets: 169 10*3/uL (ref 150–400)
RBC: 4.45 MIL/uL (ref 3.87–5.11)
RDW: 12.4 % (ref 11.5–15.5)
WBC: 6.8 10*3/uL (ref 4.0–10.5)
nRBC: 0 % (ref 0.0–0.2)

## 2022-05-03 MED ORDER — IBUPROFEN 100 MG/5ML PO SUSP
400.0000 mg | Freq: Once | ORAL | Status: AC
  Administered 2022-05-03: 400 mg via ORAL

## 2022-05-03 MED ORDER — TIZANIDINE HCL 2 MG PO CAPS: 2.0000 mg | ORAL_CAPSULE | Freq: Three times a day (TID) | ORAL | 0 refills | Status: DC

## 2022-05-03 MED ORDER — IBUPROFEN 100 MG/5ML PO SUSP
ORAL | Status: AC
  Filled 2022-05-03: qty 20

## 2022-05-03 NOTE — ED Provider Notes (Signed)
[facility]    CSN: [id number] Arrival date & time: 05/03/22  1525      History   Chief Complaint No chief complaint on file.   HPI Lisa Mosley is a 47 y.o. female.   Patient presents urgent care for evaluation of right lower quadrant abdominal pain that started suddenly this afternoon and has been constant since onset.  She describes the pain as a "sharp stabbing pain that is sometimes a pulling sensation".  Pain is worse with activity that cause and decrease in intra-abdominal pressure such as laughing and movement.  Pain is focal to the right outer aspect of the periumbilical region and is currently an 8 on a scale of 0-10.  Patient is 5 months status postop gastric bypass and states her incisions have healed normally without evidence of infection.  No recent antibiotic use, nausea, vomiting, diarrhea, constipation, shortness of breath, changes in diet, or recent intake of new foods.  She has had a normal appetite and denies viral URI symptoms and fever/chills.  Last normal bowel movement was today.  She states she defecates twice daily and her bowel movements have been normal recently in color/consistency and she has not had any blood or mucus to the stools.  Denies urinary symptoms, vaginal symptoms, and flank pain.  She is passing gas normally but states this does not feel like gas pain.  History of appendectomy, cholecystectomy, and hysterectomy/salpingectomy.  She has not attempted use of any over the counter medicines prior to arrival urgent care for symptoms.    Past Medical History:  Diagnosis Date  . HTN (hypertension)   . Migraines   . Nephrolithiasis   . PVC (premature ventricular contraction)   . Stomach tumor (benign)     Patient Active Problem List   Diagnosis Date Noted  . PVC's (premature ventricular contractions) 07/13/2017  . Fatigue 07/13/2017  . Hypertension 07/13/2017  . Severe dysplasia of cervix 04/17/2015    Past Surgical History:   Procedure Laterality Date  . ABDOMINAL HYSTERECTOMY    . APPENDECTOMY    . CHOLECYSTECTOMY    . HERNIA REPAIR    . 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)    . TUBAL LIGATION     Now reversed    OB History   No obstetric history on file.      Home Medications    Prior to Admission medications   Medication Sig Start Date End Date Taking? Authorizing Provider  tizanidine (ZANAFLEX) 2 MG capsule Take 1 capsule (2 mg total) by mouth 3 (three) times daily. 05/03/22  Yes Talbot Grumbling, FNP  busPIRone (BUSPAR) 10 MG tablet Take 1 tablet by mouth in the morning and at bedtime.    [provider]  diltiazem (CARDIZEM CD) 180 MG 24 hr capsule TAKE 1 CAPSULE BY MOUTH EVERY DAY 10/14/21   Shirley Friar, PA-C  escitalopram (LEXAPRO) 20 MG tablet Take 20 mg by mouth daily. 07/12/21   [provider]  ferrous sulfate 325 (65 FE) MG tablet Take 325 mg by mouth daily with breakfast.    [provider]  flecainide (TAMBOCOR) 100 MG tablet TAKE 1 TABLET BY MOUTH TWICE A DAY 11/09/21   Camnitz, Ocie Doyne, MD    Family History Family History  Problem Relation Age of Onset  . Heart attack Father        88s    Social History Social History   Tobacco Use  . Smoking status: Former  Packs/day: 0.50    Years: 10.00    Total pack years: 5.00    Types: Cigarettes  . Smokeless tobacco: Never  Vaping Use  . Vaping Use: Never used  Substance Use Topics  . Alcohol use: Yes    Comment: Rare  . Drug use: No     Allergies   Nitrofurantoin, Prochlorperazine, Hydrocodone, and Latex   Review of Systems Review of Systems Per HPI  Physical Exam Triage Vital Signs ED Triage Vitals  Enc Vitals Group     BP 05/03/22 1626 130/86     Pulse Rate 05/03/22 1626 72     Resp 05/03/22 1626 20     Temp 05/03/22 1626 98 F (36.7 C)     Temp Source 05/03/22 1626 Oral     SpO2 05/03/22 1626 96 %     Weight --      Height --       Head Circumference --      Peak Flow --      Pain Score 05/03/22 1627 8     Pain Loc --      Pain Edu? --      Excl. in [place]? --    No data found.  Updated Vital Signs BP 130/86 (BP Location: Right Arm)   Pulse 72   Temp 98 F (36.7 C) (Oral)   Resp 20   SpO2 96%   Visual Acuity Right Eye Distance:   Left Eye Distance:   Bilateral Distance:    Right Eye Near:   Left Eye Near:    Bilateral Near:     Physical Exam   UC Treatments / Results  Labs (all labs ordered are listed, but only abnormal results are displayed) Labs Reviewed  CBC  COMPREHENSIVE METABOLIC PANEL    EKG   Radiology No results found.  Procedures Procedures (including critical care time)  Medications Ordered in UC Medications  ibuprofen (ADVIL) 100 MG/5ML suspension 400 mg (has no administration in time range)    Initial Impression / Assessment and Plan / UC Course  I have reviewed the triage vital signs and the nursing notes.  Pertinent labs & imaging results that were available during my care of the patient were reviewed by me and considered in my medical decision making (see chart for details).     *** Final Clinical Impressions(s) / UC Diagnoses   Final diagnoses:  Periumbilical abdominal pain     Discharge Instructions      It is unclear what is causing your abdominal pain. Your exam today is reassuring overall.  I would like for you to take ibuprofen 400 mg every 4-6 hours as needed for abdominal discomfort. You may also take Tylenol 1000 mg every 6 hours as needed for abdominal discomfort.  You may also take Zanaflex muscle relaxer 2 mg every 8 hours as needed for abdominal discomfort as this may be muscle spasm related.  Take this mostly at bedtime as this medicine can make you very sleepy.  Do not drive or drink alcohol when taking Zanaflex.  Apply heat to the abdomen 20 minutes on 20 minutes off.  You may perform Epsom salt baths to reduce pain as well.  If  you develop a fever, chills, worsening abdominal pain that does not respond well to medications, or any other new or worsening symptoms, I would like for you to go to the nearest emergency department for further evaluation. Otherwise, you may schedule an appointment with your gastrointestinal surgeon for  follow-up. Return to urgent care as needed.  I hope you feel better!    ED Prescriptions     Medication Sig Dispense Auth. Provider   tizanidine (ZANAFLEX) 2 MG capsule Take 1 capsule (2 mg total) by mouth 3 (three) times daily. 20 capsule Talbot Grumbling, FNP      PDMP not reviewed this encounter.

## 2022-05-03 NOTE — ED Triage Notes (Signed)
Pt reports right side stomach pain that started today. Pt didn't take any meds today for pain

## 2022-05-03 NOTE — Discharge Instructions (Addendum)
It is unclear what is causing your abdominal pain. Your exam today is reassuring overall.  I would like for you to take ibuprofen 400 mg every 4-6 hours as needed for abdominal discomfort. You may also take Tylenol 1000 mg every 6 hours as needed for abdominal discomfort.  You may also take Zanaflex muscle relaxer 2 mg every 8 hours as needed for abdominal discomfort as this may be muscle spasm related.  Take this mostly at bedtime as this medicine can make you very sleepy.  Do not drive or drink alcohol when taking Zanaflex.  Apply heat to the abdomen 20 minutes on 20 minutes off.  You may perform Epsom salt baths to reduce pain as well.  If you develop a fever, chills, worsening abdominal pain that does not respond well to medications, or any other new or worsening symptoms, I would like for you to go to the nearest emergency department for further evaluation. Otherwise, you may schedule an appointment with your gastrointestinal surgeon for follow-up. Return to urgent care as needed.  I hope you feel better!

## 2022-05-05 NOTE — Telephone Encounter (Signed)
Left message to call back  (Need to schedule appt per Dr. Curt Bears)

## 2022-05-23 NOTE — Progress Notes (Deleted)
   PCP:  Alroy Dust, L.Marlou Sa, MD Primary Cardiologist: Lauree Chandler, MD Electrophysiologist: Constance Haw, MD   Lisa Mosley is a 47 y.o. female seen today for Will Meredith Leeds, MD for {VISITTYPE:28148}  Past Medical History:  Diagnosis Date   HTN (hypertension)    Migraines    Nephrolithiasis    PVC (premature ventricular contraction)    Stomach tumor (benign)     Current Outpatient Medications  Medication Instructions   busPIRone (BUSPAR) 10 MG tablet 1 tablet, Oral, 2 times daily   diltiazem (CARDIZEM CD) 180 MG 24 hr capsule TAKE 1 CAPSULE BY MOUTH EVERY DAY   escitalopram (LEXAPRO) 20 mg, Oral, Daily   ferrous sulfate 325 mg, Oral, Daily with breakfast   flecainide (TAMBOCOR) 100 MG tablet TAKE 1 TABLET BY MOUTH TWICE A DAY   tizanidine (ZANAFLEX) 2 mg, Oral, 3 times daily    Physical Exam: There were no vitals filed for this visit.  GEN- NAD. A&O x 3. Normal affect. HEENT: normocephalic, atraumatic Lungs- CTAB, Normal effort Heart- {EPRHYTHM:28826}, No M/G/R Extremities- {EDEMA LEVEL:28147::"No"} peripheral edema. no clubbing or cyanosis; Skin- warm and dry, no rash or lesion  EKG is ordered today. Personal review shows ***  Additional studies reviewed include: Previous EP notes.   {Select studies to display:26339}  Assessment and Plan:  1. PVCs Previous monitor showed burden of 25% Minimal symptoms if any.   ECG today shows ***   Continue Flecainide 100 mg BID. No PVCs LVEF 55% by TTE 06/2017   2. HTN Currently well controlled Continue losartan 100 mg daily Continue diltiazem 180 mg daily    3. Syncope No further  Follow up with {WUJWJ:19147} {EPFOLLOW UP:28173}  Shirley Friar, PA-C  05/23/22 2:18 PM

## 2022-05-26 ENCOUNTER — Ambulatory Visit: Payer: BC Managed Care – PPO | Admitting: Student

## 2022-06-14 NOTE — Progress Notes (Unsigned)
Cardiology Office Note Date:  06/15/2022  Patient ID:  Lisa Mosley, Lisa Mosley 05/12/1975, MRN [id number] PCP:  Aurea Graff.Marlou Sa, MD  Cardiologist:  Lauree Chandler, MD Electrophysiologist: Will Meredith Leeds, MD  Chief Complaint: 47yo f/up PVC  History of Present Illness: KDanieal Hauensteinis a 47y.o. female with PMH notable for PVC, HTN; seen today for Will MMeredith Leeds MD for routine electrophysiology followup.  Last saw Dr. 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  She called the office in December c/o dizziness, nausea and bradycardia. Was recommended to hold dilt until appt in clinic.  Today, she states that since that time, she has mostly felt well. Has occasional dizziness when stands up quickly (like at yoga), but able to exercise. She has lost a considerable amount of weight since bariatric surgery and thinks that may have caused her to become intolerant to the dilt. She stays well-hydrated and eats every 2 hours to keep nutrition up.    Not having any PVCs.  Past Medical History:  Diagnosis Date   HTN (hypertension)    Migraines    Nephrolithiasis    PVC (premature ventricular contraction)    Stomach tumor (benign)     Past Surgical History:  Procedure Laterality Date   ABDOMINAL HYSTERECTOMY     APPENDECTOMY     CHOLECYSTECTOMY     HERNIA REPAIR     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)     TUBAL LIGATION     Now reversed    Current Outpatient Medications  Medication Instructions   busPIRone (BUSPAR) 10 MG tablet 1 tablet, Oral, 2 times daily   clobetasol ointment (TEMOVATE) 0.05 % Daily   escitalopram (LEXAPRO) 20 mg, Oral, Daily   ferrous sulfate 325 mg, Oral, Daily with breakfast   flecainide (TAMBOCOR) 100 MG tablet TAKE 1 TABLET BY MOUTH TWICE A DAY   metoprolol succinate (TOPROL XL) 25 mg, Oral, Daily at bedtime    tizanidine (ZANAFLEX) 2 mg, Oral, 3 times daily    Social History:  The patient  reports that she has quit smoking. Her smoking use included cigarettes. She has a 5.00 pack-year smoking history. She has never used smokeless tobacco. She reports current alcohol use. She reports that she does not use drugs.   Family History:  The patient's family history includes Heart attack in her father.  ROS:  Please see the history of present illness. All other systems are reviewed and otherwise negative.   PHYSICAL EXAM:  VS:  BP 104/70 Comment: Right arm 90/80  Pulse 74   Ht 5' 6"$  (1.676 m)   Wt 162 lb (73.5 kg)   SpO2 99%   BMI 26.15 kg/m  BMI: Body mass index is 26.15 kg/m.  GEN- The patient is well appearing, alert and oriented x 3 today.   HEENT: normocephalic, atraumatic; sclera clear, conjunctiva pink; hearing intact; oropharynx clear; neck supple, no JVP Lungs- Clear to ausculation bilaterally, normal work of breathing.  No wheezes, rales, rhonchi Heart- Regular rate and rhythm, no murmurs, rubs or gallops, PMI not laterally displaced GI- soft, non-tender, non-distended, bowel sounds present, no hepatosplenomegaly Extremities- Trace peripheral edema. no clubbing or cyanosis; DP/PT/radial pulses 2+ bilaterally MS- no significant deformity or atrophy Skin- warm and dry, no rash or lesion Psych- euthymic mood, full affect Neuro- strength and sensation are intact   EKG is ordered. Personal review of EKG from today shows:  NSR,  rate 74bpm, stable intervals  Recent Labs: 05/03/2022: ALT 55; BUN 23; Creatinine, Ser 0.86; Hemoglobin 13.8; Platelets 169; Potassium 4.3; Sodium 138  No results found for requested labs within last 365 days.   CrCl cannot be calculated (Patient's most recent lab result is older than the maximum 21 days allowed.).   Wt Readings from Last 3 Encounters:  06/15/22 162 lb (73.5 kg)  01/18/22 215 lb (97.5 kg)  07/16/21 252 lb 9.6 oz (114.6 kg)     Additional  studies reviewed include: Previous EP, cardiology notes.   Echo stress test, 11/24/2020 IMPRESSIONS   1. This is a negative stress echocardiogram for ischemia.   2. LVEF augments with stress, and LVESV decreases with stress. LV  regional wall motion is grossly normal at peak stress.   3. This is a low risk study.   Long Term Monitor, 05/20/2019 Max 157 bpm 11:51am, 12/30 Min 67 bpm 07:15am, 12/31 Avg 101 bpm 25.2% PVCs Rare PACs Predominant rhythm sinus rhythm   ASSESSMENT AND PLAN:  #) PVC No symptomatic burden currently Has been off dilt for a couple weeks, but has continued flecainide 49m Stable intervals on EKG She is agreeable to start toprol xl 49mgnightly to be able to continue flecainide Monitor HR and BP at home, notify office if bradycardic or hypotensive    Current medicines are reviewed at length with the patient today.   The patient does not have concerns regarding her medicines.  The following changes were made today:   START toprol XL [id number]  Labs/ tests ordered today include:  Orders Placed This Encounter  Procedures   EKG 12-Lead     Disposition: Follow up with Dr. CamCurt Bears in 6 months   Signed, SuzMamie LeversP  06/15/22  3:18 PM  Electrophysiology [facility]

## 2022-06-15 ENCOUNTER — Ambulatory Visit: Payer: BC Managed Care – PPO | Attending: Student | Admitting: Cardiology

## 2022-06-15 ENCOUNTER — Encounter: Payer: Self-pay | Admitting: Cardiology

## 2022-06-15 VITALS — BP 104/70 | HR 74 | Ht 66.0 in | Wt 162.0 lb

## 2022-06-15 DIAGNOSIS — I493 Ventricular premature depolarization: Secondary | ICD-10-CM | POA: Diagnosis not present

## 2022-06-15 DIAGNOSIS — I10 Essential (primary) hypertension: Secondary | ICD-10-CM

## 2022-06-15 MED ORDER — METOPROLOL SUCCINATE ER 25 MG PO TB24: 25.0000 mg | ORAL_TABLET | Freq: Every day | ORAL | 3 refills | Status: DC

## 2022-06-15 NOTE — Patient Instructions (Signed)
Medication Instructions:   START TAKING: TOPROL XL 25 MG ONCE A DAY IN THE EVENING   STOP TAKING AND REMOVE THIS MEDICATION FROM YOUR MEDICATION LIST:  DILTIAZEM   *If you need a refill on your cardiac medications before your next appointment, please call your pharmacy*   Lab Work: NONE ORDERED  TODAY    If you have labs (blood work) drawn today and your tests are completely normal, you will receive your results only by: [place] (if you have MyChart) OR A paper copy in the mail If you have any lab test that is abnormal or we need to change your treatment, we will call you to review the results.   Testing/Procedures: NONE ORDERED  TODAY     Follow-Up: At [facility], you and your health needs are our priority.  As part of our continuing mission to provide you with exceptional heart care, we have created designated Provider Care Teams.  These Care Teams include your primary Cardiologist (physician) and Advanced Practice Providers (APPs -  Physician Assistants and Nurse Practitioners) who all work together to provide you with the care you need, when you need it.  We recommend signing up for the patient portal called "MyChart".  Sign up information is provided on this After Visit Summary.  MyChart is used to connect with patients for Virtual Visits (Telemedicine).  Patients are able to view lab/test results, encounter notes, upcoming appointments, etc.  Non-urgent messages can be sent to your provider as well.   To learn more about what you can do with MyChart, go to [email or web address].    Your next appointment:   6 month(s)  Provider:   You may see Will Meredith Leeds, MD or one of the following Advanced Practice Providers on your designated Care Team:   Tommye Standard, [place] Legrand Como "Jonni Sanger" [place], [place] Mamie Levers, NP    Other Instructions

## 2022-08-06 MED ORDER — FLECAINIDE ACETATE 100 MG PO TABS: 50.0000 mg | ORAL_TABLET | Freq: Two times a day (BID) | ORAL | 3 refills | Status: DC

## 2022-08-06 MED ORDER — METOPROLOL TARTRATE 25 MG PO TABS: 12.5000 mg | ORAL_TABLET | Freq: Two times a day (BID) | ORAL | 3 refills | Status: DC

## 2022-08-06 NOTE — Addendum Note (Signed)
Addended by: Franchot Gallo on: 08/06/2022 05:28 PM   Modules accepted: Orders

## 2022-10-19 IMAGING — MG MM DIGITAL SCREENING BILAT W/ TOMO AND CAD
6 of 12 series · 6 of 36 positions shown · non-contrast
Comparison: Previous exam(s).

CLINICAL DATA: Screening.

EXAM:
DIGITAL SCREENING BILATERAL MAMMOGRAM WITH TOMOSYNTHESIS AND CAD
TECHNIQUE: Bilateral screening digital craniocaudal and mediolateral oblique
mammograms were obtained. Bilateral screening digital breast
tomosynthesis was performed. The images were evaluated with
computer-aided detection.

[R MLO synth-2D (1 of 2)]
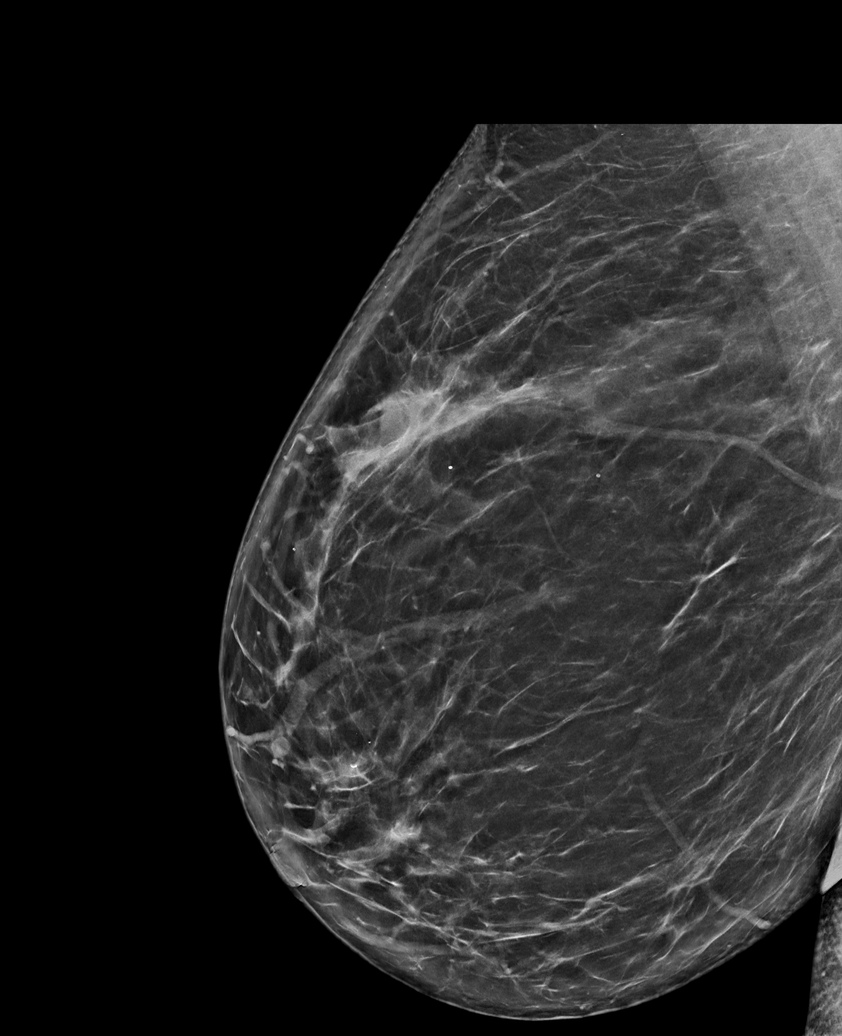

[R MLO synth-2D (2 of 2)]
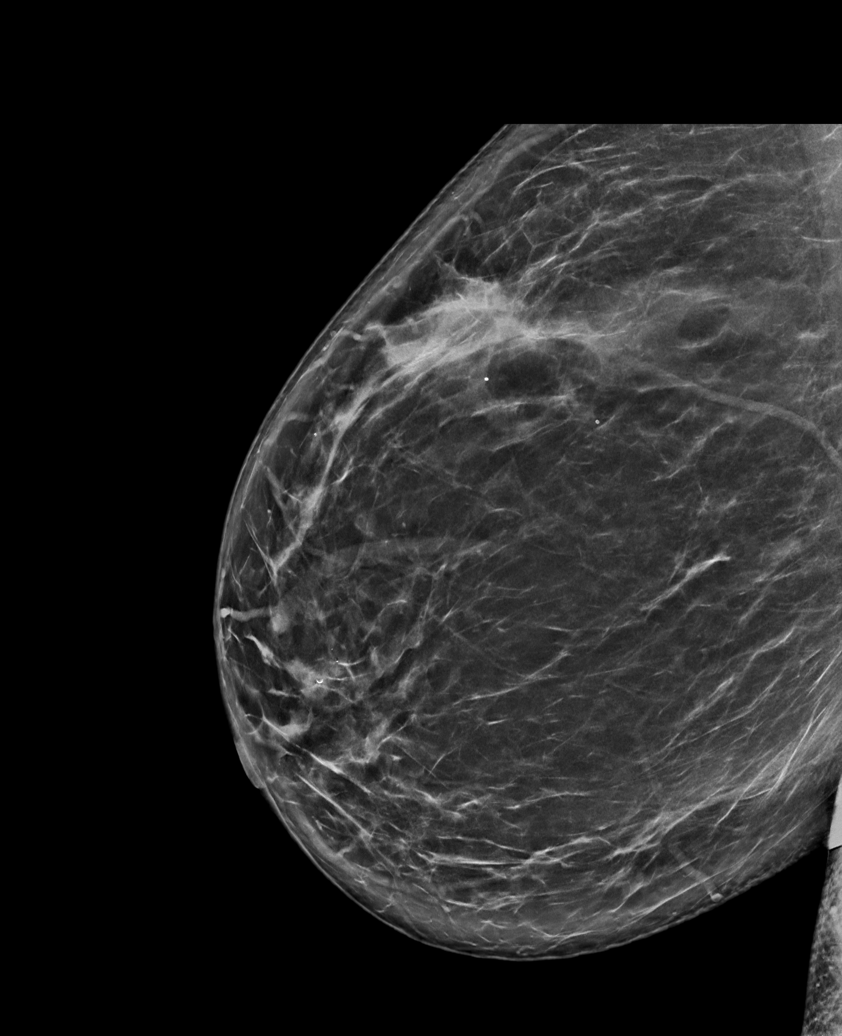

[L CC synth-2D]
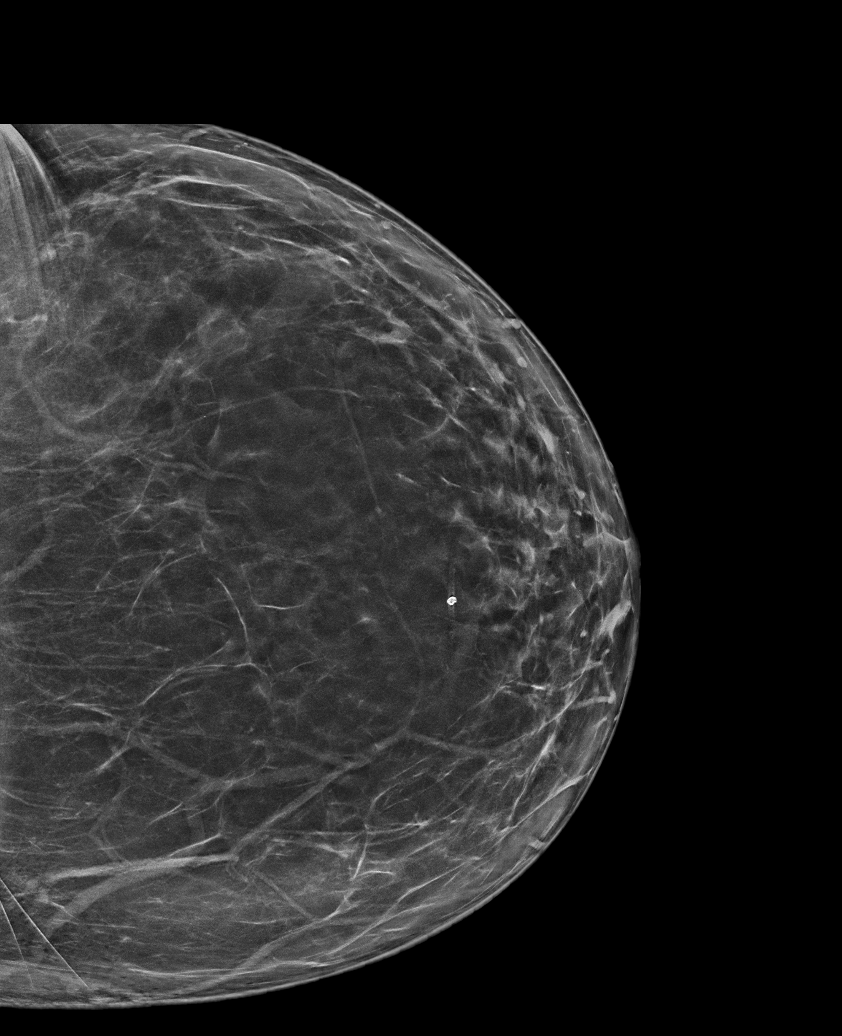

[R CC synth-2D (1 of 2)]
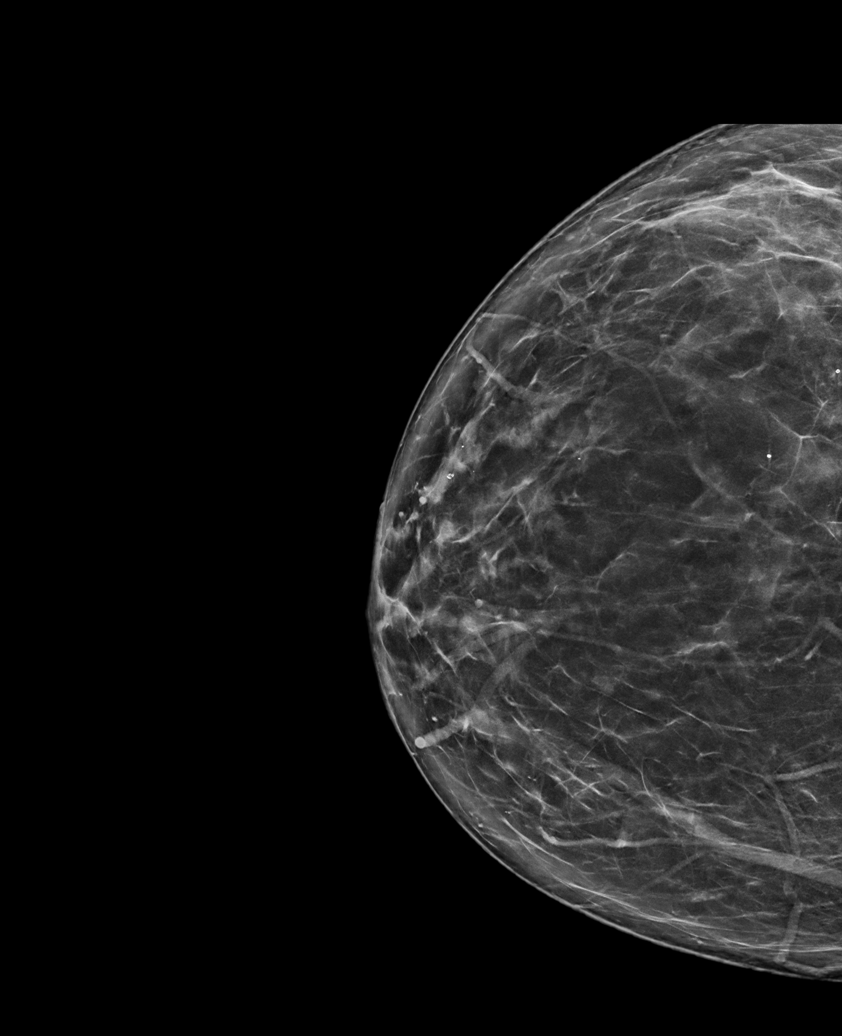

[L MLO synth-2D]
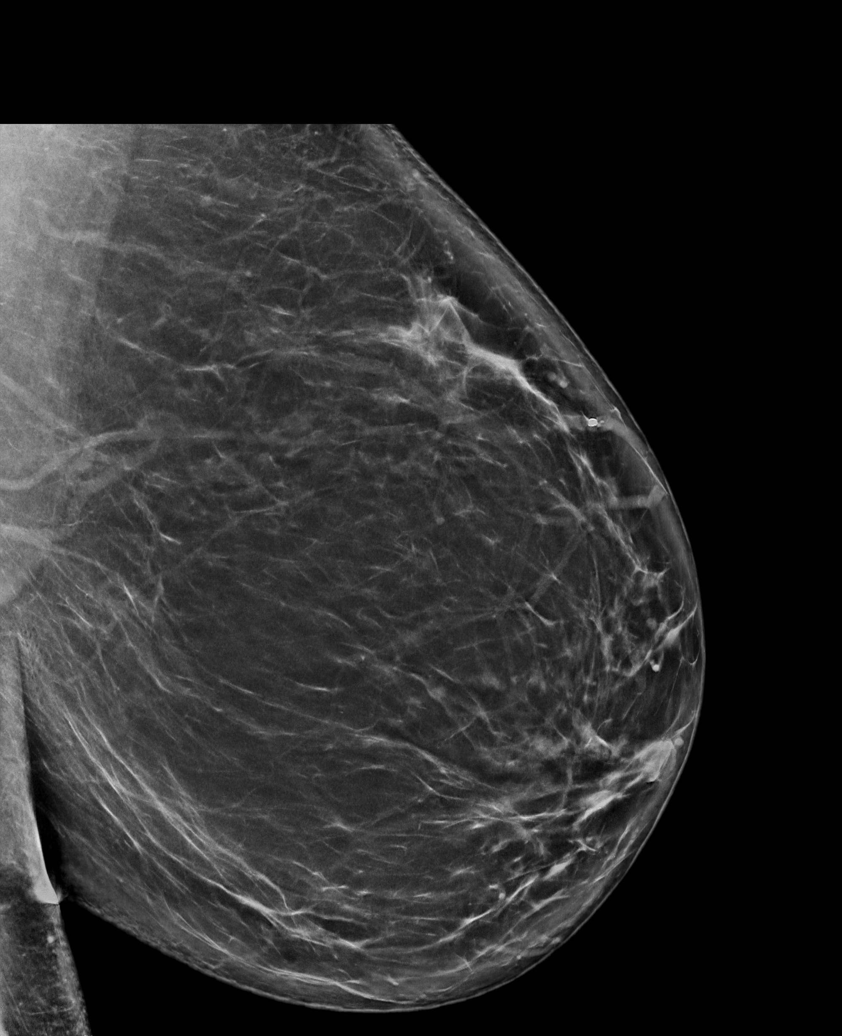

[R CC synth-2D (2 of 2)]
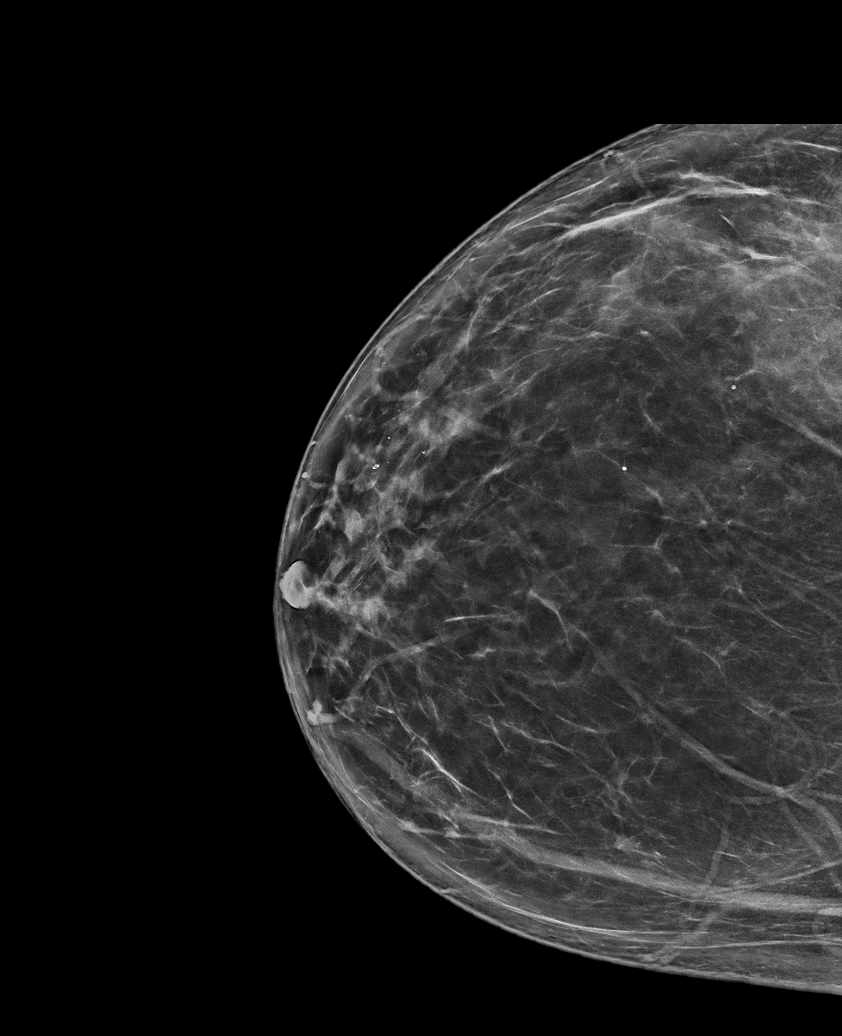

[6 of 36 positions shown; findings below may reference images not displayed]

ACR Breast Density Category b: There are scattered areas of
fibroglandular density.
FINDINGS: There are no findings suspicious for malignancy. The images were
evaluated with computer-aided detection.
IMPRESSION: No mammographic evidence of malignancy. A result letter of this
screening mammogram will be mailed directly to the patient.

RECOMMENDATION:
Screening mammogram in one year. (Code:[id number])

BI-RADS CATEGORY  1: Negative.

## 2022-11-02 ENCOUNTER — Other Ambulatory Visit: Payer: Self-pay

## 2022-11-02 ENCOUNTER — Emergency Department (HOSPITAL_COMMUNITY)
Admission: EM | Admit: 2022-11-02 | Discharge: 2022-11-03 | Disposition: A | Discharge status: Home / Self Care | Payer: BC Managed Care – PPO | Attending: Emergency Medicine | Admitting: Emergency Medicine

## 2022-11-02 DIAGNOSIS — Z91040 Latex allergy status: Secondary | ICD-10-CM | POA: Diagnosis not present

## 2022-11-02 DIAGNOSIS — R001 Bradycardia, unspecified: Secondary | ICD-10-CM | POA: Insufficient documentation

## 2022-11-02 DIAGNOSIS — R110 Nausea: Secondary | ICD-10-CM | POA: Diagnosis present

## 2022-11-02 DIAGNOSIS — I10 Essential (primary) hypertension: Secondary | ICD-10-CM | POA: Insufficient documentation

## 2022-11-02 LAB — CBC
HCT: 34.6 % — ABNORMAL LOW (ref 36.0–46.0)
Hemoglobin: 10.8 g/dL — ABNORMAL LOW (ref 12.0–15.0)
MCH: 31 pg (ref 26.0–34.0)
MCHC: 31.2 g/dL (ref 30.0–36.0)
MCV: 99.4 fL (ref 80.0–100.0)
Platelets: 212 10*3/uL (ref 150–400)
RBC: 3.48 MIL/uL — ABNORMAL LOW (ref 3.87–5.11)
RDW: 13.1 % (ref 11.5–15.5)
WBC: 5.8 10*3/uL (ref 4.0–10.5)
nRBC: 0 % (ref 0.0–0.2)

## 2022-11-02 LAB — BASIC METABOLIC PANEL
Anion gap: 7 (ref 5–15)
BUN: 21 mg/dL — ABNORMAL HIGH (ref 6–20)
CO2: 24 mmol/L (ref 22–32)
Calcium: 8.7 mg/dL — ABNORMAL LOW (ref 8.9–10.3)
Chloride: 102 mmol/L (ref 98–111)
Creatinine, Ser: 0.64 mg/dL (ref 0.44–1.00)
GFR, Estimated: >60 mL/min (ref >60)
Glucose, Bld: 107 mg/dL — ABNORMAL HIGH (ref 70–99)
Potassium: 3.7 mmol/L (ref 3.5–5.1)
Sodium: 133 mmol/L — ABNORMAL LOW (ref 135–145)

## 2022-11-02 NOTE — ED Triage Notes (Signed)
Pt arrives to ED c/o nausea, dizziness and right leg cramping since 130 pm today. Pt states it feels like she is "woozie" Pt states that she has been doing yard work all day and not hydrating well

## 2022-11-03 LAB — HEPATIC FUNCTION PANEL
ALT: 85 U/L — ABNORMAL HIGH (ref 0–44)
AST: 36 U/L (ref 15–41)
Albumin: 3.9 g/dL (ref 3.5–5.0)
Alkaline Phosphatase: 82 U/L (ref 38–126)
Bilirubin, Direct: 0.2 mg/dL (ref 0.0–0.2)
Indirect Bilirubin: 0.3 mg/dL (ref 0.3–0.9)
Total Bilirubin: 0.5 mg/dL (ref 0.3–1.2)
Total Protein: 6.6 g/dL (ref 6.5–8.1)

## 2022-11-03 LAB — MAGNESIUM: Magnesium: 2.3 mg/dL (ref 1.7–2.4)

## 2022-11-03 LAB — TROPONIN I (HIGH SENSITIVITY): Troponin I (High Sensitivity): <2 ng/L (ref <18)

## 2022-11-03 MED ORDER — LACTATED RINGERS IV BOLUS
1000.0000 mL | Freq: Once | INTRAVENOUS | Status: AC
  Administered 2022-11-03: 1000 mL via INTRAVENOUS

## 2022-11-03 MED ORDER — ONDANSETRON HCL 4 MG/2ML IJ SOLN
4.0000 mg | Freq: Once | INTRAMUSCULAR | Status: AC
  Administered 2022-11-03: 4 mg via INTRAVENOUS
  Filled 2022-11-03: qty 2

## 2022-11-03 NOTE — Discharge Instructions (Addendum)
Your hemoglobin was 3 g/dL lower than it was 6 months ago.  This may have contributed to your recent symptoms.  You should have your lab work rechecked to ensure that your hemoglobin is not decreasing further.  When at rest, your heart rate was in the upper 40s today.  This also may have contributed to your symptoms today.  Low heart rate may be related to your heart medications.  You should discuss this further with your cardiologist.  Cardiology referral was ordered.  If you do not hear from their office in the next couple days, call the number below.  Continue to stay hydrated at home.  Return to the emergency department for any new or worsening symptoms of concern.

## 2022-11-03 NOTE — ED Provider Notes (Signed)
[place] EMERGENCY DEPARTMENT AT [facility] Provider Note   CSN: [id number] Arrival date & time: 11/02/22  2044     History  Chief Complaint  Patient presents with   Nausea   Leg Pain    Lisa Mosley is a 47 y.o. female.   Leg Pain Associated symptoms: fatigue   Patient presents for multiple complaints.  Medical history includes HTN, migraine headaches.  Last year, she underwent bariatric surgery.  Over the past 2 days, she has been exerting herself in the heat.  She states that she has not been drinking much water.  Today, at approximately 1:30 PM, she had dizziness, nausea, and cramping to her right foot.  Cramping resolved after approximately 5 minutes.  She has had ongoing dizziness and nausea.  She has not vomited.  She denies any abdominal pain, chest pain, or shortness of breath.     Home Medications Prior to Admission medications   Medication Sig Start Date End Date Taking? Authorizing Provider  busPIRone (BUSPAR) 10 MG tablet Take 1 tablet by mouth in the morning and at bedtime.    [provider]  clobetasol ointment (TEMOVATE) 0.05 % daily. 05/17/22   [provider]  escitalopram (LEXAPRO) 20 MG tablet Take 20 mg by mouth daily. 07/12/21   [provider]  ferrous sulfate 325 (65 FE) MG tablet Take 325 mg by mouth daily with breakfast.    [provider]  flecainide (TAMBOCOR) 100 MG tablet Take 0.5 tablets (50 mg total) by mouth 2 (two) times daily. 08/06/22   Sherie Don, NP  metoprolol tartrate (LOPRESSOR) 25 MG tablet Take 0.5 tablets (12.5 mg total) by mouth 2 (two) times daily. 08/06/22   Sherie Don, NP  tizanidine (ZANAFLEX) 2 MG capsule Take 1 capsule (2 mg total) by mouth 3 (three) times daily. Patient taking differently: Take 2 mg by mouth as needed. 05/03/22   Carlisle Beers, FNP      Allergies    Nitrofurantoin, Prochlorperazine, Hydrocodone, and Latex    Review of Systems   Review of Systems   Constitutional:  Positive for fatigue.  Gastrointestinal:  Positive for nausea.  Musculoskeletal:  Positive for myalgias.  Neurological:  Positive for dizziness and light-headedness.  All other systems reviewed and are negative.   Physical Exam Updated Vital Signs BP 102/66   Pulse (!) 47   Temp (!) 97.5 F (36.4 C) (Oral)   Resp 10   Ht 5\' 6"  (1.676 m)   Wt 63.5 kg   SpO2 100%   BMI 22.60 kg/m  Physical Exam Vitals and nursing note reviewed.  Constitutional:      General: She is not in acute distress.    Appearance: Normal appearance. She is well-developed. She is not ill-appearing, toxic-appearing or diaphoretic.  HENT:     Head: Normocephalic and atraumatic.     Right Ear: External ear normal.     Left Ear: External ear normal.     Nose: Nose normal.     Mouth/Throat:     Mouth: Mucous membranes are moist.  Eyes:     Extraocular Movements: Extraocular movements intact.     Conjunctiva/sclera: Conjunctivae normal.  Cardiovascular:     Rate and Rhythm: Regular rhythm. Bradycardia present.     Heart sounds: No murmur heard. Pulmonary:     Effort: Pulmonary effort is normal. No respiratory distress.     Breath sounds: Normal breath sounds. No wheezing or rales.  Chest:     Chest  wall: No tenderness.  Abdominal:     General: There is no distension.     Palpations: Abdomen is soft.     Tenderness: There is no abdominal tenderness.  Musculoskeletal:        General: No swelling. Normal range of motion.     Cervical back: Normal range of motion and neck supple.     Right lower leg: No edema.     Left lower leg: No edema.  Skin:    General: Skin is warm and dry.     Capillary Refill: Capillary refill takes less than 2 seconds.     Coloration: Skin is not jaundiced or pale.  Neurological:     General: No focal deficit present.     Mental Status: She is alert and oriented to person, place, and time.     Cranial Nerves: No cranial nerve deficit.     Sensory: No  sensory deficit.     Motor: No weakness.     Coordination: Coordination normal.  Psychiatric:        Mood and Affect: Mood normal.        Behavior: Behavior normal.        Thought Content: Thought content normal.        Judgment: Judgment normal.     ED Results / Procedures / Treatments   Labs (all labs ordered are listed, but only abnormal results are displayed) Labs Reviewed  CBC - Abnormal; Notable for the following components:      Result Value   RBC 3.48 (*)    Hemoglobin 10.8 (*)    HCT 34.6 (*)    All other components within normal limits  BASIC METABOLIC PANEL - Abnormal; Notable for the following components:   Sodium 133 (*)    Glucose, Bld 107 (*)    BUN 21 (*)    Calcium 8.7 (*)    All other components within normal limits  HEPATIC FUNCTION PANEL - Abnormal; Notable for the following components:   ALT 85 (*)    All other components within normal limits  MAGNESIUM  TROPONIN I (HIGH SENSITIVITY)    EKG EKG Interpretation Date/Time:  Tuesday November 02 2022 21:39:03 EDT Ventricular Rate:  57 PR Interval:  150 QRS Duration:  88 QT Interval:  432 QTC Calculation: 420 R Axis:   53  Text Interpretation: Sinus bradycardia Low voltage QRS Borderline ECG No previous ECGs available Confirmed by Gloris Manchester ([id number]) on 11/03/2022 12:25:04 AM  Radiology No results found.  Procedures Procedures    Medications Ordered in ED Medications  lactated ringers bolus 1,000 mL (1,000 mLs Intravenous New Bag/Given 11/03/22 0052)  ondansetron (ZOFRAN) injection 4 mg (4 mg Intravenous Given 11/03/22 0052)    ED Course/ Medical Decision Making/ A&P                             Medical Decision Making Amount and/or Complexity of Data Reviewed Labs: ordered.  Risk Prescription drug management.   This patient presents to the ED for concern of dizziness and nausea, this involves an extensive number of treatment options, and is a complaint that carries with it a high risk of  complications and morbidity.  The differential diagnosis includes dehydration, polypharmacy, arrhythmia, symptomatic bradycardia, symptomatic anemia, metabolic derangements   Co morbidities that complicate the patient evaluation  HTN, migraine headaches, bariatric surgery,   Additional history obtained:  Additional history obtained from N/A External records from  outside source obtained and reviewed including EMR   Lab Tests:  I Ordered, and personally interpreted labs.  The pertinent results include: 3 g/dL drop in hemoglobin when compared to lab work from 6 months ago, kidney function and electrolytes are normal.  Troponin is normal.   Cardiac Monitoring: / EKG:  The patient was maintained on a cardiac monitor.  I personally viewed and interpreted the cardiac monitored which showed an underlying rhythm of: Sinus rhythm   Problem List / ED Course / Critical interventions / Medication management  Patient presents for feeling "woozy".  Onset was this afternoon.  Symptoms include lightheadedness, dizziness, nausea.  She had transient cramping to her right foot.  She does state that she has been exerting self in the heat and not adequately rehydrating herself over the past 2 days.  Vital signs on arrival are notable for bradycardia.  Per chart review, her normal heart rate is typically in the 70s.  She is followed by cardiology and is on flecainide as well as metoprolol.  She was previously on diltiazem but this was discontinued after prior episodes of bradycardia.  EKG shows normal sinus rhythm.  Lab work, initiated prior to patient being bedded in the ED shows a 25% drop in her hemoglobin over the past 6 months.  She denies any recent bleeding.  Patient was offered Hemoccult testing but declined.  Given concern of recent dehydration, bolus of IV fluids was ordered.  Patient does endorse current nausea and Zofran was ordered.  Patient was kept on bedside cardiac monitor.  While supine at rest,  patient remained in a sinus bradycardia.  Heart rate was consistently in the range of 48.  Blood pressure was in the range of 100s over 60s.  She states that this is baseline for her.  When stood up, heart rate would increase to range of 70.  Blood pressure would increase to range of 110s over 70s.  This was after IV fluids.  Patient reported significant improvement in symptoms following IV fluids.  Currently, she takes a very small dose of metoprolol.  She states that her cardiologist put her on this to pair with her flecainide.  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  Patient was advised to discuss current medications with her cardiologist, given her findings of bradycardia today.  She was also advised to repeat lab work in the near future for reassessment of her hemoglobin.  Patient was discharged in stable condition. I ordered medication including IV fluids for hydration; Zofran for nausea Reevaluation of the patient after these medicines showed that the patient improved I have reviewed the patients home medicines and have made adjustments as needed   Social Determinants of Health:  Has access to outpatient care         Final Clinical Impression(s) / ED Diagnoses Final diagnoses:  Bradycardia  Nausea    Rx / DC Orders ED Discharge Orders          Ordered    Ambulatory referral to Cardiology       Comments: If you have not heard from the Cardiology office within the next 72 hours please call [phone].   11/03/22 0981              Gloris Manchester, MD 11/03/22 [phone]

## 2022-11-21 ENCOUNTER — Other Ambulatory Visit: Payer: Self-pay

## 2022-11-21 ENCOUNTER — Emergency Department (HOSPITAL_COMMUNITY): Payer: BC Managed Care – PPO

## 2022-11-21 ENCOUNTER — Encounter (HOSPITAL_COMMUNITY): Payer: Self-pay

## 2022-11-21 ENCOUNTER — Emergency Department (HOSPITAL_COMMUNITY)
Admission: EM | Admit: 2022-11-21 | Discharge: 2022-11-21 | Disposition: A | Discharge status: Home / Self Care | Payer: BC Managed Care – PPO | Attending: Emergency Medicine | Admitting: Emergency Medicine

## 2022-11-21 DIAGNOSIS — R0602 Shortness of breath: Secondary | ICD-10-CM | POA: Diagnosis present

## 2022-11-21 DIAGNOSIS — Z91040 Latex allergy status: Secondary | ICD-10-CM | POA: Diagnosis not present

## 2022-11-21 DIAGNOSIS — R002 Palpitations: Secondary | ICD-10-CM | POA: Diagnosis not present

## 2022-11-21 LAB — BASIC METABOLIC PANEL
Anion gap: 7 (ref 5–15)
BUN: 22 mg/dL — ABNORMAL HIGH (ref 6–20)
CO2: 26 mmol/L (ref 22–32)
Calcium: 8.6 mg/dL — ABNORMAL LOW (ref 8.9–10.3)
Chloride: 104 mmol/L (ref 98–111)
Creatinine, Ser: 0.72 mg/dL (ref 0.44–1.00)
GFR, Estimated: >60 mL/min (ref >60)
Glucose, Bld: 105 mg/dL — ABNORMAL HIGH (ref 70–99)
Potassium: 3.9 mmol/L (ref 3.5–5.1)
Sodium: 137 mmol/L (ref 135–145)

## 2022-11-21 LAB — CBC
HCT: 36.5 % (ref 36.0–46.0)
Hemoglobin: 11.2 g/dL — ABNORMAL LOW (ref 12.0–15.0)
MCH: 31.4 pg (ref 26.0–34.0)
MCHC: 30.7 g/dL (ref 30.0–36.0)
MCV: 102.2 fL — ABNORMAL HIGH (ref 80.0–100.0)
Platelets: 215 10*3/uL (ref 150–400)
RBC: 3.57 MIL/uL — ABNORMAL LOW (ref 3.87–5.11)
RDW: 12.8 % (ref 11.5–15.5)
WBC: 6.3 10*3/uL (ref 4.0–10.5)
nRBC: 0 % (ref 0.0–0.2)

## 2022-11-21 LAB — D-DIMER, QUANTITATIVE: D-Dimer, Quant: 0.38 ug/mL-FEU (ref 0.00–0.50)

## 2022-11-21 LAB — TROPONIN I (HIGH SENSITIVITY)
Troponin I (High Sensitivity): <2 ng/L (ref <18)
Troponin I (High Sensitivity): <2 ng/L (ref <18)

## 2022-11-21 NOTE — ED Provider Notes (Signed)
[place] EMERGENCY DEPARTMENT AT [facility] Provider Note   CSN: [id number] Arrival date & time: 11/21/22  1433     History {Add pertinent medical, surgical, social history, OB history to HPI:1} No chief complaint on file.   Lisa Mosley is a 47 y.o. female.  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  Patient reports that yesterday she started experiencing cramping in her right calf.  Says that she has not had any swelling of that leg or injuries.  This morning went to urgent care because she started experiencing some shortness of breath and they obtained an EKG that said that there were some abnormalities and she decided to come into the emergency department.  No cough, fever, runny nose or sore throat, or chest pain.  No surgeries in the past month.  Not on blood thinners.  No history of DVT or PE.  No hormone use.  Did travel back from the [place] on 11/15/2022.  Says that since January has been having occasional palpitations and orthostasis.       Home Medications Prior to Admission medications   Medication Sig Start Date End Date Taking? Authorizing Provider  busPIRone (BUSPAR) 10 MG tablet Take 1 tablet by mouth in the morning and at bedtime.    [provider]  clobetasol ointment (TEMOVATE) 0.05 % daily. 05/17/22   [provider]  escitalopram (LEXAPRO) 20 MG tablet Take 20 mg by mouth daily. 07/12/21   [provider]  ferrous sulfate 325 (65 FE) MG tablet Take 325 mg by mouth daily with breakfast.    [provider]  flecainide (TAMBOCOR) 100 MG tablet Take 0.5 tablets (50 mg total) by mouth 2 (two) times daily. 08/06/22   Sherie Don, NP  metoprolol tartrate (LOPRESSOR) 25 MG tablet Take 0.5 tablets (12.5 mg total) by mouth 2 (two) times daily.  08/06/22   Sherie Don, NP  tizanidine (ZANAFLEX) 2 MG capsule Take 1 capsule (2 mg total) by mouth 3 (three) times daily. Patient taking differently: Take 2 mg by mouth as needed. 05/03/22   Carlisle Beers, FNP      Allergies    Nitrofurantoin, Prochlorperazine, Hydrocodone, and Latex    Review of Systems   Review of Systems  Physical Exam Updated Vital Signs BP 109/86 (BP Location: Right Arm)   Pulse 80   Temp 98.5 F (36.9 C) (Oral)   Resp 16   Ht 5\' 6"  (1.676 m)   Wt 63.5 kg   SpO2 98%   BMI 22.60 kg/m  Physical Exam Vitals and nursing note reviewed.  Constitutional:      General: She is not in acute distress.    Appearance: She is well-developed.  HENT:     Head: Normocephalic and atraumatic.     Right Ear: External ear normal.     Left Ear: External ear normal.     Nose: Nose normal.  Eyes:     Extraocular Movements: Extraocular movements intact.     Conjunctiva/sclera: Conjunctivae normal.     Pupils: Pupils are equal, round, and reactive to light.  Cardiovascular:     Rate and Rhythm: Normal rate and regular rhythm.     Heart sounds: No murmur heard. Pulmonary:     Effort: Pulmonary effort is normal. No respiratory distress.     Breath sounds: Normal breath sounds.  Musculoskeletal:     Cervical back: Normal range of motion and neck supple.     Right lower leg: No edema.     Left lower leg: No edema.  Skin:    General: Skin is warm and dry.  Neurological:     Mental Status: She is alert and oriented to person, place, and time. Mental status is at baseline.  Psychiatric:        Mood and Affect: Mood normal.     ED Results / Procedures / Treatments   Labs (all labs ordered are listed, but only abnormal results are displayed) Labs Reviewed  BASIC METABOLIC PANEL - Abnormal; Notable for the following components:      Result Value   Glucose, Bld 105 (*)    BUN 22 (*)    Calcium 8.6 (*)    All other components within normal limits  CBC -  Abnormal; Notable for the following components:   RBC 3.57 (*)    Hemoglobin 11.2 (*)    MCV 102.2 (*)    All other components within normal limits  D-DIMER, QUANTITATIVE  TROPONIN I (HIGH SENSITIVITY)    EKG None  Radiology No results found.  Procedures Procedures  {Document cardiac monitor, telemetry assessment procedure when appropriate:1}  Medications Ordered in ED Medications - No data to display  ED Course/ Medical Decision Making/ A&P   {   Click here for ABCD2, HEART and other calculatorsREFRESH Note before signing :1}                          Medical Decision Making Amount and/or Complexity of Data Reviewed Labs: ordered. Radiology: ordered.   ***  {Document critical care time when appropriate:1} {Document review of labs and clinical decision tools ie heart score, Chads2Vasc2 etc:1}  {Document your independent review of radiology images, and any outside records:1} {Document your discussion with family members, caretakers, and with consultants:1} {Document social determinants of health affecting pt's care:1} {Document your decision making why or why not admission, treatments were needed:1} Final Clinical Impression(s) / ED Diagnoses Final diagnoses:  None    Rx / DC Orders ED Discharge Orders     None

## 2022-11-21 NOTE — Discharge Instructions (Signed)
You were seen for your shortness of breath in the emergency department.  There were no signs of a heart attack or blood clot in your lungs.  Check your MyChart online for the results of any tests that had not resulted by the time you left the emergency department.   Follow-up with your primary doctor in 2-3 days regarding your visit.  Follow-up with your cardiologist as soon as possible as well.  Return immediately to the emergency department if you experience any of the following: Worsening shortness of breath, severe palpitations, or any other concerning symptoms.    Thank you for visiting our Emergency Department. It was a pleasure taking care of you today.

## 2022-11-21 NOTE — ED Triage Notes (Signed)
Pt sent by PCP for abnormal EKG. Pt states she was having heart palpitations started today and dizziness upon standing.Pt c/o SOB with the palpitations.

## 2022-12-09 NOTE — Progress Notes (Signed)
  Electrophysiology Office Note:   Date:  12/10/2022  ID:  Hifza Schwark, DOB [birth year], MRN [id number]  Primary Cardiologist: Verne Carrow, MD Electrophysiologist: Regan Lemming, MD      History of Present Illness:   Lisa Mosley is a 47 y.o. female with h/o PVCs and HTN seen today for post hospital follow up.    Seen in ED 11/03/2022 for leg pain, nausea, and fatigue. HR noted to be in 48 at rest on tele, up into 60-70s with exertion.  Seen in ED 11/21/22 with RLE pain and SOB. Work up was unremarkable.     Since discharge from hospital the patient reports doing well. Still has some fatigue, still having intermittent leg pain.  she denies chest pain, palpitations, dyspnea, PND, orthopnea, nausea, vomiting, dizziness, syncope, edema, weight gain, or early satiety.   Review of systems complete and found to be negative unless listed in HPI.   EP Information / Studies Reviewed:    EKG is ordered today. Personal review shows NSR at 66 bpm with stable intervals     Physical Exam:   VS:  BP 108/62   Pulse 66   Ht 5\' 6"  (1.676 m)   Wt 141 lb 9.6 oz (64.2 kg)   SpO2 98%   BMI 22.85 kg/m    Wt Readings from Last 3 Encounters:  12/10/22 141 lb 9.6 oz (64.2 kg)  11/21/22 139 lb 15.9 oz (63.5 kg)  11/02/22 140 lb (63.5 kg)     GEN: Well nourished, well developed in no acute distress NECK: No JVD; No carotid bruits CARDIAC: Regular rate and rhythm, no murmurs, rubs, gallops RESPIRATORY:  Clear to auscultation without rales, wheezing or rhonchi  ABDOMEN: Soft, non-tender, non-distended EXTREMITIES:  No edema; No deformity   ASSESSMENT AND PLAN:    PVCs No symptomatic burden currently Had reports of bradycardia in ED.  Continue flecainide 50 mg BID Change lopressor to Toprol 25 mg at bedtime to see if she has less fatigue.  Since we are treating PVCs, we are OK to stop the BB if needed  HTN Stable on current regimen    Follow up with Dr. Elberta Fortis in 6  months  Signed, Graciella Freer, PA-C

## 2022-12-10 ENCOUNTER — Encounter: Payer: Self-pay | Admitting: Student

## 2022-12-10 ENCOUNTER — Ambulatory Visit: Payer: BC Managed Care – PPO | Attending: Student | Admitting: Student

## 2022-12-10 VITALS — BP 108/62 | HR 66 | Ht 66.0 in | Wt 141.6 lb

## 2022-12-10 DIAGNOSIS — I493 Ventricular premature depolarization: Secondary | ICD-10-CM | POA: Diagnosis not present

## 2022-12-10 DIAGNOSIS — I10 Essential (primary) hypertension: Secondary | ICD-10-CM | POA: Diagnosis not present

## 2022-12-10 MED ORDER — METOPROLOL SUCCINATE ER 25 MG PO TB24: 25.0000 mg | ORAL_TABLET | Freq: Every day | ORAL | 3 refills | Status: DC

## 2022-12-10 NOTE — Patient Instructions (Signed)
Medication Instructions:  1.Stop metoprolol tartrate (Lopressor) 2.Start metoprolol succinate (Toprol XL) 25 mg at bedtime *If you need a refill on your cardiac medications before your next appointment, please call your pharmacy*  Lab Work: None ordered If you have labs (blood work) drawn today and your tests are completely normal, you will receive your results only by: MyChart Message (if you have MyChart) OR A paper copy in the mail If you have any lab test that is abnormal or we need to change your treatment, we will call you to review the results.  Follow-Up: At [facility], you and your health needs are our priority.  As part of our continuing mission to provide you with exceptional heart care, we have created designated Provider Care Teams.  These Care Teams include your primary Cardiologist (physician) and Advanced Practice Providers (APPs -  Physician Assistants and Nurse Practitioners) who all work together to provide you with the care you need, when you need it.  Your next appointment:   6 month(s)  Provider:   Loman Brooklyn, MD

## 2023-01-06 ENCOUNTER — Other Ambulatory Visit: Payer: Self-pay | Admitting: Family Medicine

## 2023-01-06 DIAGNOSIS — R42 Dizziness and giddiness: Secondary | ICD-10-CM

## 2023-01-11 ENCOUNTER — Encounter: Payer: Self-pay | Admitting: Neurology

## 2023-01-11 ENCOUNTER — Ambulatory Visit: Payer: BC Managed Care – PPO | Admitting: Neurology

## 2023-01-11 VITALS — BP 117/80 | HR 66 | Ht 66.0 in | Wt 142.0 lb

## 2023-01-11 DIAGNOSIS — R2681 Unsteadiness on feet: Secondary | ICD-10-CM | POA: Diagnosis not present

## 2023-01-11 NOTE — Patient Instructions (Signed)
Please let me know when your MRI results are finalized   Start physical therapy for balance training

## 2023-01-11 NOTE — Progress Notes (Signed)
[facility] Neurology Division Clinic Note - Initial Visit   Date: 01/11/2023   Merit Almeda MRN: [id number] DOB: April 15, 1976   Dear Dr. Judithann Sheen:  Thank you for your kind referral of Birdie Clune for consultation of imbalance. Although her history is well known to you, please allow [place] to reiterate it for the purpose of our medical record. The patient was accompanied to the clinic by self.   Arieal Lemin is a 47 y.o. right-handed female with anxiety/depression, s/p bariatric surgery with 115lb (2023) presenting for evaluation of lightheadedness.   IMPRESSION/PLAN: Gait unsteadiness, unclear etiology.  No evidence of neuropathy or sensory ataxia. Orthostatic vital signs are normal.   - MRI brain has been scheduled for 9/12.  Once these results are finalized, further recommendations can be provided  - In the meantime, I will refer her to PT for balance training  ------------------------------------------------------------- History of present illness: Starting in January 2024, she began having unsteadiness which is worse when she stands up.  It occurs about 3-4 times per week, which lasts less than a minute.  She has fallen 3 times since July, which occurred while walking.  She fell yesterday because her left foot was "not moving". She denies loss of awareness, numbness/tingling in the legs, or weakness.  She is a Environmental health practitioner.    Out-side paper records, electronic medical record, and images have been reviewed where available and summarized as:  Lab Results  Component Value Date   TSH 3.580 06/15/2017    Past Medical History:  Diagnosis Date   HTN (hypertension)    Migraines    Nephrolithiasis    PVC (premature ventricular contraction)    Stomach tumor (benign)     Past Surgical History:  Procedure Laterality Date   ABDOMINAL HYSTERECTOMY     APPENDECTOMY     CHOLECYSTECTOMY     HERNIA REPAIR     LAPAROSCOPIC GASTRIC RESTRICTIVE DUODENAL  PROCEDURE (DUODENAL SWITCH)     TUBAL LIGATION     Now reversed     Medications:  Outpatient Encounter Medications as of 01/11/2023  Medication Sig   busPIRone (BUSPAR) 10 MG tablet Take 1 tablet by mouth in the morning and at bedtime.   clobetasol ointment (TEMOVATE) 0.05 % daily.   escitalopram (LEXAPRO) 20 MG tablet Take 20 mg by mouth daily.   ferrous sulfate 325 (65 FE) MG tablet Take 325 mg by mouth daily with breakfast.   flecainide (TAMBOCOR) 100 MG tablet Take 0.5 tablets (50 mg total) by mouth 2 (two) times daily.   metoprolol succinate (TOPROL XL) 25 MG 24 hr tablet Take 1 tablet (25 mg total) by mouth at bedtime.   No facility-administered encounter medications on file as of 01/11/2023.    Allergies:  Allergies  Allergen Reactions   Nitrofurantoin Rash   Prochlorperazine     Other reaction(s): Other Bugs crawling Other reaction(s): Other   Hydrocodone Itching   Latex Rash    Family History: Family History  Problem Relation Age of Onset   Heart attack Father        39s    Social History: Social History   Tobacco Use   Smoking status: Former    Current packs/day: 0.50    Average packs/day: 0.5 packs/day for 10.0 years (5.0 ttl pk-yrs)    Types: Cigarettes   Smokeless tobacco: Never  Vaping Use   Vaping status: Never Used  Substance Use Topics   Alcohol use: Yes    Comment: Rare   Drug  use: No   Social History   Social History Narrative   Are you right handed or left handed? Right handed   Are you currently employed ? 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 No    Who lives with you? Husband and daughter    What type of home do you live in: 1 story or 2 story? Lives in a two story home        Vital Signs:  BP 117/80   Pulse 66   Ht 5\' 6"  (1.676 m)   Wt 142 lb (64.4 kg)   SpO2 98%   BMI 22.92 kg/m    Neurological Exam: MENTAL STATUS including orientation to time, place, person, recent and remote  memory, attention span and concentration, language, and fund of knowledge is normal.  Speech is not dysarthric.  CRANIAL NERVES: II:  No visual field defects.     III-IV-VI: Pupils equal round and reactive to light.  Normal conjugate, extra-ocular eye movements in all directions of gaze.  No nystagmus.  No ptosis.   V:  Normal facial sensation.    VII:  Normal facial symmetry and movements.   VIII:  Normal hearing and vestibular function.   IX-X:  Normal palatal movement.   XI:  Normal shoulder shrug and head rotation.   XII:  Normal tongue strength and range of motion, no deviation or fasciculation.  MOTOR:  No atrophy, fasciculations or abnormal movements.  No pronator drift.   Upper Extremity:  Right  Left  Deltoid  5/5   5/5   Biceps  5/5   5/5   Triceps  5/5   5/5   Wrist extensors  5/5   5/5   Wrist flexors  5/5   5/5   Finger extensors  5/5   5/5   Finger flexors  5/5   5/5   Dorsal interossei  5/5   5/5   Abductor pollicis  5/5   5/5   Tone (Ashworth scale)  0  0   Lower Extremity:  Right  Left  Hip flexors  5/5   5/5   Hip extensors  5/5   5/5   Knee flexors  5/5   5/5   Knee extensors  5/5   5/5   Dorsiflexors  5/5   5/5   Plantarflexors  5/5   5/5   Toe extensors  5/5   5/5   Toe flexors  5/5   5/5   Tone (Ashworth scale)  0  0   MSRs:                                           Right        Left brachioradialis 2+  2+  biceps 2+  2+  triceps 2+  2+  patellar 2+  2+  ankle jerk 2+  2+  Hoffman no  no  plantar response down  down   SENSORY:  Normal and symmetric perception of light touch, temperature and vibration.  Romberg's sign absent.   COORDINATION/GAIT: Normal finger-to- nose-finger.  Intact rapid alternating movements bilaterally.  Able to rise from a chair without using arms.  Gait narrow based and stable. Stressed gait intact.  Nonphysiologic pattern with tandem gait testing.   Thank you for allowing me to participate in patient's care.  If I can  answer any additional questions, I would be pleased to do so.    Sincerely,    Tykiera Raven K. Allena Katz, DO

## 2023-01-13 ENCOUNTER — Ambulatory Visit
Admission: RE | Admit: 2023-01-13 | Discharge: 2023-01-13 | Disposition: A | Discharge status: Home / Self Care | Payer: BC Managed Care – PPO | Source: Ambulatory Visit | Attending: Family Medicine | Admitting: Family Medicine

## 2023-01-13 DIAGNOSIS — R42 Dizziness and giddiness: Secondary | ICD-10-CM

## 2023-01-17 ENCOUNTER — Ambulatory Visit: Payer: BC Managed Care – PPO | Admitting: Cardiology

## 2023-01-29 ENCOUNTER — Ambulatory Visit (HOSPITAL_BASED_OUTPATIENT_CLINIC_OR_DEPARTMENT_OTHER)
Admission: RE | Admit: 2023-01-29 | Discharge: 2023-01-29 | Disposition: A | Discharge status: Home / Self Care | Payer: BC Managed Care – PPO | Source: Ambulatory Visit | Attending: Obstetrics and Gynecology | Admitting: Obstetrics and Gynecology

## 2023-01-29 ENCOUNTER — Other Ambulatory Visit (HOSPITAL_BASED_OUTPATIENT_CLINIC_OR_DEPARTMENT_OTHER): Payer: Self-pay

## 2023-01-29 DIAGNOSIS — Z1231 Encounter for screening mammogram for malignant neoplasm of breast: Secondary | ICD-10-CM

## 2023-02-02 ENCOUNTER — Encounter: Payer: Self-pay | Admitting: Neurology

## 2023-02-02 ENCOUNTER — Other Ambulatory Visit (HOSPITAL_BASED_OUTPATIENT_CLINIC_OR_DEPARTMENT_OTHER): Payer: Self-pay

## 2023-02-02 DIAGNOSIS — Z1231 Encounter for screening mammogram for malignant neoplasm of breast: Secondary | ICD-10-CM

## 2023-02-04 ENCOUNTER — Ambulatory Visit: Payer: BC Managed Care – PPO | Admitting: Physical Therapy

## 2023-02-09 ENCOUNTER — Telehealth: Payer: Self-pay

## 2023-02-09 ENCOUNTER — Ambulatory Visit: Payer: BC Managed Care – PPO | Admitting: Podiatry

## 2023-02-09 ENCOUNTER — Ambulatory Visit (INDEPENDENT_AMBULATORY_CARE_PROVIDER_SITE_OTHER): Payer: BC Managed Care – PPO

## 2023-02-09 DIAGNOSIS — S82892A Other fracture of left lower leg, initial encounter for closed fracture: Secondary | ICD-10-CM | POA: Diagnosis not present

## 2023-02-09 DIAGNOSIS — E569 Vitamin deficiency, unspecified: Secondary | ICD-10-CM

## 2023-02-09 NOTE — Telephone Encounter (Signed)
Crystal at [facility] medical supply called, requesting today's office note.Faxed to [phone]. Confirmation received. Phone [phone]

## 2023-02-09 NOTE — Progress Notes (Signed)
  Subjective:  Patient ID: Lisa Mosley, female    DOB: [birth year],  MRN: [id number]  Chief Complaint  Patient presents with   Ankle Pain    Left ankle fracture    Discussed the use of AI scribe software for clinical note transcription with the patient, who gave verbal consent to proceed.  History of Present Illness   The patient, a high school teacher, presents with a left ankle injury sustained at work on October 1st. She was standing from her desk to adjust the sound on the board when her foot rolled in, causing her to fall to the ground. She heard a pop at the time of the injury. Initial x-rays were done at [facility], but the patient did not receive a copy of these images. She has been non-weight bearing on the left and using a cam boot for mobilization. She also has a short walking boot at home. The patient has a history of bariatric surgery.          Objective:    Physical Exam   EXTREMITIES: Nonweight bearing on the left. Pain on palpation to the deltoid complex and lateral fibula. No pain on anterior ankle syndesmosis, fifth metatarsal, or navicular. Pulses palpable. Plus two DP and PT. Good capillary fill time.       No images are attached to the encounter.    Results   RADIOLOGY Left ankle x-ray: Nondisplaced distal fibular fracture. Small partial avulsion on the medial malleolus.      Assessment:   1. Closed fracture of left ankle, initial encounter   2. Vitamin deficiency      Plan:  Patient was evaluated and treated and all questions answered.  Assessment and Plan    Left Ankle Fracture   She has a nondisplaced distal fibular fracture with a partial small avulsion on the medial portion of the medial malleolus and experiences pain on palpation to the deltoid complex and lateral fibula, but no pain on the anterior ankle syndesmosis, fifth metatarsal, or navicular. We discussed the possibility of needing surgical reconstruction if  displacement occurs with weight bearing I am hopeful this will not be the case. We will continue with the cam boot for immobilization and gradually trial weight bearing in one week; if still painful, she will remain non-weight bearing and trial in one more week. We plan to re-xray in three weeks after some weight bearing and check vitamin D and calcium levels due to her history of bariatric surgery and potential risk of vitamin deficiency. We will also order a knee scooter for her to utilize for work.          Return in about 3 weeks (around 03/02/2023) for follow up on L ankle fracture new xrays weightbearing if possible.

## 2023-02-09 NOTE — Patient Instructions (Signed)
VISIT SUMMARY:  During your visit, we discussed your left ankle injury that occurred at work. You've been using a cam boot for mobility and have been avoiding putting weight on your left foot. We noted that you have a fracture in your lower left fibula (the smaller bone in your lower leg) that hasn't moved out of place, and a small bulge on the inside of your ankle. You also have a history of bariatric surgery, which can sometimes affect bone health.  YOUR PLAN:  -LEFT ANKLE FRACTURE: We will continue with the cam boot for mobility and will try to gradually put weight on your foot in a week. If it's still painful, we'll wait another week before trying again. We'll take new x-rays in three weeks after you've been able to put some weight on your foot. We'll also check your vitamin D and calcium levels, as these are important for bone health and can sometimes be low after bariatric surgery. We're also ordering a knee scooter for you to use at work.  INSTRUCTIONS:  Continue to use your cam boot and avoid putting weight on your left foot for now. We'll try to gradually put weight on your foot in a week. If it's still painful, we'll wait another week before trying again. We'll take new x-rays in three weeks after you've been able to put some weight on your foot. We're also ordering a knee scooter for you to use at work. We'll also be checking your vitamin D and calcium levels, so you may need to come in for a blood draw.

## 2023-02-15 LAB — VITAMIN D 25 HYDROXY (VIT D DEFICIENCY, FRACTURES): Vit D, 25-Hydroxy: 87.9 ng/mL (ref 30.0–100.0)

## 2023-02-15 LAB — CALCIUM: Calcium: 9.8 mg/dL (ref 8.7–10.2)

## 2023-03-01 ENCOUNTER — Ambulatory Visit: Payer: BC Managed Care – PPO

## 2023-03-01 ENCOUNTER — Ambulatory Visit: Payer: BC Managed Care – PPO | Admitting: Podiatry

## 2023-03-01 ENCOUNTER — Ambulatory Visit (INDEPENDENT_AMBULATORY_CARE_PROVIDER_SITE_OTHER): Payer: BC Managed Care – PPO

## 2023-03-01 VITALS — Ht 66.0 in | Wt 140.0 lb

## 2023-03-01 DIAGNOSIS — M778 Other enthesopathies, not elsewhere classified: Secondary | ICD-10-CM | POA: Diagnosis not present

## 2023-03-01 DIAGNOSIS — S82892D Other fracture of left lower leg, subsequent encounter for closed fracture with routine healing: Secondary | ICD-10-CM

## 2023-03-01 DIAGNOSIS — M25372 Other instability, left ankle: Secondary | ICD-10-CM

## 2023-03-01 NOTE — Patient Instructions (Signed)
Call to schedule physical therapy: [facility] at [place] [address]  [phone]  

## 2023-03-01 NOTE — Progress Notes (Signed)
  Subjective:  Patient ID: Lisa Mosley, female    DOB: [birth year],  MRN: [id number]  Chief Complaint  Patient presents with   Ankle Injury    Follow up. Still having some pain, 6/10 at worse, 4/10 normally. Ice and rest seem to help.       Discussed the use of AI scribe software for clinical note transcription with the patient, who gave verbal consent to proceed.  History of Present Illness   She returns for follow-up she is having some improvement but still painful feels unstable has been wearing the boot, was not able to use a knee scooter due to balance issues         Objective:    Physical Exam   EXTREMITIES: Still has paincentered on the distal fibula. No pain on anterior ankle syndesmosis, fifth metatarsal, or navicular. Pulses palpable. Plus two DP and PT. Good capillary fill time.       No images are attached to the encounter.    Results   RADIOLOGY Left ankle x-ray: New radiographs taken today show maintained ankle mortise alignment no change or increase in the tib-fib overlap or medial clear space  Assessment:   Encounter Diagnoses  Name Primary?   Closed fracture of left ankle with routine healing, subsequent encounter Yes   Ankle instability, left       Plan:  Patient was evaluated and treated and all questions answered.  Assessment and Plan    Left Ankle Fracture   Ankle remains stable no 4 weeks from her injury.  She should continue to be WBAT in the cam boot gradually we discussed limiting weightbearing as well as if time off work will be necessary but she feels she cannot do this at this point.  She will let me know if this changes.  Referral placed for physical therapy to start around the next visit at the seven 8-week mark for range of motion and strengthening I expect she likely will need some long-term ankle stabilization.  I will see her back in 4 weeks new radiographs.  Her vitamin D and calcium level is within normal limits   Return in  about 4 weeks (around 03/29/2023) for ankle fracture followup (new xrays).

## 2023-03-03 ENCOUNTER — Ambulatory Visit: Payer: BC Managed Care – PPO | Admitting: Podiatry

## 2023-03-04 ENCOUNTER — Other Ambulatory Visit: Payer: Self-pay

## 2023-03-04 ENCOUNTER — Ambulatory Visit: Payer: BC Managed Care – PPO | Attending: Podiatry

## 2023-03-04 DIAGNOSIS — S82892D Other fracture of left lower leg, subsequent encounter for closed fracture with routine healing: Secondary | ICD-10-CM | POA: Diagnosis not present

## 2023-03-04 DIAGNOSIS — R2689 Other abnormalities of gait and mobility: Secondary | ICD-10-CM | POA: Diagnosis present

## 2023-03-04 DIAGNOSIS — M6281 Muscle weakness (generalized): Secondary | ICD-10-CM

## 2023-03-04 DIAGNOSIS — M25572 Pain in left ankle and joints of left foot: Secondary | ICD-10-CM | POA: Diagnosis present

## 2023-03-04 DIAGNOSIS — M25372 Other instability, left ankle: Secondary | ICD-10-CM | POA: Insufficient documentation

## 2023-03-04 NOTE — Therapy (Signed)
OUTPATIENT PHYSICAL THERAPY LOWER EXTREMITY EVALUATION   Patient Name: Lisa Mosley MRN: [id number] DOB:1976/01/21, 47 y.o., female Today's Date: 03/04/2023  END OF SESSION:  PT End of Session - 03/04/23 1049     Visit Number 1    Number of Visits 17    Date for PT Re-Evaluation 04/29/23    Authorization Type BCBS    PT Start Time 1012    PT Stop Time 1047    PT Time Calculation (min) 35 min    Activity Tolerance Patient tolerated treatment well    Behavior During Therapy WFL for tasks assessed/performed             Past Medical History:  Diagnosis Date   HTN (hypertension)    Migraines    Nephrolithiasis    PVC (premature ventricular contraction)    Stomach tumor (benign)    Past Surgical History:  Procedure Laterality Date   ABDOMINAL HYSTERECTOMY     APPENDECTOMY     CHOLECYSTECTOMY     HERNIA REPAIR     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)     TUBAL LIGATION     Now reversed   Patient Active Problem List   Diagnosis Date Noted   PVC's (premature ventricular contractions) 07/13/2017   Fatigue 07/13/2017   Hypertension 07/13/2017   Severe dysplasia of cervix 04/17/2015    PCP: Irven Coe, MD  REFERRING PROVIDER: Edwin Cap, DPM  REFERRING DIAG:  [id number] (ICD-10-CM) - Closed fracture of left ankle with routine healing, subsequent encounter M25.372 (ICD-10-CM) - Ankle instability, left  THERAPY DIAG:  Pain in left ankle and joints of left foot  Muscle weakness (generalized)  Other abnormalities of gait and mobility  Rationale for Evaluation and Treatment: Rehabilitation  ONSET DATE: 02/01/2023  SUBJECTIVE:   SUBJECTIVE STATEMENT: Pt presents to PT s/p closed L distal fibular fracture with small avulsion of medial malleolus after fall on 02/01/2023. Is now in a CAM boot for L side and is WBAT, using one crutch to assist ambulation. Unfortunately has had a few falls while at home using crutches. Denies N/T  in L ankle post injury. Walking and standing greatly increase pain right now.   PERTINENT HISTORY: HTN, Bariatric surgery  PAIN:  Are you having pain?  Yes: NPRS scale: 4/10 Worst: 8/10 Pain location: lateral L ankle Pain description: sharp Aggravating factors: walking, prolonged standing Relieving factors: rest, ice  PRECAUTIONS: None  RED FLAGS: None   WEIGHT BEARING RESTRICTIONS: Yes WBAT in CAM boot  FALLS:  Has patient fallen in last 6 months? Yes. Number of falls - 4; first is the L ankle injury, 3 falls while using crutches  LIVING ENVIRONMENT: Lives with: lives with their family Lives in: House/apartment Stairs: Has stairs - but feels okay this  Has following equipment at home: Crutches and CAM boot  OCCUPATIONProduction manager - English  PLOF: Independent  PATIENT GOALS: decrease pain in L ankle, improve ankle stability in order to get back to being active  NEXT MD VISIT: 03/29/2023  OBJECTIVE:  Note: Objective measures were completed at Evaluation unless otherwise noted.  DIAGNOSTIC FINDINGS: See imaging  PATIENT SURVEYS:  FOTO: 47% function; 66% predicted  COGNITION: Overall cognitive status: Within functional limits for tasks assessed     SENSATION: WFL  EDEMA:  DNT  POSTURE: weight shift right  PALPATION: TTP to L fibularis longus/brevis  LOWER EXTREMITY ROM:  Active ROM Right eval Left eval  Hip flexion    Hip extension  Hip abduction    Hip adduction    Hip internal rotation    Hip external rotation    Knee flexion    Knee extension    Ankle dorsiflexion 10 0  Ankle plantarflexion    Ankle inversion    Ankle eversion     (Blank rows = not tested)  LOWER EXTREMITY MMT:  MMT Right eval Left eval  Hip flexion    Hip extension    Hip abduction    Hip adduction    Hip internal rotation    Hip external rotation    Knee flexion    Knee extension    Ankle dorsiflexion 5 2+  Ankle plantarflexion 4 3  Ankle  inversion 5 3  Ankle eversion 5 3   (Blank rows = not tested)  LOWER EXTREMITY SPECIAL TESTS:  DNT  FUNCTIONAL TESTS:  Five Time Sit to Stand: 20 seconds with UE  GAIT: Distance walked: 67ft Assistive device utilized: Crutches and L CAM boot Level of assistance: Modified independence Comments: decreased gait speed, R lean   TREATMENT: [facility] Adult PT Treatment:                                                DATE: 03/04/2023 Therapeutic Exercise: Ankle pumps x 20 Long sitting calf stretch with towel x 30" L Ankle inversion/eversion towel slide x 10 each L Seated heel raise x 15  PATIENT EDUCATION:  Education details: eval findings, FOTO, HEP, POC Person educated: Patient Education method: Explanation, Demonstration, and Handouts Education comprehension: verbalized understanding and returned demonstration  HOME EXERCISE PROGRAM: Access Code: [email or web address] URL: [email or web address] Date: 03/04/2023 Prepared by: Edwinna Areola  Exercises - Supine Ankle Pumps  - 2 x daily - 7 x weekly - 2 sets - 20 reps - Long Sitting Calf Stretch with Strap  - 2 x daily - 7 x weekly - 2-3 reps - 30 sec hold - Ankle Inversion Eversion Towel Slide  - 2 x daily - 7 x weekly - 2 sets - 10 reps - Seated Heel Raise  - 2 x daily - 7 x weekly - 3 sets - 15 reps  ASSESSMENT:  CLINICAL IMPRESSION: Patient is a 47 y.o. F who was seen today for physical therapy evaluation and treatment for subacute closed L distal fibular fracture with small avulsion of medial malleolus after fall on 02/01/2023. Physical findings are consistent with injury and recovery timeline as pt shows deficits in L ankle strength/ROM and gait. FOTO score shows decrease in subjective functional ability below PLOF. Pt would benefit from skilled PT services working on decreasing pain, normalizing ankle ROM, and improving gait/balance.   OBJECTIVE IMPAIRMENTS: Abnormal gait, decreased activity tolerance, decreased balance,  decreased mobility, difficulty walking, decreased ROM, decreased strength, and pain  ACTIVITY LIMITATIONS: standing, squatting, stairs, transfers, and locomotion level  PARTICIPATION LIMITATIONS: driving, shopping, community activity, occupation, and yard work  PERSONAL FACTORS: Time since onset of injury/illness/exacerbation and 1-2 comorbidities: HTN, Bariatric surgery  are also affecting patient's functional outcome.   REHAB POTENTIAL: Excellent  CLINICAL DECISION MAKING: Stable/uncomplicated  EVALUATION COMPLEXITY: Low   GOALS: Goals reviewed with patient? No  SHORT TERM GOALS: Target date: 03/25/2023   Pt will be compliant and knowledgeable with initial HEP for improved comfort and carryover Baseline: initial HEP given  Goal status: INITIAL  2.  Pt will self report  left ankle pain no greater than 5/10 for improved comfort and functional ability Baseline: 7/10 at worst Goal status: INITIAL   LONG TERM GOALS: Target date: 04/29/2023   Pt will improve FOTO function score to no less than 66% as proxy for functional improvement with home ADLs and community activities Baseline: 47% function Goal status: INITIAL   2.  Pt will self report left ankle pain no greater than 2/10 for improved comfort and functional ability Baseline: 7/10 at worst Goal status: INITIAL   3.  Pt will decrease Five Time Sit to Stand time to no less than 15 seconds for improved balance, strength, and functional mobility Baseline: 20 seconds with UE Goal status: INITIAL    4.  Pt will improve L ankle DF ROM to no less than 10 degrees for improved ankle mobility and gait Baseline: 0 degrees Goal status: INITIAL  5.  Pt will be able to ambulate approximately one mile with LRAD and no increase in ankle pain for improved comfort and functional mobility and get back to higher level activities Baseline: unable Goal status: INITIAL   PLAN:  PT FREQUENCY: 1-2x/week  PT DURATION: 8 weeks  PLANNED  INTERVENTIONS: 97164- PT Re-evaluation, 97110-Therapeutic exercises, 97530- Therapeutic activity, 97112- Neuromuscular re-education, 97535- Self Care, 60454- Manual therapy, [id number]- Gait training, [id number]- Aquatic Therapy, 97014- Electrical stimulation (unattended), [id number]- Electrical stimulation (manual), 97016- Vasopneumatic device, Dry Needling, Cryotherapy, and Moist heat  PLAN FOR NEXT SESSION: assess HEP response, ankle strength/ROM,    Eloy End, PT 03/04/2023, 10:52 AM

## 2023-03-08 ENCOUNTER — Ambulatory Visit: Payer: BC Managed Care – PPO | Admitting: Physical Therapy

## 2023-03-08 ENCOUNTER — Other Ambulatory Visit: Payer: Self-pay

## 2023-03-08 ENCOUNTER — Encounter: Payer: Self-pay | Admitting: Physical Therapy

## 2023-03-08 DIAGNOSIS — M6281 Muscle weakness (generalized): Secondary | ICD-10-CM

## 2023-03-08 DIAGNOSIS — M25572 Pain in left ankle and joints of left foot: Secondary | ICD-10-CM | POA: Diagnosis not present

## 2023-03-08 DIAGNOSIS — R2689 Other abnormalities of gait and mobility: Secondary | ICD-10-CM

## 2023-03-08 NOTE — Therapy (Signed)
OUTPATIENT PHYSICAL THERAPY TREATMENT   Patient Name: Lisa Mosley MRN: [id number] DOB:1975/05/31, 47 y.o., female Today's Date: 03/08/2023   END OF SESSION:  PT End of Session - 03/08/23 0803     Visit Number 2    Number of Visits 17    Date for PT Re-Evaluation 04/29/23    Authorization Type BCBS    PT Start Time 0803    PT Stop Time 0845    PT Time Calculation (min) 42 min    Activity Tolerance Patient tolerated treatment well    Behavior During Therapy WFL for tasks assessed/performed              Past Medical History:  Diagnosis Date   HTN (hypertension)    Migraines    Nephrolithiasis    PVC (premature ventricular contraction)    Stomach tumor (benign)    Past Surgical History:  Procedure Laterality Date   ABDOMINAL HYSTERECTOMY     APPENDECTOMY     CHOLECYSTECTOMY     HERNIA REPAIR     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)     TUBAL LIGATION     Now reversed   Patient Active Problem List   Diagnosis Date Noted   PVC's (premature ventricular contractions) 07/13/2017   Fatigue 07/13/2017   Hypertension 07/13/2017   Severe dysplasia of cervix 04/17/2015    PCP: Irven Coe, MD  REFERRING PROVIDER: Edwin Cap, DPM  REFERRING DIAG:  [phone] (ICD-10-CM) - Closed fracture of left ankle with routine healing, subsequent encounter M25.372 (ICD-10-CM) - Ankle instability, left  THERAPY DIAG:  Pain in left ankle and joints of left foot  Muscle weakness (generalized)  Other abnormalities of gait and mobility  Rationale for Evaluation and Treatment: Rehabilitation  ONSET DATE: 02/01/2023   SUBJECTIVE:  SUBJECTIVE STATEMENT: Patient reports she feels like the ankle is getting better, consistent with the exercises.   PERTINENT HISTORY: HTN, Bariatric surgery  PAIN:  Are you having pain?  Yes: NPRS scale: 2/10 Worst: 8/10 Pain location: lateral L ankle Pain description: Aching, pulling Aggravating  factors: walking, prolonged standing Relieving factors: rest, ice  PRECAUTIONS: None  WEIGHT BEARING RESTRICTIONS: Yes WBAT in CAM boot  FALLS:  Has patient fallen in last 6 months? Yes. Number of falls - 4; first is the L ankle injury, 3 falls while using crutches  PATIENT GOALS: decrease pain in L ankle, improve ankle stability in order to get back to being active  NEXT MD VISIT: 03/29/2023   OBJECTIVE:  Note: Objective measures were completed at Evaluation unless otherwise noted. PATIENT SURVEYS:  FOTO: 47% function; 66% predicted  EDEMA:  DNT  POSTURE: weight shift right  PALPATION: TTP to L fibularis longus/brevis  LOWER EXTREMITY ROM:  Active ROM Right eval Left eval Left 03/08/2023  Hip flexion     Hip extension     Hip abduction     Hip adduction     Hip internal rotation     Hip external rotation     Knee flexion     Knee extension     Ankle dorsiflexion 10 0 5  Ankle plantarflexion   45  Ankle inversion   30  Ankle eversion   5   (Blank rows = not tested)  LOWER EXTREMITY MMT:  MMT Right eval Left eval  Hip flexion    Hip extension    Hip abduction    Hip adduction    Hip internal rotation    Hip external rotation  Knee flexion    Knee extension    Ankle dorsiflexion 5 2+  Ankle plantarflexion 4 3  Ankle inversion 5 3  Ankle eversion 5 3   (Blank rows = not tested)  LOWER EXTREMITY SPECIAL TESTS:  DNT  FUNCTIONAL TESTS:  Five Time Sit to Stand: 20 seconds with UE  GAIT: Distance walked: 80ft Assistive device utilized: Crutches and L CAM boot Level of assistance: Modified independence Comments: decreased gait speed, R lean   TREATMENT: [facility] Adult PT Treatment:                                                DATE: 03/08/2023 Therapeutic Exercise: Longsitting calf stretch 3 x 30 sec Longsitting ankle PF and DF with red 3 x 15 Seated ankle inversion and eversion with towel 2 x 20 Seated towel scrunches 2 x 20 Seated heel toe  raises 2 x 20 Seated BAPS L3 fwd/bwd and lateral taps 2 x 10 each Longsitting ankle inversion and eversion with yellow 2 x 10   [facility] Adult PT Treatment:                                                DATE: 03/04/2023 Therapeutic Exercise: Ankle pumps x 20 Long sitting calf stretch with towel x 30" L Ankle inversion/eversion towel slide x 10 each L Seated heel raise x 15  PATIENT EDUCATION:  Education details: HEP update Person educated: Patient Education method: Explanation, Demonstration, and Handouts Education comprehension: verbalized understanding and returned demonstration  HOME EXERCISE PROGRAM: Access Code: [email or web address]   ASSESSMENT: CLINICAL IMPRESSION: Patient tolerated therapy well with no adverse effects. Therapy focused on progressing ankle motion and light strengthening with good tolerance. She does demonstrate improvement in her left ankle motion this visit. She does report more difficulty with ankle eversion against resistance but was able to tolerate light banded exercise. Updated HEP to progress ankle motion and strengthening for home. Patient would benefit from continued skilled PT to progress her ankle mobility and strength in order to reduce pain and maximize functional ability.   OBJECTIVE IMPAIRMENTS: Abnormal gait, decreased activity tolerance, decreased balance, decreased mobility, difficulty walking, decreased ROM, decreased strength, and pain  ACTIVITY LIMITATIONS: standing, squatting, stairs, transfers, and locomotion level  PARTICIPATION LIMITATIONS: driving, shopping, community activity, occupation, and yard work  PERSONAL FACTORS: Time since onset of injury/illness/exacerbation and 1-2 comorbidities: HTN, Bariatric surgery  are also affecting patient's functional outcome.    GOALS: Goals reviewed with patient? No  SHORT TERM GOALS: Target date: 03/25/2023   Pt will be compliant and knowledgeable with initial HEP for improved comfort and  carryover Baseline: initial HEP given  Goal status: INITIAL  2.  Pt will self report left ankle pain no greater than 5/10 for improved comfort and functional ability Baseline: 7/10 at worst Goal status: INITIAL   LONG TERM GOALS: Target date: 04/29/2023   Pt will improve FOTO function score to no less than 66% as proxy for functional improvement with home ADLs and community activities Baseline: 47% function Goal status: INITIAL   2.  Pt will self report left ankle pain no greater than 2/10 for improved comfort and functional ability Baseline: 7/10 at worst Goal status: INITIAL  3.  Pt will decrease Five Time Sit to Stand time to no less than 15 seconds for improved balance, strength, and functional mobility Baseline: 20 seconds with UE Goal status: INITIAL    4.  Pt will improve L ankle DF ROM to no less than 10 degrees for improved ankle mobility and gait Baseline: 0 degrees Goal status: INITIAL  5.  Pt will be able to ambulate approximately one mile with LRAD and no increase in ankle pain for improved comfort and functional mobility and get back to higher level activities Baseline: unable Goal status: INITIAL   PLAN: PT FREQUENCY: 1-2x/week  PT DURATION: 8 weeks  PLANNED INTERVENTIONS: 97164- PT Re-evaluation, 97110-Therapeutic exercises, 97530- Therapeutic activity, 97112- Neuromuscular re-education, 97535- Self Care, 16109- Manual therapy, [id number]- Gait training, [id number]- Aquatic Therapy, 97014- Electrical stimulation (unattended), [id number]- Electrical stimulation (manual), 97016- Vasopneumatic device, Dry Needling, Cryotherapy, and Moist heat  PLAN FOR NEXT SESSION: assess HEP response, ankle strength/ROM,    Rosana Hoes, PT, DPT, LAT, ATC 03/08/23  8:51 AM Phone: [phone] Fax: [phone]

## 2023-03-08 NOTE — Patient Instructions (Signed)
Access Code: [email or web address] URL: [email or web address] Date: 03/08/2023 Prepared by: Rosana Hoes  Exercises - Supine Ankle Pumps  - 2 x daily - 7 x weekly - 2 sets - 20 reps - Long Sitting Calf Stretch with Strap  - 2 x daily - 7 x weekly - 2-3 reps - 30 sec hold - Ankle Inversion Eversion Towel Slide  - 2 x daily - 7 x weekly - 2 sets - 10 reps - Seated Heel Toe Raises  - 1 x daily - 3 sets - 20 reps - Long Sitting Ankle Plantar Flexion with Resistance  - 1 x daily - 3 sets - 15 reps - Long Sitting Ankle Eversion with Resistance  - 1 x daily - 3 sets - 10 reps

## 2023-03-16 ENCOUNTER — Encounter: Payer: Self-pay | Admitting: Physical Therapy

## 2023-03-16 ENCOUNTER — Ambulatory Visit: Payer: BC Managed Care – PPO | Admitting: Physical Therapy

## 2023-03-16 DIAGNOSIS — M6281 Muscle weakness (generalized): Secondary | ICD-10-CM

## 2023-03-16 DIAGNOSIS — R2689 Other abnormalities of gait and mobility: Secondary | ICD-10-CM

## 2023-03-16 DIAGNOSIS — M25572 Pain in left ankle and joints of left foot: Secondary | ICD-10-CM | POA: Diagnosis not present

## 2023-03-16 NOTE — Therapy (Signed)
OUTPATIENT PHYSICAL THERAPY TREATMENT   Patient Name: Lisa Mosley MRN: [id number] DOB:1975/07/18, 47 y.o., female Today's Date: 03/16/2023   END OF SESSION:  PT End of Session - 03/16/23 0720     Visit Number 3    Number of Visits 17    Date for PT Re-Evaluation 04/29/23    Authorization Type BCBS    PT Start Time 0718    PT Stop Time 0750    PT Time Calculation (min) 32 min              Past Medical History:  Diagnosis Date   HTN (hypertension)    Migraines    Nephrolithiasis    PVC (premature ventricular contraction)    Stomach tumor (benign)    Past Surgical History:  Procedure Laterality Date   ABDOMINAL HYSTERECTOMY     APPENDECTOMY     CHOLECYSTECTOMY     HERNIA REPAIR     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)     TUBAL LIGATION     Now reversed   Patient Active Problem List   Diagnosis Date Noted   PVC's (premature ventricular contractions) 07/13/2017   Fatigue 07/13/2017   Hypertension 07/13/2017   Severe dysplasia of cervix 04/17/2015    PCP: Irven Coe, MD  REFERRING PROVIDER: Edwin Cap, DPM  REFERRING DIAG:  [phone] (ICD-10-CM) - Closed fracture of left ankle with routine healing, subsequent encounter M25.372 (ICD-10-CM) - Ankle instability, left  THERAPY DIAG:  Pain in left ankle and joints of left foot  Other abnormalities of gait and mobility  Muscle weakness (generalized)  Rationale for Evaluation and Treatment: Rehabilitation  ONSET DATE: 02/01/2023   SUBJECTIVE:  SUBJECTIVE STATEMENT: Patient reports she feels like the ankle is getting better, consistent with the exercises.   PERTINENT HISTORY: HTN, Bariatric surgery  PAIN:  Are you having pain?  Yes: NPRS scale: 2/10 Worst: 8/10 Pain location: lateral L ankle Pain description: Aching, pulling Aggravating factors: walking, prolonged standing Relieving factors: rest, ice  PRECAUTIONS: None  WEIGHT BEARING RESTRICTIONS:  Yes WBAT in CAM boot  FALLS:  Has patient fallen in last 6 months? Yes. Number of falls - 4; first is the L ankle injury, 3 falls while using crutches  PATIENT GOALS: decrease pain in L ankle, improve ankle stability in order to get back to being active  NEXT MD VISIT: 03/29/2023   OBJECTIVE:  Note: Objective measures were completed at Evaluation unless otherwise noted. PATIENT SURVEYS:  FOTO: 47% function; 66% predicted  EDEMA:  DNT  POSTURE: weight shift right  PALPATION: TTP to L fibularis longus/brevis  LOWER EXTREMITY ROM:  Active ROM Right eval Left eval Left 03/08/2023  Hip flexion     Hip extension     Hip abduction     Hip adduction     Hip internal rotation     Hip external rotation     Knee flexion     Knee extension     Ankle dorsiflexion 10 0 5  Ankle plantarflexion   45  Ankle inversion   30  Ankle eversion   5   (Blank rows = not tested)  LOWER EXTREMITY MMT:  MMT Right eval Left eval  Hip flexion    Hip extension    Hip abduction    Hip adduction    Hip internal rotation    Hip external rotation    Knee flexion    Knee extension    Ankle dorsiflexion 5 2+  Ankle plantarflexion  4 3  Ankle inversion 5 3  Ankle eversion 5 3   (Blank rows = not tested)  LOWER EXTREMITY SPECIAL TESTS:  DNT  FUNCTIONAL TESTS:  Five Time Sit to Stand: 20 seconds with UE  GAIT: Distance walked: 78ft Assistive device utilized: Crutches and L CAM boot Level of assistance: Modified independence Comments: decreased gait speed, R lean   TREATMENT: [facility] Adult PT Treatment:                                                DATE: 03/16/23 Therapeutic Exercise: Ankle circles AROM CW/CCW x 10 each  Seated heel raises 20 x 2  Seated toe raises 20 x 2  DF Stretch with towel long sitting 30 sec x 2  Seated BAPS L3 fwd/bwd,  lateral taps, circles  1 x 10 each Longsitting ankle inversion and eversion with yellow 2 x 10 Longsitting ankle DF yellow 2 x 10,  instructed in seated version  Mat SLR 3 way x 10 each bilateral. Decreased endurance left LE    [facility] Adult PT Treatment:                                                DATE: 03/08/2023 Therapeutic Exercise: Longsitting calf stretch 3 x 30 sec Longsitting ankle PF and DF with red 3 x 15 Seated ankle inversion and eversion with towel 2 x 20 Seated towel scrunches 2 x 20 Seated heel toe raises 2 x 20 Seated BAPS L3 fwd/bwd and lateral taps 2 x 10 each Longsitting ankle inversion and eversion with yellow 2 x 10   [facility] Adult PT Treatment:                                                DATE: 03/04/2023 Therapeutic Exercise: Ankle pumps x 20 Long sitting calf stretch with towel x 30" L Ankle inversion/eversion towel slide x 10 each L Seated heel raise x 15  PATIENT EDUCATION:  Education details: HEP update Person educated: Patient Education method: Explanation, Demonstration, and Handouts Education comprehension: verbalized understanding and returned demonstration  HOME EXERCISE PROGRAM: Access Code: [email or web address]   ASSESSMENT: CLINICAL IMPRESSION: Pt arrives with reports of stiffness, not pain. She does have some popping with ROM. Continued with ROM and open chain strengthening. Added open chain hip strengthening with pt noting decreased endurance with LLE.   Updated HEP to progress ankle strengthening for home. Patient would benefit from continued skilled PT to progress her ankle mobility and strength in order to reduce pain and maximize functional ability.   OBJECTIVE IMPAIRMENTS: Abnormal gait, decreased activity tolerance, decreased balance, decreased mobility, difficulty walking, decreased ROM, decreased strength, and pain  ACTIVITY LIMITATIONS: standing, squatting, stairs, transfers, and locomotion level  PARTICIPATION LIMITATIONS: driving, shopping, community activity, occupation, and yard work  PERSONAL FACTORS: Time since onset of injury/illness/exacerbation and 1-2  comorbidities: HTN, Bariatric surgery  are also affecting patient's functional outcome.    GOALS: Goals reviewed with patient? No  SHORT TERM GOALS: Target date: 03/25/2023   Pt will be compliant and knowledgeable with initial HEP for improved  comfort and carryover Baseline: initial HEP given  Goal status: INITIAL  2.  Pt will self report left ankle pain no greater than 5/10 for improved comfort and functional ability Baseline: 7/10 at worst Goal status: INITIAL   LONG TERM GOALS: Target date: 04/29/2023   Pt will improve FOTO function score to no less than 66% as proxy for functional improvement with home ADLs and community activities Baseline: 47% function Goal status: INITIAL   2.  Pt will self report left ankle pain no greater than 2/10 for improved comfort and functional ability Baseline: 7/10 at worst Goal status: INITIAL   3.  Pt will decrease Five Time Sit to Stand time to no less than 15 seconds for improved balance, strength, and functional mobility Baseline: 20 seconds with UE Goal status: INITIAL    4.  Pt will improve L ankle DF ROM to no less than 10 degrees for improved ankle mobility and gait Baseline: 0 degrees Goal status: INITIAL  5.  Pt will be able to ambulate approximately one mile with LRAD and no increase in ankle pain for improved comfort and functional mobility and get back to higher level activities Baseline: unable Goal status: INITIAL   PLAN: PT FREQUENCY: 1-2x/week  PT DURATION: 8 weeks  PLANNED INTERVENTIONS: 97164- PT Re-evaluation, 97110-Therapeutic exercises, 97530- Therapeutic activity, 97112- Neuromuscular re-education, 97535- Self Care, 78295- Manual therapy, [id number]- Gait training, [id number]- Aquatic Therapy, 97014- Electrical stimulation (unattended), [id number]- Electrical stimulation (manual), 97016- Vasopneumatic device, Dry Needling, Cryotherapy, and Moist heat  PLAN FOR NEXT SESSION: assess HEP response, ankle strength/ROM   Jannette Spanner, PTA 03/16/23 8:01 AM Phone: [phone] Fax: [phone]

## 2023-03-22 ENCOUNTER — Encounter: Payer: Self-pay | Admitting: Physical Therapy

## 2023-03-22 ENCOUNTER — Ambulatory Visit: Payer: BC Managed Care – PPO | Admitting: Physical Therapy

## 2023-03-22 DIAGNOSIS — M25572 Pain in left ankle and joints of left foot: Secondary | ICD-10-CM | POA: Diagnosis not present

## 2023-03-22 DIAGNOSIS — M6281 Muscle weakness (generalized): Secondary | ICD-10-CM

## 2023-03-22 DIAGNOSIS — R2689 Other abnormalities of gait and mobility: Secondary | ICD-10-CM

## 2023-03-22 NOTE — Therapy (Signed)
OUTPATIENT PHYSICAL THERAPY TREATMENT   Patient Name: Lisa Mosley MRN: [id number] DOB:[birth year], 47 y.o., female Today's Date: 03/22/2023   END OF SESSION:  PT End of Session - 03/22/23 0718     Visit Number 4    Number of Visits 17    Date for PT Re-Evaluation 04/29/23    Authorization Type BCBS    PT Start Time 0717    PT Stop Time 0755    PT Time Calculation (min) 38 min              Past Medical History:  Diagnosis Date   HTN (hypertension)    Migraines    Nephrolithiasis    PVC (premature ventricular contraction)    Stomach tumor (benign)    Past Surgical History:  Procedure Laterality Date   ABDOMINAL HYSTERECTOMY     APPENDECTOMY     CHOLECYSTECTOMY     HERNIA REPAIR     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)     TUBAL LIGATION     Now reversed   Patient Active Problem List   Diagnosis Date Noted   PVC's (premature ventricular contractions) 07/13/2017   Fatigue 07/13/2017   Hypertension 07/13/2017   Severe dysplasia of cervix 04/17/2015    PCP: Irven Coe, MD  REFERRING PROVIDER: Edwin Cap, DPM  REFERRING DIAG:  [phone] (ICD-10-CM) - Closed fracture of left ankle with routine healing, subsequent encounter M25.372 (ICD-10-CM) - Ankle instability, left  THERAPY DIAG:  Pain in left ankle and joints of left foot  Other abnormalities of gait and mobility  Muscle weakness (generalized)  Rationale for Evaluation and Treatment: Rehabilitation  ONSET DATE: 02/01/2023   SUBJECTIVE:  SUBJECTIVE STATEMENT: I feel a pop or a slide in my ankle when I am walking. Sometimes just with moving the foot around too.   Patient reports she feels like the ankle is getting better, consistent with the exercises.   PERTINENT HISTORY: HTN, Bariatric surgery  PAIN:  Are you having pain?  Yes: NPRS scale: 2-3/10 Worst: 8/10 Pain location: lateral L ankle Pain description: Aching, pulling Aggravating factors:  walking, prolonged standing Relieving factors: rest, ice  PRECAUTIONS: None  WEIGHT BEARING RESTRICTIONS: Yes WBAT in CAM boot  FALLS:  Has patient fallen in last 6 months? Yes. Number of falls - 4; first is the L ankle injury, 3 falls while using crutches  PATIENT GOALS: decrease pain in L ankle, improve ankle stability in order to get back to being active  NEXT MD VISIT: 03/29/2023   OBJECTIVE:  Note: Objective measures were completed at Evaluation unless otherwise noted. PATIENT SURVEYS:  FOTO: 47% function; 66% predicted   EDEMA:  DNT  POSTURE: weight shift right  PALPATION: TTP to L fibularis longus/brevis  LOWER EXTREMITY ROM:  Active ROM Right eval Left eval Left 03/08/2023 Left  03/22/23  Hip flexion      Hip extension      Hip abduction      Hip adduction      Hip internal rotation      Hip external rotation      Knee flexion      Knee extension      Ankle dorsiflexion 10 0 5 6  Ankle plantarflexion   45   Ankle inversion   30 30  Ankle eversion   5 15   (Blank rows = not tested)  LOWER EXTREMITY MMT:  MMT Right eval Left eval Left 03/22/23  Hip flexion     Hip  extension     Hip abduction     Hip adduction     Hip internal rotation     Hip external rotation     Knee flexion     Knee extension     Ankle dorsiflexion 5 2+ 4-  Ankle plantarflexion 4 3   Ankle inversion 5 3   Ankle eversion 5 3    (Blank rows = not tested)  LOWER EXTREMITY SPECIAL TESTS:  DNT  FUNCTIONAL TESTS:  Five Time Sit to Stand: 20 seconds with UE  GAIT: Distance walked: 33ft Assistive device utilized: Crutches and L CAM boot Level of assistance: Modified independence Comments: decreased gait speed, R lean   TREATMENT: [facility] Adult PT Treatment:                                                DATE: 03/22/23 Therapeutic Exercise: Ankle circles AROM CW/CCW x 10 each  Seated heel raises 20 x 2  Seated toe raises 20 x 2  Seated BAPS L3 fwd/bwd,  lateral  taps, circles  2 x 10 each Seated DF, IN, EV red band x 20 each  DF Stretch and soleus stretch with towel, sitting 30 sec x 2 each  Mat SLR 2# 2 x 10,  3 way    [facility] Adult PT Treatment:                                                DATE: 03/16/23 Therapeutic Exercise: Ankle circles AROM CW/CCW x 10 each  Seated heel raises 20 x 2  Seated toe raises 20 x 2  DF Stretch with towel long sitting 30 sec x 2  Seated BAPS L3 fwd/bwd,  lateral taps, circles  1 x 10 each Longsitting ankle inversion and eversion with yellow 2 x 10 Longsitting ankle DF yellow 2 x 10, instructed in seated version  Mat SLR 3 way x 10 each bilateral. Decreased endurance left LE    [facility] Adult PT Treatment:                                                DATE: 03/08/2023 Therapeutic Exercise: Longsitting calf stretch 3 x 30 sec Longsitting ankle PF and DF with red 3 x 15 Seated ankle inversion and eversion with towel 2 x 20 Seated towel scrunches 2 x 20 Seated heel toe raises 2 x 20 Seated BAPS L3 fwd/bwd and lateral taps 2 x 10 each Longsitting ankle inversion and eversion with yellow 2 x 10   [facility] Adult PT Treatment:                                                DATE: 03/04/2023 Therapeutic Exercise: Ankle pumps x 20 Long sitting calf stretch with towel x 30" L Ankle inversion/eversion towel slide x 10 each L Seated heel raise x 15  PATIENT EDUCATION:  Education details: HEP update Person educated: Patient Education method: Explanation, Demonstration, and  Handouts Education comprehension: verbalized understanding and returned demonstration  HOME EXERCISE PROGRAM: Access Code: [email or web address]   ASSESSMENT: CLINICAL IMPRESSION: Pt arrives with reports of sliding/popping with ROM and walking, located at lateral ankle.  Continued with ROM and open chain strengthening. Progressed  open chain hip strengthening with resistance.   Updated HEP to progress theraband resistance.  DF AROM and DF strength have  improved. Patient would benefit from continued skilled PT to progress her ankle mobility and strength in order to reduce pain and maximize functional ability.   OBJECTIVE IMPAIRMENTS: Abnormal gait, decreased activity tolerance, decreased balance, decreased mobility, difficulty walking, decreased ROM, decreased strength, and pain  ACTIVITY LIMITATIONS: standing, squatting, stairs, transfers, and locomotion level  PARTICIPATION LIMITATIONS: driving, shopping, community activity, occupation, and yard work  PERSONAL FACTORS: Time since onset of injury/illness/exacerbation and 1-2 comorbidities: HTN, Bariatric surgery  are also affecting patient's functional outcome.    GOALS: Goals reviewed with patient? No  SHORT TERM GOALS: Target date: 03/25/2023   Pt will be compliant and knowledgeable with initial HEP for improved comfort and carryover Baseline: initial HEP given  Goal status: INITIAL  2.  Pt will self report left ankle pain no greater than 5/10 for improved comfort and functional ability Baseline: 7/10 at worst Goal status: INITIAL   LONG TERM GOALS: Target date: 04/29/2023   Pt will improve FOTO function score to no less than 66% as proxy for functional improvement with home ADLs and community activities Baseline: 47% function Goal status: INITIAL   2.  Pt will self report left ankle pain no greater than 2/10 for improved comfort and functional ability Baseline: 7/10 at worst Goal status: INITIAL   3.  Pt will decrease Five Time Sit to Stand time to no less than 15 seconds for improved balance, strength, and functional mobility Baseline: 20 seconds with UE Goal status: INITIAL    4.  Pt will improve L ankle DF ROM to no less than 10 degrees for improved ankle mobility and gait Baseline: 0 degrees Goal status: INITIAL  5.  Pt will be able to ambulate approximately one mile with LRAD and no increase in ankle pain for improved comfort and functional mobility and get back  to higher level activities Baseline: unable Goal status: INITIAL   PLAN: PT FREQUENCY: 1-2x/week  PT DURATION: 8 weeks  PLANNED INTERVENTIONS: 97164- PT Re-evaluation, 97110-Therapeutic exercises, 97530- Therapeutic activity, 97112- Neuromuscular re-education, 97535- Self Care, 16109- Manual therapy, [id number]- Gait training, [id number]- Aquatic Therapy, 97014- Electrical stimulation (unattended), [id number]- Electrical stimulation (manual), 97016- Vasopneumatic device, Dry Needling, Cryotherapy, and Moist heat  PLAN FOR NEXT SESSION: assess HEP response, ankle strength/ROM   Jannette Spanner, PTA 03/22/23 8:58 AM Phone: [phone] Fax: [phone]

## 2023-03-25 ENCOUNTER — Encounter: Payer: Self-pay | Admitting: Physical Therapy

## 2023-03-25 ENCOUNTER — Ambulatory Visit: Payer: BC Managed Care – PPO | Admitting: Physical Therapy

## 2023-03-25 DIAGNOSIS — R2689 Other abnormalities of gait and mobility: Secondary | ICD-10-CM

## 2023-03-25 DIAGNOSIS — M6281 Muscle weakness (generalized): Secondary | ICD-10-CM

## 2023-03-25 DIAGNOSIS — M25572 Pain in left ankle and joints of left foot: Secondary | ICD-10-CM | POA: Diagnosis not present

## 2023-03-25 NOTE — Therapy (Signed)
OUTPATIENT PHYSICAL THERAPY TREATMENT   Patient Name: Lisa Mosley MRN: [id number] DOB:August 25, 1975, 47 y.o., female Today's Date: 03/25/2023   END OF SESSION:  PT End of Session - 03/25/23 0715     Visit Number 5    Number of Visits 17    Date for PT Re-Evaluation 04/29/23    Authorization Type BCBS    PT Start Time 0715    PT Stop Time 0753    PT Time Calculation (min) 38 min              Past Medical History:  Diagnosis Date   HTN (hypertension)    Migraines    Nephrolithiasis    PVC (premature ventricular contraction)    Stomach tumor (benign)    Past Surgical History:  Procedure Laterality Date   ABDOMINAL HYSTERECTOMY     APPENDECTOMY     CHOLECYSTECTOMY     HERNIA REPAIR     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)     TUBAL LIGATION     Now reversed   Patient Active Problem List   Diagnosis Date Noted   PVC's (premature ventricular contractions) 07/13/2017   Fatigue 07/13/2017   Hypertension 07/13/2017   Severe dysplasia of cervix 04/17/2015    PCP: Irven Coe, MD  REFERRING PROVIDER: Edwin Cap, DPM  REFERRING DIAG:  [phone] (ICD-10-CM) - Closed fracture of left ankle with routine healing, subsequent encounter M25.372 (ICD-10-CM) - Ankle instability, left  THERAPY DIAG:  Pain in left ankle and joints of left foot  Other abnormalities of gait and mobility  Muscle weakness (generalized)  Rationale for Evaluation and Treatment: Rehabilitation  ONSET DATE: 02/01/2023   SUBJECTIVE:  SUBJECTIVE STATEMENT: No pain on arrival. I still feel a pop or a slide in my ankle when I am walking. Sometimes just with moving the foot around too.    PERTINENT HISTORY: HTN, Bariatric surgery  PAIN:  Are you having pain?  Yes: NPRS scale: 0/10 Worst: 8/10 Pain location: lateral L ankle Pain description: Aching, pulling Aggravating factors: walking, prolonged standing Relieving factors: rest,  ice  PRECAUTIONS: None  WEIGHT BEARING RESTRICTIONS: Yes WBAT in CAM boot  FALLS:  Has patient fallen in last 6 months? Yes. Number of falls - 4; first is the L ankle injury, 3 falls while using crutches  PATIENT GOALS: decrease pain in L ankle, improve ankle stability in order to get back to being active  NEXT MD VISIT: 03/29/2023   OBJECTIVE:  Note: Objective measures were completed at Evaluation unless otherwise noted. PATIENT SURVEYS:  FOTO: 47% function; 66% predicted   EDEMA:  DNT  POSTURE: weight shift right  PALPATION: TTP to L fibularis longus/brevis  LOWER EXTREMITY ROM:  Active ROM Right eval Left eval Left 03/08/2023 Left  03/22/23  Hip flexion      Hip extension      Hip abduction      Hip adduction      Hip internal rotation      Hip external rotation      Knee flexion      Knee extension      Ankle dorsiflexion 10 0 5 6  Ankle plantarflexion   45   Ankle inversion   30 30  Ankle eversion   5 15   (Blank rows = not tested)  LOWER EXTREMITY MMT:  MMT Right eval Left eval Left 03/22/23  Hip flexion     Hip extension     Hip abduction  Hip adduction     Hip internal rotation     Hip external rotation     Knee flexion     Knee extension     Ankle dorsiflexion 5 2+ 4-  Ankle plantarflexion 4 3   Ankle inversion 5 3   Ankle eversion 5 3    (Blank rows = not tested)  LOWER EXTREMITY SPECIAL TESTS:  DNT  FUNCTIONAL TESTS:  Five Time Sit to Stand: 20 seconds with UE  GAIT: Distance walked: 53ft Assistive device utilized: Crutches and L CAM boot Level of assistance: Modified independence Comments: decreased gait speed, R lean   TREATMENT: [facility] Adult PT Treatment:                                                DATE: 03/25/23 Therapeutic Exercise: Rec Bike L1 x 5 minutes  Seated heel raises 20 x 2  Seated toe raises 20 x 2  Seated BAPS L3 fwd/bwd,  lateral taps, circles  2 x 10 each Seated DF, IN, EV red band x 20 each ; PF  Green x 20  DF Stretch and soleus stretch with towel, sitting 30 sec x 2 each  Mat SLR 2# 1 x 15,  3 way     [facility] Adult PT Treatment:                                                DATE: 03/22/23 Therapeutic Exercise: Ankle circles AROM CW/CCW x 10 each  Seated heel raises 20 x 2  Seated toe raises 20 x 2  Seated BAPS L3 fwd/bwd,  lateral taps, circles  2 x 10 each Seated DF, IN, EV red band x 20 each  DF Stretch and soleus stretch with towel, sitting 30 sec x 2 each  Mat SLR 2# 2 x 10,  3 way    [facility] Adult PT Treatment:                                                DATE: 03/16/23 Therapeutic Exercise: Ankle circles AROM CW/CCW x 10 each  Seated heel raises 20 x 2  Seated toe raises 20 x 2  DF Stretch with towel long sitting 30 sec x 2  Seated BAPS L3 fwd/bwd,  lateral taps, circles  1 x 10 each Longsitting ankle inversion and eversion with yellow 2 x 10 Longsitting ankle DF yellow 2 x 10, instructed in seated version  Mat SLR 3 way x 10 each bilateral. Decreased endurance left LE    [facility] Adult PT Treatment:                                                DATE: 03/08/2023 Therapeutic Exercise: Longsitting calf stretch 3 x 30 sec Longsitting ankle PF and DF with red 3 x 15 Seated ankle inversion and eversion with towel 2 x 20 Seated towel scrunches 2 x 20 Seated heel toe raises 2 x 20  Seated BAPS L3 fwd/bwd and lateral taps 2 x 10 each Longsitting ankle inversion and eversion with yellow 2 x 10   [facility] Adult PT Treatment:                                                DATE: 03/04/2023 Therapeutic Exercise: Ankle pumps x 20 Long sitting calf stretch with towel x 30" L Ankle inversion/eversion towel slide x 10 each L Seated heel raise x 15  PATIENT EDUCATION:  Education details: HEP update Person educated: Patient Education method: Explanation, Demonstration, and Handouts Education comprehension: verbalized understanding and returned demonstration  HOME EXERCISE  PROGRAM: Access Code: [email or web address]   ASSESSMENT: CLINICAL IMPRESSION: Pt arrives with reports of continued sliding/popping with ROM and walking, located at lateral ankle. Pain at worst 3/10. Independent with HEP. STG #1, #2 met.  Continued with ROM and open chain strengthening. Pt brought her other shoe so began Recumbent bike for conditioning.  Patient would benefit from continued skilled PT to progress her ankle mobility and strength in order to reduce pain and maximize functional ability. Sees MD 03/29/23. Hopeful to be released from [facility].    OBJECTIVE IMPAIRMENTS: Abnormal gait, decreased activity tolerance, decreased balance, decreased mobility, difficulty walking, decreased ROM, decreased strength, and pain  ACTIVITY LIMITATIONS: standing, squatting, stairs, transfers, and locomotion level  PARTICIPATION LIMITATIONS: driving, shopping, community activity, occupation, and yard work  PERSONAL FACTORS: Time since onset of injury/illness/exacerbation and 1-2 comorbidities: HTN, Bariatric surgery  are also affecting patient's functional outcome.    GOALS: Goals reviewed with patient? No  SHORT TERM GOALS: Target date: 03/25/2023   Pt will be compliant and knowledgeable with initial HEP for improved comfort and carryover Baseline: initial HEP given  Goal status: MET  2.  Pt will self report left ankle pain no greater than 5/10 for improved comfort and functional ability Baseline: 7/10 at worst 03/25/23: 3/10 at worst Goal status: MET   LONG TERM GOALS: Target date: 04/29/2023   Pt will improve FOTO function score to no less than 66% as proxy for functional improvement with home ADLs and community activities Baseline: 47% function Goal status: INITIAL   2.  Pt will self report left ankle pain no greater than 2/10 for improved comfort and functional ability Baseline: 7/10 at worst Goal status: INITIAL   3.  Pt will decrease Five Time Sit to Stand time to no less than 15  seconds for improved balance, strength, and functional mobility Baseline: 20 seconds with UE Goal status: INITIAL    4.  Pt will improve L ankle DF ROM to no less than 10 degrees for improved ankle mobility and gait Baseline: 0 degrees Goal status: INITIAL  5.  Pt will be able to ambulate approximately one mile with LRAD and no increase in ankle pain for improved comfort and functional mobility and get back to higher level activities Baseline: unable Goal status: INITIAL   PLAN: PT FREQUENCY: 1-2x/week  PT DURATION: 8 weeks  PLANNED INTERVENTIONS: 97164- PT Re-evaluation, 97110-Therapeutic exercises, 97530- Therapeutic activity, 97112- Neuromuscular re-education, 97535- Self Care, 30865- Manual therapy, [id number]- Gait training, [id number]- Aquatic Therapy, 97014- Electrical stimulation (unattended), [id number]- Electrical stimulation (manual), 97016- Vasopneumatic device, Dry Needling, Cryotherapy, and Moist heat  PLAN FOR NEXT SESSION: assess HEP response, ankle strength/ROM   Jannette Spanner, PTA 03/25/23 7:45 AM Phone: [phone] Fax: [phone]

## 2023-03-28 ENCOUNTER — Encounter: Payer: Self-pay | Admitting: Physical Therapy

## 2023-03-28 ENCOUNTER — Ambulatory Visit: Payer: BC Managed Care – PPO | Admitting: Physical Therapy

## 2023-03-28 DIAGNOSIS — M6281 Muscle weakness (generalized): Secondary | ICD-10-CM

## 2023-03-28 DIAGNOSIS — M25572 Pain in left ankle and joints of left foot: Secondary | ICD-10-CM

## 2023-03-28 DIAGNOSIS — R2689 Other abnormalities of gait and mobility: Secondary | ICD-10-CM

## 2023-03-28 NOTE — Therapy (Signed)
OUTPATIENT PHYSICAL THERAPY TREATMENT   Patient Name: Lisa Mosley MRN: [id number] DOB:[birth year], 47 y.o., female Today's Date: 03/28/2023   END OF SESSION:  PT End of Session - 03/28/23 0757     Visit Number 6    Number of Visits 17    Date for PT Re-Evaluation 04/29/23    Authorization Type BCBS    PT Start Time 0800    PT Stop Time 0832    PT Time Calculation (min) 32 min              Past Medical History:  Diagnosis Date   HTN (hypertension)    Migraines    Nephrolithiasis    PVC (premature ventricular contraction)    Stomach tumor (benign)    Past Surgical History:  Procedure Laterality Date   ABDOMINAL HYSTERECTOMY     APPENDECTOMY     CHOLECYSTECTOMY     HERNIA REPAIR     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)     TUBAL LIGATION     Now reversed   Patient Active Problem List   Diagnosis Date Noted   PVC's (premature ventricular contractions) 07/13/2017   Fatigue 07/13/2017   Hypertension 07/13/2017   Severe dysplasia of cervix 04/17/2015    PCP: Irven Coe, MD  REFERRING PROVIDER: Edwin Cap, DPM  REFERRING DIAG:  [phone] (ICD-10-CM) - Closed fracture of left ankle with routine healing, subsequent encounter M25.372 (ICD-10-CM) - Ankle instability, left  THERAPY DIAG:  Pain in left ankle and joints of left foot  Other abnormalities of gait and mobility  Muscle weakness (generalized)  Rationale for Evaluation and Treatment: Rehabilitation  ONSET DATE: 02/01/2023   SUBJECTIVE:  SUBJECTIVE STATEMENT: No pain on arrival. I still feel a pop or a slide in my ankle when I am walking. A little more stiff this morning.    PERTINENT HISTORY: HTN, Bariatric surgery  PAIN:  Are you having pain?  Yes: NPRS scale: 0/10 Worst: 8/10 Pain location: lateral L ankle Pain description: Aching, pulling Aggravating factors: walking, prolonged standing Relieving factors: rest, ice  PRECAUTIONS:  None  WEIGHT BEARING RESTRICTIONS: Yes WBAT in CAM boot  FALLS:  Has patient fallen in last 6 months? Yes. Number of falls - 4; first is the L ankle injury, 3 falls while using crutches  PATIENT GOALS: decrease pain in L ankle, improve ankle stability in order to get back to being active  NEXT MD VISIT: 03/29/2023   OBJECTIVE:  Note: Objective measures were completed at Evaluation unless otherwise noted. PATIENT SURVEYS:  FOTO: 47% function; 66% predicted   EDEMA:  DNT  POSTURE: weight shift right  PALPATION: TTP to L fibularis longus/brevis  LOWER EXTREMITY ROM:  Active ROM Right eval Left eval Left 03/08/2023 Left  03/22/23 Left 03/28/23  Hip flexion       Hip extension       Hip abduction       Hip adduction       Hip internal rotation       Hip external rotation       Knee flexion       Knee extension       Ankle dorsiflexion 10 0 5 6 10  Prom  Ankle plantarflexion   45    Ankle inversion   30 30   Ankle eversion   5 15    (Blank rows = not tested)  LOWER EXTREMITY MMT:  MMT Right eval Left eval Left 03/22/23  Hip flexion  Hip extension     Hip abduction     Hip adduction     Hip internal rotation     Hip external rotation     Knee flexion     Knee extension     Ankle dorsiflexion 5 2+ 4-  Ankle plantarflexion 4 3   Ankle inversion 5 3   Ankle eversion 5 3    (Blank rows = not tested)  LOWER EXTREMITY SPECIAL TESTS:  DNT  FUNCTIONAL TESTS:  Five Time Sit to Stand: 20 seconds with UE  GAIT: Distance walked: 74ft Assistive device utilized: Crutches and L CAM boot Level of assistance: Modified independence Comments: decreased gait speed, R lean   TREATMENT: [facility] Adult PT Treatment:                                                DATE: 03/28/23 Therapeutic Exercise:  Rec Bike L2 x 5 minutes  Seated heel raises 20 x 2  Seated toe raises 20 x 2  Seated BAPS L3 fwd/bwd,  lateral taps, circles  2 x 10 each Seated DF, IN, EV Green  band x 20 each ; PF Blue x 20  DF Stretch and soleus stretch with towel, sitting 30 sec x 2 each  Mat SLR 3# 1 x 12,  3 way    [facility] Adult PT Treatment:                                                DATE: 03/25/23 Therapeutic Exercise: Rec Bike L1 x 5 minutes  Seated heel raises 20 x 2  Seated toe raises 20 x 2  Seated BAPS L3 fwd/bwd,  lateral taps, circles  2 x 10 each Seated DF, IN, EV red band x 20 each ; PF Green x 20  DF Stretch and soleus stretch with towel, sitting 30 sec x 2 each  Mat SLR 2# 1 x 15,  3 way     [facility] Adult PT Treatment:                                                DATE: 03/22/23 Therapeutic Exercise: Ankle circles AROM CW/CCW x 10 each  Seated heel raises 20 x 2  Seated toe raises 20 x 2  Seated BAPS L3 fwd/bwd,  lateral taps, circles  2 x 10 each Seated DF, IN, EV red band x 20 each  DF Stretch and soleus stretch with towel, sitting 30 sec x 2 each  Mat SLR 2# 2 x 10,  3 way    [facility] Adult PT Treatment:                                                DATE: 03/16/23 Therapeutic Exercise: Ankle circles AROM CW/CCW x 10 each  Seated heel raises 20 x 2  Seated toe raises 20 x 2  DF Stretch with towel long sitting 30 sec x 2  Seated BAPS  L3 fwd/bwd,  lateral taps, circles  1 x 10 each Longsitting ankle inversion and eversion with yellow 2 x 10 Longsitting ankle DF yellow 2 x 10, instructed in seated version  Mat SLR 3 way x 10 each bilateral. Decreased endurance left LE    [facility] Adult PT Treatment:                                                DATE: 03/08/2023 Therapeutic Exercise: Longsitting calf stretch 3 x 30 sec Longsitting ankle PF and DF with red 3 x 15 Seated ankle inversion and eversion with towel 2 x 20 Seated towel scrunches 2 x 20 Seated heel toe raises 2 x 20 Seated BAPS L3 fwd/bwd and lateral taps 2 x 10 each Longsitting ankle inversion and eversion with yellow 2 x 10   [facility] Adult PT Treatment:                                                 DATE: 03/04/2023 Therapeutic Exercise: Ankle pumps x 20 Long sitting calf stretch with towel x 30" L Ankle inversion/eversion towel slide x 10 each L Seated heel raise x 15  PATIENT EDUCATION:  Education details: HEP update Person educated: Patient Education method: Explanation, Demonstration, and Handouts Education comprehension: verbalized understanding and returned demonstration  HOME EXERCISE PROGRAM: Access Code: [email or web address]   ASSESSMENT: CLINICAL IMPRESSION: Pt arrives with reports of continued sliding/popping with ROM and walking, located at lateral ankle. Sees MD tomorrow for xrays and folloup. Hopeful to be cleared to remove cam boot. Progressed theraband resistance for open chain strengthening and issued for HEP. Will plan to capture FOTO at next session. Patient would benefit from continued skilled PT to progress her ankle mobility and strength in order to reduce pain and maximize functional ability.    OBJECTIVE IMPAIRMENTS: Abnormal gait, decreased activity tolerance, decreased balance, decreased mobility, difficulty walking, decreased ROM, decreased strength, and pain  ACTIVITY LIMITATIONS: standing, squatting, stairs, transfers, and locomotion level  PARTICIPATION LIMITATIONS: driving, shopping, community activity, occupation, and yard work  PERSONAL FACTORS: Time since onset of injury/illness/exacerbation and 1-2 comorbidities: HTN, Bariatric surgery  are also affecting patient's functional outcome.    GOALS: Goals reviewed with patient? No  SHORT TERM GOALS: Target date: 03/25/2023   Pt will be compliant and knowledgeable with initial HEP for improved comfort and carryover Baseline: initial HEP given  Goal status: MET  2.  Pt will self report left ankle pain no greater than 5/10 for improved comfort and functional ability Baseline: 7/10 at worst 03/25/23: 3/10 at worst Goal status: MET   LONG TERM GOALS: Target date: 04/29/2023   Pt will improve  FOTO function score to no less than 66% as proxy for functional improvement with home ADLs and community activities Baseline: 47% function Goal status: INITIAL   2.  Pt will self report left ankle pain no greater than 2/10 for improved comfort and functional ability Baseline: 7/10 at worst Goal status: INITIAL   3.  Pt will decrease Five Time Sit to Stand time to no less than 15 seconds for improved balance, strength, and functional mobility Baseline: 20 seconds with UE Goal status: INITIAL    4.  Pt will improve  L ankle DF ROM to no less than 10 degrees for improved ankle mobility and gait Baseline: 0 degrees Goal status: INITIAL  5.  Pt will be able to ambulate approximately one mile with LRAD and no increase in ankle pain for improved comfort and functional mobility and get back to higher level activities Baseline: unable Goal status: INITIAL   PLAN: PT FREQUENCY: 1-2x/week  PT DURATION: 8 weeks  PLANNED INTERVENTIONS: 97164- PT Re-evaluation, 97110-Therapeutic exercises, 97530- Therapeutic activity, 97112- Neuromuscular re-education, 97535- Self Care, 40981- Manual therapy, [id number]- Gait training, [id number]- Aquatic Therapy, 97014- Electrical stimulation (unattended), [id number]- Electrical stimulation (manual), 97016- Vasopneumatic device, Dry Needling, Cryotherapy, and Moist heat  PLAN FOR NEXT SESSION: How was MD visit? Get FOTO update    Jannette Spanner, [place] 03/28/23 8:35 AM Phone: [phone] Fax: [phone]

## 2023-03-29 ENCOUNTER — Ambulatory Visit: Payer: BC Managed Care – PPO | Admitting: Podiatry

## 2023-03-29 ENCOUNTER — Encounter: Payer: Self-pay | Admitting: Podiatry

## 2023-03-29 ENCOUNTER — Ambulatory Visit (INDEPENDENT_AMBULATORY_CARE_PROVIDER_SITE_OTHER): Payer: BC Managed Care – PPO

## 2023-03-29 DIAGNOSIS — S82892D Other fracture of left lower leg, subsequent encounter for closed fracture with routine healing: Secondary | ICD-10-CM

## 2023-03-29 DIAGNOSIS — M25372 Other instability, left ankle: Secondary | ICD-10-CM

## 2023-03-29 NOTE — Progress Notes (Signed)
  Subjective:  Patient ID: Lisa Mosley, female    DOB: 1975-11-19,  MRN: [id number]  Chief Complaint  Patient presents with   Fracture    Follow up fracture ankle left   "There is no pain really, but like a clicking, sliding feeling"      Discussed the use of AI scribe software for clinical note transcription with the patient, who gave verbal consent to proceed.  History of Present Illness   She returns for follow-up pain is much improved there is clicking in the outside and back of the joint therapy is helping      Objective:    Physical Exam   EXTREMITIES: No pain on the distal fibula. No pain on anterior ankle syndesmosis, fifth metatarsal, or navicular. Pulses palpable. Plus two DP and PT. Good capillary fill time.       No images are attached to the encounter.    Results   RADIOLOGY Left ankle x-ray: New radiographs taken today show maintained ankle mortise alignment no change or increase in the tib-fib overlap or medial clear space, good interval healing of fibular fracture  Assessment:   Encounter Diagnoses  Name Primary?   Closed fracture of left ankle with routine healing, subsequent encounter Yes   Ankle instability, left       Plan:  Patient was evaluated and treated and all questions answered.  Assessment and Plan    Left Ankle Fracture   Doing much better fibula is nearly fully healed at this point she may transition out of the cam boot and back to a lace up ankle brace with figure-of-eight support and a supportive shoe.  Continue physical therapy for strengthening and stabilization and mobilization.  I will see her back in 8 weeks for follow-up and return to full activity at that point.  Return in about 8 weeks (around 05/24/2023) for follow up on ankle fracture (new xrays).

## 2023-04-06 ENCOUNTER — Ambulatory Visit: Payer: BC Managed Care – PPO | Admitting: Physical Therapy

## 2023-04-13 ENCOUNTER — Encounter: Payer: Self-pay | Admitting: Physical Therapy

## 2023-04-13 ENCOUNTER — Ambulatory Visit: Payer: BC Managed Care – PPO | Attending: Podiatry | Admitting: Physical Therapy

## 2023-04-13 DIAGNOSIS — M25572 Pain in left ankle and joints of left foot: Secondary | ICD-10-CM | POA: Insufficient documentation

## 2023-04-13 DIAGNOSIS — M6281 Muscle weakness (generalized): Secondary | ICD-10-CM | POA: Diagnosis present

## 2023-04-13 DIAGNOSIS — R2689 Other abnormalities of gait and mobility: Secondary | ICD-10-CM | POA: Insufficient documentation

## 2023-04-13 NOTE — Therapy (Signed)
OUTPATIENT PHYSICAL THERAPY TREATMENT   Patient Name: Lisa Mosley MRN: [id number] DOB:08-Apr-1976, 47 y.o., female Today's Date: 04/13/2023   END OF SESSION:  PT End of Session - 04/13/23 0721     Visit Number 7    Number of Visits 17    Date for PT Re-Evaluation 04/29/23    Authorization Type BC[employer]    PT Start Time 0718    PT Stop Time 0756    PT Time Calculation (min) 38 min              Past Medical History:  Diagnosis Date   HTN (hypertension)    Migraines    Nephrolithiasis    PVC (premature ventricular contraction)    Stomach tumor (benign)    Past Surgical History:  Procedure Laterality Date   ABDOMINAL HYSTERECTOMY     APPENDECTOMY     CHOLECYSTECTOMY     HERNIA REPAIR     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)     TUBAL LIGATION     Now reversed   Patient Active Problem List   Diagnosis Date Noted   PVC's (premature ventricular contractions) 07/13/2017   Fatigue 07/13/2017   Hypertension 07/13/2017   Severe dysplasia of cervix 04/17/2015    PCP: Irven Coe, MD  REFERRING PROVIDER: Edwin Cap, DPM  REFERRING DIAG:  [phone] (ICD-10-CM) - Closed fracture of left ankle with routine healing, subsequent encounter M25.372 (ICD-10-CM) - Ankle instability, left  THERAPY DIAG:  Pain in left ankle and joints of left foot  Other abnormalities of gait and mobility  Muscle weakness (generalized)  Rationale for Evaluation and Treatment: Rehabilitation  ONSET DATE: 02/01/2023   SUBJECTIVE:  SUBJECTIVE STATEMENT: Saw MD, released from boot, wearing ASO. Pain level 3/10. Forgot my cane.    PERTINENT HISTORY: HTN, Bariatric surgery  PAIN:  Are you having pain?  Yes: NPRS scale: 3/10 Worst: 8/10 Pain location: lateral L ankle Pain description: Aching, pulling Aggravating factors: walking, prolonged standing Relieving factors: rest, ice  PRECAUTIONS: None  WEIGHT BEARING RESTRICTIONS: Yes WBAT in CAM  boot  FALLS:  Has patient fallen in last 6 months? Yes. Number of falls - 4; first is the L ankle injury, 3 falls while using crutches  PATIENT GOALS: decrease pain in L ankle, improve ankle stability in order to get back to being active  NEXT MD VISIT: 03/29/2023   OBJECTIVE:  Note: Objective measures were completed at Evaluation unless otherwise noted. PATIENT SURVEYS:  FOTO: 47% function; 66% predicted FOTO 60% function 04/13/23   EDEMA:  DNT  POSTURE: weight shift right  PALPATION: TTP to L fibularis longus/brevis  LOWER EXTREMITY ROM:  Active ROM Right eval Left eval Left 03/08/2023 Left  03/22/23 Left 03/28/23 Left 04/13/23  Hip flexion        Hip extension        Hip abduction        Hip adduction        Hip internal rotation        Hip external rotation        Knee flexion        Knee extension        Ankle dorsiflexion 10 0 5 6 10  Prom 4 A / 10 P  Ankle plantarflexion   45     Ankle inversion   30 30    Ankle eversion   5 15     (Blank rows = not tested)  LOWER EXTREMITY MMT:  MMT Right  eval Left eval Left 03/22/23 Left 04/13/23  Hip flexion      Hip extension      Hip abduction      Hip adduction      Hip internal rotation      Hip external rotation      Knee flexion      Knee extension      Ankle dorsiflexion 5 2+ 4-   Ankle plantarflexion 4 3    Ankle inversion 5 3  4   Ankle eversion 5 3  4  p   (Blank rows = not tested)  LOWER EXTREMITY SPECIAL TESTS:  DNT  FUNCTIONAL TESTS:  Five Time Sit to Stand: 20 seconds with UE 04/13/23: 5 x STS: 18.8 sec without UE  GAIT: Distance walked: 46ft Assistive device utilized: Crutches and L CAM boot Level of assistance: Modified independence Comments: decreased gait speed, R lean   TREATMENT: [facility] Adult PT Treatment:                                                DATE: 04/13/23 Therapeutic Exercise: Rec Bike L2 x 5 minutes  Standing heel raise x 10 + HEP Standing toe raise x 10 +  HEP Standing Gastroc stretch + HEP Standing Soleus stretch SLS 2 sec Left Tandem Stance 4 sec LLE back 3/4 Tandem 10 sec LLE back +HEP Supine Green band DF x 15  Modalities: Declined    [facility] Adult PT Treatment:                                                DATE: 03/28/23 Therapeutic Exercise:  Rec Bike L2 x 5 minutes  Seated heel raises 20 x 2  Seated toe raises 20 x 2  Seated BAPS L3 fwd/bwd,  lateral taps, circles  2 x 10 each Seated DF, IN, EV Green band x 20 each ; PF Blue x 20  DF Stretch and soleus stretch with towel, sitting 30 sec x 2 each  Mat SLR 3# 1 x 12,  3 way    [facility] Adult PT Treatment:                                                DATE: 03/25/23 Therapeutic Exercise: Rec Bike L1 x 5 minutes  Seated heel raises 20 x 2  Seated toe raises 20 x 2  Seated BAPS L3 fwd/bwd,  lateral taps, circles  2 x 10 each Seated DF, IN, EV red band x 20 each ; PF Green x 20  DF Stretch and soleus stretch with towel, sitting 30 sec x 2 each  Mat SLR 2# 1 x 15,  3 way     [facility] Adult PT Treatment:                                                DATE: 03/22/23 Therapeutic Exercise: Ankle circles AROM CW/CCW x 10 each  Seated heel raises 20 x  2  Seated toe raises 20 x 2  Seated BAPS L3 fwd/bwd,  lateral taps, circles  2 x 10 each Seated DF, IN, EV red band x 20 each  DF Stretch and soleus stretch with towel, sitting 30 sec x 2 each  Mat SLR 2# 2 x 10,  3 way    [facility] Adult PT Treatment:                                                DATE: 03/16/23 Therapeutic Exercise: Ankle circles AROM CW/CCW x 10 each  Seated heel raises 20 x 2  Seated toe raises 20 x 2  DF Stretch with towel long sitting 30 sec x 2  Seated BAPS L3 fwd/bwd,  lateral taps, circles  1 x 10 each Longsitting ankle inversion and eversion with yellow 2 x 10 Longsitting ankle DF yellow 2 x 10, instructed in seated version  Mat SLR 3 way x 10 each bilateral. Decreased endurance left LE    [facility] Adult PT  Treatment:                                                DATE: 03/08/2023 Therapeutic Exercise: Longsitting calf stretch 3 x 30 sec Longsitting ankle PF and DF with red 3 x 15 Seated ankle inversion and eversion with towel 2 x 20 Seated towel scrunches 2 x 20 Seated heel toe raises 2 x 20 Seated BAPS L3 fwd/bwd and lateral taps 2 x 10 each Longsitting ankle inversion and eversion with yellow 2 x 10   [facility] Adult PT Treatment:                                                DATE: 03/04/2023 Therapeutic Exercise: Ankle pumps x 20 Long sitting calf stretch with towel x 30" L Ankle inversion/eversion towel slide x 10 each L Seated heel raise x 15   PATIENT EDUCATION:  Education details: HEP update Person educated: Patient Education method: Explanation, Demonstration, and Handouts Education comprehension: verbalized understanding and returned demonstration  HOME EXERCISE PROGRAM: Access Code: [email or web address]   ASSESSMENT: CLINICAL IMPRESSION: Pt arrives with reports of continued sliding/popping with ROM and walking, located at lateral ankle. Saw MD who removed cam boot and stated that an MRI might be necessary if the peroneal tendon continues to slide/pop. Progressed to closed chain therex with good tolerance. Pt DF AROM 4 degrees and PROM 10 degrees so encouraged DF strengthening with toe raises and theraband DF.  FOTO status captured at indicates improved perception of function. SLS limited to 2 sec left so updated HEP with 3/4 tandem which she can hold 10 sec.  Patient would benefit from continued skilled PT to progress her ankle mobility and strength in order to reduce pain and maximize functional ability.    OBJECTIVE IMPAIRMENTS: Abnormal gait, decreased activity tolerance, decreased balance, decreased mobility, difficulty walking, decreased ROM, decreased strength, and pain  ACTIVITY LIMITATIONS: standing, squatting, stairs, transfers, and locomotion level  PARTICIPATION LIMITATIONS:  driving, shopping, community activity, occupation, and yard work  PERSONAL FACTORS: Time since onset of injury/illness/exacerbation  and 1-2 comorbidities: HTN, Bariatric surgery  are also affecting patient's functional outcome.    GOALS: Goals reviewed with patient? No  SHORT TERM GOALS: Target date: 03/25/2023   Pt will be compliant and knowledgeable with initial HEP for improved comfort and carryover Baseline: initial HEP given  Goal status: MET  2.  Pt will self report left ankle pain no greater than 5/10 for improved comfort and functional ability Baseline: 7/10 at worst 03/25/23: 3/10 at worst Goal status: MET   LONG TERM GOALS: Target date: 04/29/2023   Pt will improve FOTO function score to no less than 66% as proxy for functional improvement with home ADLs and community activities Baseline: 47% function 04/13/23: 60% Goal status: MET   2.  Pt will self report left ankle pain no greater than 2/10 for improved comfort and functional ability Baseline: 7/10 at worst Goal status: INITIAL   3.  Pt will decrease Five Time Sit to Stand time to no less than 15 seconds for improved balance, strength, and functional mobility Baseline: 20 seconds with UE 04/13/23: 18.8 sec  Goal status: ONGOING    4.  Pt will improve L ankle DF ROM to no less than 10 degrees for improved ankle mobility and gait Baseline: 0 degrees Goal status: INITIAL  5.  Pt will be able to ambulate approximately one mile with LRAD and no increase in ankle pain for improved comfort and functional mobility and get back to higher level activities Baseline: unable Goal status: INITIAL   PLAN: PT FREQUENCY: 1-2x/week  PT DURATION: 8 weeks  PLANNED INTERVENTIONS: 97164- PT Re-evaluation, 97110-Therapeutic exercises, 97530- Therapeutic activity, 97112- Neuromuscular re-education, 97535- Self Care, 91478- Manual therapy, [id number]- Gait training, [id number]- Aquatic Therapy, 97014- Electrical stimulation (unattended),  [id number]- Electrical stimulation (manual), 97016- Vasopneumatic device, Dry Needling, Cryotherapy, and Moist heat  PLAN FOR NEXT SESSION: progress PRN    Jannette Spanner, PTA 04/13/23 1:06 PM Phone: [phone] Fax: [phone]

## 2023-04-20 ENCOUNTER — Encounter: Payer: Self-pay | Admitting: Physical Therapy

## 2023-04-20 ENCOUNTER — Ambulatory Visit: Payer: BC Managed Care – PPO | Admitting: Physical Therapy

## 2023-04-20 DIAGNOSIS — R2689 Other abnormalities of gait and mobility: Secondary | ICD-10-CM

## 2023-04-20 DIAGNOSIS — M25572 Pain in left ankle and joints of left foot: Secondary | ICD-10-CM | POA: Diagnosis not present

## 2023-04-20 DIAGNOSIS — M6281 Muscle weakness (generalized): Secondary | ICD-10-CM

## 2023-04-20 NOTE — Therapy (Signed)
OUTPATIENT PHYSICAL THERAPY TREATMENT   Patient Name: Lisa Mosley MRN: [id number] DOB:1975-08-21, 47 y.o., female Today's Date: 04/20/2023   END OF SESSION:  PT End of Session - 04/20/23 0718     Visit Number 8    Number of Visits 17    Date for PT Re-Evaluation 04/29/23    Authorization Type [employer]BS    PT Start Time 0716    PT Stop Time 0755    PT Time Calculation (min) 39 min              Past Medical History:  Diagnosis Date   HTN (hypertension)    Migraines    Nephrolithiasis    PVC (premature ventricular contraction)    Stomach tumor (benign)    Past Surgical History:  Procedure Laterality Date   ABDOMINAL HYSTERECTOMY     APPENDECTOMY     CHOLECYSTECTOMY     HERNIA REPAIR     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)     TUBAL LIGATION     Now reversed   Patient Active Problem List   Diagnosis Date Noted   PVC's (premature ventricular contractions) 07/13/2017   Fatigue 07/13/2017   Hypertension 07/13/2017   Severe dysplasia of cervix 04/17/2015    PCP: Irven Coe, MD  REFERRING PROVIDER: Edwin Cap, DPM  REFERRING DIAG:  [phone] (ICD-10-CM) - Closed fracture of left ankle with routine healing, subsequent encounter M25.372 (ICD-10-CM) - Ankle instability, left  THERAPY DIAG:  Pain in left ankle and joints of left foot  Other abnormalities of gait and mobility  Muscle weakness (generalized)  Rationale for Evaluation and Treatment: Rehabilitation  ONSET DATE: 02/01/2023   SUBJECTIVE:  SUBJECTIVE STATEMENT: The ankle is hurting more. It might be the way I sit criss cross. Still hurts with inversion theraband. No pain right now. Was hurting this morning. Still have that tendon popping.     PERTINENT HISTORY: HTN, Bariatric surgery  PAIN:  Are you having pain?  Yes: NPRS scale: 0/10 Worst: 8/10 Pain location: lateral L ankle Pain description: Aching, pulling Aggravating factors: walking,  prolonged standing Relieving factors: rest, ice  PRECAUTIONS: None  WEIGHT BEARING RESTRICTIONS: Yes WBAT in CAM boot  FALLS:  Has patient fallen in last 6 months? Yes. Number of falls - 4; first is the L ankle injury, 3 falls while using crutches  PATIENT GOALS: decrease pain in L ankle, improve ankle stability in order to get back to being active  NEXT MD VISIT: 03/29/2023   OBJECTIVE:  Note: Objective measures were completed at Evaluation unless otherwise noted. PATIENT SURVEYS:  FOTO: 47% function; 66% predicted FOTO 60% function 04/13/23   EDEMA:  DNT  POSTURE: weight shift right  PALPATION: TTP to L fibularis longus/brevis  LOWER EXTREMITY ROM:  Active ROM Right eval Left eval Left 03/08/2023 Left  03/22/23 Left 03/28/23 Left 04/13/23 Left  04/20/23  Hip flexion         Hip extension         Hip abduction         Hip adduction         Hip internal rotation         Hip external rotation         Knee flexion         Knee extension         Ankle dorsiflexion 10 0 5 6 10  Prom 4 A / 10 P P 5 A/ 10  Ankle plantarflexion   45  Ankle inversion   30 30     Ankle eversion   5 15      (Blank rows = not tested)  LOWER EXTREMITY MMT:  MMT Right eval Left eval Left 03/22/23 Left 04/13/23  Hip flexion      Hip extension      Hip abduction      Hip adduction      Hip internal rotation      Hip external rotation      Knee flexion      Knee extension      Ankle dorsiflexion 5 2+ 4-   Ankle plantarflexion 4 3    Ankle inversion 5 3  4   Ankle eversion 5 3  4  p   (Blank rows = not tested)  LOWER EXTREMITY SPECIAL TESTS:  DNT  FUNCTIONAL TESTS:  Five Time Sit to Stand: 20 seconds with UE 04/13/23: 5 x STS: 18.8 sec without UE  GAIT: Distance walked: 46ft Assistive device utilized: Crutches and L CAM boot Level of assistance: Modified independence Comments: decreased gait speed, R lean   TREATMENT: [facility] Adult PT Treatment:                                                 DATE: 04/20/23 Therapeutic Exercise: Rec Bike L2 x 5 min Bilat heel raise x 10 Bilat conentric and left eccentric lower  Bilat toe raises 3/4 tandem with head turns  Tandem stance 30 sec with LLE  Left  SLS 9 sec  AIREX norml with head turns  AIREX [place] with head turns  AIREX 3/4 tandem , each A/P blue rocker board.  Seated DF with blue band  Supine DF with double red band     [facility] Adult PT Treatment:                                                DATE: 04/13/23 Therapeutic Exercise: Rec Bike L2 x 5 minutes  Standing heel raise x 10 + HEP Standing toe raise x 10 + HEP Standing Gastroc stretch + HEP Standing Soleus stretch SLS 2 sec Left Tandem Stance 4 sec LLE back 3/4 Tandem 10 sec LLE back +HEP Supine Green band DF x 15  Modalities: Declined    [facility] Adult PT Treatment:                                                DATE: 03/28/23 Therapeutic Exercise:  Rec Bike L2 x 5 minutes  Seated heel raises 20 x 2  Seated toe raises 20 x 2  Seated BAPS L3 fwd/bwd,  lateral taps, circles  2 x 10 each Seated DF, IN, EV Green band x 20 each ; PF Blue x 20  DF Stretch and soleus stretch with towel, sitting 30 sec x 2 each  Mat SLR 3# 1 x 12,  3 way    [facility] Adult PT Treatment:  DATE: 03/25/23 Therapeutic Exercise: Rec Bike L1 x 5 minutes  Seated heel raises 20 x 2  Seated toe raises 20 x 2  Seated BAPS L3 fwd/bwd,  lateral taps, circles  2 x 10 each Seated DF, IN, EV red band x 20 each ; PF Green x 20  DF Stretch and soleus stretch with towel, sitting 30 sec x 2 each  Mat SLR 2# 1 x 15,  3 way     [facility] Adult PT Treatment:                                                DATE: 03/22/23 Therapeutic Exercise: Ankle circles AROM CW/CCW x 10 each  Seated heel raises 20 x 2  Seated toe raises 20 x 2  Seated BAPS L3 fwd/bwd,  lateral taps, circles  2 x 10 each Seated DF, IN, EV red band x 20 each  DF  Stretch and soleus stretch with towel, sitting 30 sec x 2 each  Mat SLR 2# 2 x 10,  3 way    [facility] Adult PT Treatment:                                                DATE: 03/16/23 Therapeutic Exercise: Ankle circles AROM CW/CCW x 10 each  Seated heel raises 20 x 2  Seated toe raises 20 x 2  DF Stretch with towel long sitting 30 sec x 2  Seated BAPS L3 fwd/bwd,  lateral taps, circles  1 x 10 each Longsitting ankle inversion and eversion with yellow 2 x 10 Longsitting ankle DF yellow 2 x 10, instructed in seated version  Mat SLR 3 way x 10 each bilateral. Decreased endurance left LE    [facility] Adult PT Treatment:                                                DATE: 03/08/2023 Therapeutic Exercise: Longsitting calf stretch 3 x 30 sec Longsitting ankle PF and DF with red 3 x 15 Seated ankle inversion and eversion with towel 2 x 20 Seated towel scrunches 2 x 20 Seated heel toe raises 2 x 20 Seated BAPS L3 fwd/bwd and lateral taps 2 x 10 each Longsitting ankle inversion and eversion with yellow 2 x 10   [facility] Adult PT Treatment:                                                DATE: 03/04/2023 Therapeutic Exercise: Ankle pumps x 20 Long sitting calf stretch with towel x 30" L Ankle inversion/eversion towel slide x 10 each L Seated heel raise x 15   PATIENT EDUCATION:  Education details: HEP update Person educated: Patient Education method: Explanation, Demonstration, and Handouts Education comprehension: verbalized understanding and returned demonstration  HOME EXERCISE PROGRAM: Access Code: [email or web address]   ASSESSMENT: CLINICAL IMPRESSION: Pt arrives with reports of continued sliding/popping with ROM and walking, located at lateral ankle. Ankle has been hurting  more. Working on her HEP. Today she demonstrates improved hold times for Tandem and SLS compared to last week. Progressed to unlevel surface with pt being challenged with narrow stance, especially dynamic movement (head turns).  Progressed HEP to tandem stance and SLS. Progressed heel raises to focus eccentrics.  Her DF AROM remains less than her PROM. Continued to work on [employer] strengthening with resistance bands.  Patient would benefit from continued skilled PT to progress her ankle mobility and strength in order to reduce pain and maximize functional ability.    OBJECTIVE IMPAIRMENTS: Abnormal gait, decreased activity tolerance, decreased balance, decreased mobility, difficulty walking, decreased ROM, decreased strength, and pain  ACTIVITY LIMITATIONS: standing, squatting, stairs, transfers, and locomotion level  PARTICIPATION LIMITATIONS: driving, shopping, community activity, occupation, and yard work  PERSONAL FACTORS: Time since onset of injury/illness/exacerbation and 1-2 comorbidities: HTN, Bariatric surgery  are also affecting patient's functional outcome.    GOALS: Goals reviewed with patient? No  SHORT TERM GOALS: Target date: 03/25/2023   Pt will be compliant and knowledgeable with initial HEP for improved comfort and carryover Baseline: initial HEP given  Goal status: MET  2.  Pt will self report left ankle pain no greater than 5/10 for improved comfort and functional ability Baseline: 7/10 at worst 03/25/23: 3/10 at worst Goal status: MET   LONG TERM GOALS: Target date: 04/29/2023   Pt will improve FOTO function score to no less than 66% as proxy for functional improvement with home ADLs and community activities Baseline: 47% function 04/13/23: 60% Goal status: MET   2.  Pt will self report left ankle pain no greater than 2/10 for improved comfort and functional ability Baseline: 7/10 at worst Goal status: INITIAL   3.  Pt will decrease Five Time Sit to Stand time to no less than 15 seconds for improved balance, strength, and functional mobility Baseline: 20 seconds with UE 04/13/23: 18.8 sec  Goal status: ONGOING    4.  Pt will improve L ankle DF ROM to no less than 10 degrees for  improved ankle mobility and gait Baseline: 0 degrees Goal status: INITIAL  5.  Pt will be able to ambulate approximately one mile with LRAD and no increase in ankle pain for improved comfort and functional mobility and get back to higher level activities Baseline: unable Goal status: INITIAL   PLAN: PT FREQUENCY: 1-2x/week  PT DURATION: 8 weeks  PLANNED INTERVENTIONS: 97164- PT Re-evaluation, 97110-Therapeutic exercises, 97530- Therapeutic activity, 97112- Neuromuscular re-education, 97535- Self Care, 10272- Manual therapy, [id number]- Gait training, [id number]- Aquatic Therapy, 97014- Electrical stimulation (unattended), [id number]- Electrical stimulation (manual), 97016- Vasopneumatic device, Dry Needling, Cryotherapy, and Moist heat  PLAN FOR NEXT SESSION: progress PRN    Jannette Spanner, PTA 04/20/23 9:05 AM Phone: [phone] Fax: [phone]

## 2023-04-26 ENCOUNTER — Encounter: Payer: Self-pay | Admitting: Physical Therapy

## 2023-04-26 ENCOUNTER — Ambulatory Visit: Payer: BC Managed Care – PPO | Admitting: Physical Therapy

## 2023-04-26 DIAGNOSIS — M25572 Pain in left ankle and joints of left foot: Secondary | ICD-10-CM | POA: Diagnosis not present

## 2023-04-26 DIAGNOSIS — R2689 Other abnormalities of gait and mobility: Secondary | ICD-10-CM

## 2023-04-26 DIAGNOSIS — M6281 Muscle weakness (generalized): Secondary | ICD-10-CM

## 2023-04-26 NOTE — Therapy (Addendum)
OUTPATIENT PHYSICAL THERAPY TREATMENT   Patient Name: Lisa Mosley MRN: [id number] DOB:[birth year], 47 y.o., female Today's Date: 04/26/2023   END OF SESSION:  PT End of Session - 04/26/23 0847     Visit Number 9    Number of Visits 17    Date for PT Re-Evaluation 04/29/23    Authorization Type BCBS    PT Start Time 0845    PT Stop Time 0925    PT Time Calculation (min) 40 min              Past Medical History:  Diagnosis Date   HTN (hypertension)    Migraines    Nephrolithiasis    PVC (premature ventricular contraction)    Stomach tumor (benign)    Past Surgical History:  Procedure Laterality Date   ABDOMINAL HYSTERECTOMY     APPENDECTOMY     CHOLECYSTECTOMY     HERNIA REPAIR     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)     TUBAL LIGATION     Now reversed   Patient Active Problem List   Diagnosis Date Noted   PVC's (premature ventricular contractions) 07/13/2017   Fatigue 07/13/2017   Hypertension 07/13/2017   Severe dysplasia of cervix 04/17/2015    PCP: Irven Coe, MD  REFERRING PROVIDER: Edwin Cap, DPM  REFERRING DIAG:  [phone] (ICD-10-CM) - Closed fracture of left ankle with routine healing, subsequent encounter M25.372 (ICD-10-CM) - Ankle instability, left  THERAPY DIAG:  Pain in left ankle and joints of left foot  Other abnormalities of gait and mobility  Muscle weakness (generalized)  Rationale for Evaluation and Treatment: Rehabilitation  ONSET DATE: 02/01/2023   SUBJECTIVE:  SUBJECTIVE STATEMENT: The ankle is still hurting when I walk. 3-4/10, annoying pain. Doesn't stop me.     PERTINENT HISTORY: HTN, Bariatric surgery  PAIN:  Are you having pain?  Yes: NPRS scale: 0/10 Worst: 8/10 Pain location: lateral L ankle Pain description: Aching, pulling Aggravating factors: walking, prolonged standing Relieving factors: rest, ice  PRECAUTIONS: None  WEIGHT BEARING RESTRICTIONS: Yes  WBAT in CAM boot  FALLS:  Has patient fallen in last 6 months? Yes. Number of falls - 4; first is the L ankle injury, 3 falls while using crutches  PATIENT GOALS: decrease pain in L ankle, improve ankle stability in order to get back to being active  NEXT MD VISIT: 03/29/2023   OBJECTIVE:  Note: Objective measures were completed at Evaluation unless otherwise noted. PATIENT SURVEYS:  FOTO: 47% function; 66% predicted FOTO 60% function 04/13/23   EDEMA:  DNT  POSTURE: weight shift right  PALPATION: TTP to L fibularis longus/brevis  LOWER EXTREMITY ROM:  Active ROM Right eval Left eval Left 03/08/2023 Left  03/22/23 Left 03/28/23 Left 04/13/23 Left  04/20/23 Left  04/26/23  Hip flexion          Hip extension          Hip abduction          Hip adduction          Hip internal rotation          Hip external rotation          Knee flexion          Knee extension          Ankle dorsiflexion 10 0 5 6 10  Prom 4 A / 10 P  5 A/p 10 A 7  Ankle plantarflexion   45  Ankle inversion   30 30      Ankle eversion   5 15       (Blank rows = not tested)  LOWER EXTREMITY MMT:  MMT Right eval Left eval Left 03/22/23 Left 04/13/23  Hip flexion      Hip extension      Hip abduction      Hip adduction      Hip internal rotation      Hip external rotation      Knee flexion      Knee extension      Ankle dorsiflexion 5 2+ 4-   Ankle plantarflexion 4 3    Ankle inversion 5 3  4   Ankle eversion 5 3  4  p   (Blank rows = not tested)  LOWER EXTREMITY SPECIAL TESTS:  DNT  FUNCTIONAL TESTS:  Five Time Sit to Stand: 20 seconds with UE 04/13/23: 5 x STS: 18.8 sec without UE  GAIT: Distance walked: 58ft Assistive device utilized: Crutches and L CAM boot Level of assistance: Modified independence Comments: decreased gait speed, R lean   TREATMENT: [facility] Adult PT Treatment:                                                DATE: 04/26/23 Therapeutic Exercise: Rec  Bike L2 x 5 min Bilat heel raise x 10 Bilat conentric and left eccentric lower Tandem stance 60 sec with LLE back AIREX 3/4 tandem LLE back  AIREX Tandem 35 sec LLE back , >30 RLE back  L SLS 15 sec best after multiple tries, R 60 sec  A/P blue rocker without UE Lateral weight shifting on blue rocker  Standing gastroc and soleue stretches  Standing toe raises x 10  Seated Blue Band DF  Supine Blue Band DF    [facility] Adult PT Treatment:                                                DATE: 04/20/23 Therapeutic Exercise: Rec Bike L2 x 5 min Bilat heel raise x 10 Bilat conentric and left eccentric lower  Bilat toe raises 3/4 tandem with head turns  Tandem stance 30 sec with LLE  Left  SLS 9 sec  AIREX normal with head turns  AIREX [place] with head turns  AIREX 3/4 tandem , each A/P blue rocker board.  Seated DF with blue band  Supine DF with double red band     [facility] Adult PT Treatment:                                                DATE: 04/13/23 Therapeutic Exercise: Rec Bike L2 x 5 minutes  Standing heel raise x 10 + HEP Standing toe raise x 10 + HEP Standing Gastroc stretch + HEP Standing Soleus stretch SLS 2 sec Left Tandem Stance 4 sec LLE back 3/4 Tandem 10 sec LLE back +HEP Supine Green band DF x 15  Modalities: Declined    [facility] Adult PT Treatment:  DATE: 03/28/23 Therapeutic Exercise:  Rec Bike L2 x 5 minutes  Seated heel raises 20 x 2  Seated toe raises 20 x 2  Seated BAPS L3 fwd/bwd,  lateral taps, circles  2 x 10 each Seated DF, IN, EV Green band x 20 each ; PF Blue x 20  DF Stretch and soleus stretch with towel, sitting 30 sec x 2 each  Mat SLR 3# 1 x 12,  3 way    [facility] Adult PT Treatment:                                                DATE: 03/25/23 Therapeutic Exercise: Rec Bike L1 x 5 minutes  Seated heel raises 20 x 2  Seated toe raises 20 x 2  Seated BAPS L3 fwd/bwd,  lateral taps, circles  2 x  10 each Seated DF, IN, EV red band x 20 each ; PF Green x 20  DF Stretch and soleus stretch with towel, sitting 30 sec x 2 each  Mat SLR 2# 1 x 15,  3 way       PATIENT EDUCATION:  Education details: HEP update Person educated: Patient Education method: Explanation, Demonstration, and Handouts Education comprehension: verbalized understanding and returned demonstration  HOME EXERCISE PROGRAM: Access Code: [email or web address]   ASSESSMENT: CLINICAL IMPRESSION: Pt arrives with reports of continued sliding/popping with ROM and walking, located at lateral ankle. Working on her HEP. Today she demonstrates improved hold times for Tandem and SLS compared to last week and tolerates progression on unlevel surface. Her DF AROM remains less than her PROM however is improved since last visit. Continued to work on DF strengthening in open and closed chain. Pt will follow up with MD end of January with potential for MRI if she continues to have painful popping/ sliding sensation in ankle.She will be re-evaluated next session to consider extending POC until MD follow up.  Patient would benefit from continued skilled PT to progress her ankle mobility and strength in order to reduce pain and maximize functional ability.    OBJECTIVE IMPAIRMENTS: Abnormal gait, decreased activity tolerance, decreased balance, decreased mobility, difficulty walking, decreased ROM, decreased strength, and pain  ACTIVITY LIMITATIONS: standing, squatting, stairs, transfers, and locomotion level  PARTICIPATION LIMITATIONS: driving, shopping, community activity, occupation, and yard work  PERSONAL FACTORS: Time since onset of injury/illness/exacerbation and 1-2 comorbidities: HTN, Bariatric surgery  are also affecting patient's functional outcome.    GOALS: Goals reviewed with patient? No  SHORT TERM GOALS: Target date: 03/25/2023   Pt will be compliant and knowledgeable with initial HEP for improved comfort and  carryover Baseline: initial HEP given  Goal status: MET  2.  Pt will self report left ankle pain no greater than 5/10 for improved comfort and functional ability Baseline: 7/10 at worst 03/25/23: 3/10 at worst Goal status: MET   LONG TERM GOALS: Target date: 04/29/2023   Pt will improve FOTO function score to no less than 66% as proxy for functional improvement with home ADLs and community activities Baseline: 47% function 04/13/23: 60% Goal status: MET   2.  Pt will self report left ankle pain no greater than 2/10 for improved comfort and functional ability Baseline: 7/10 at worst Goal status: ONGOING   3.  Pt will decrease Five Time Sit to Stand time to no less than 15 seconds for improved balance,  strength, and functional mobility Baseline: 20 seconds with UE 04/13/23: 18.8 sec  Goal status: ONGOING    4.  Pt will improve L ankle DF ROM to no less than 10 degrees for improved ankle mobility and gait Baseline: 0 degrees 04/26/23: 7 deg Goal status: ONGOING  5.  Pt will be able to ambulate approximately one mile with LRAD and no increase in ankle pain for improved comfort and functional mobility and get back to higher level activities Baseline: unable 04/26/23: 3-4/10 pain with ambulation Goal status: ONGOING   PLAN: PT FREQUENCY: 1-2x/week  PT DURATION: 8 weeks  PLANNED INTERVENTIONS: 97164- PT Re-evaluation, 97110-Therapeutic exercises, 97530- Therapeutic activity, 97112- Neuromuscular re-education, 97535- Self Care, [id number]- Manual therapy, [id number]- Gait training, [id number]- Aquatic Therapy, 97014- Electrical stimulation (unattended), [id number]- Electrical stimulation (manual), 97016- Vasopneumatic device, Dry Needling, Cryotherapy, and Moist heat  PLAN FOR NEXT SESSION: progress PRN    Jannette Spanner, PTA 04/26/23 9:35 AM Phone: [phone] Fax: [phone]

## 2023-05-03 ENCOUNTER — Other Ambulatory Visit: Payer: Self-pay

## 2023-05-03 ENCOUNTER — Ambulatory Visit: Payer: BC Managed Care – PPO | Admitting: Physical Therapy

## 2023-05-03 ENCOUNTER — Encounter: Payer: Self-pay | Admitting: Physical Therapy

## 2023-05-03 DIAGNOSIS — M25572 Pain in left ankle and joints of left foot: Secondary | ICD-10-CM | POA: Diagnosis not present

## 2023-05-03 DIAGNOSIS — M6281 Muscle weakness (generalized): Secondary | ICD-10-CM

## 2023-05-03 DIAGNOSIS — R2689 Other abnormalities of gait and mobility: Secondary | ICD-10-CM

## 2023-05-03 NOTE — Therapy (Signed)
 OUTPATIENT PHYSICAL THERAPY TREATMENT   Patient Name: Lisa Mosley MRN: [id number] DOB:08-25-1975, 47 y.o., female Today's Date: 05/03/2023   END OF SESSION:  PT End of Session - 05/03/23 0824     Visit Number 10    Number of Visits 17    Date for PT Re-Evaluation 06/14/23    Authorization Type BCBS    PT Start Time 0800    PT Stop Time 0840    PT Time Calculation (min) 40 min    Activity Tolerance Patient tolerated treatment well    Behavior During Therapy WFL for tasks assessed/performed               Past Medical History:  Diagnosis Date   HTN (hypertension)    Migraines    Nephrolithiasis    PVC (premature ventricular contraction)    Stomach tumor (benign)    Past Surgical History:  Procedure Laterality Date   ABDOMINAL HYSTERECTOMY     APPENDECTOMY     CHOLECYSTECTOMY     HERNIA REPAIR     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)     TUBAL LIGATION     Now reversed   Patient Active Problem List   Diagnosis Date Noted   PVC's (premature ventricular contractions) 07/13/2017   Fatigue 07/13/2017   Hypertension 07/13/2017   Severe dysplasia of cervix 04/17/2015    PCP: Leonel Cole, MD  REFERRING PROVIDER: Silva Juliene SAUNDERS, DPM  REFERRING DIAG:  [phone] (ICD-10-CM) - Closed fracture of left ankle with routine healing, subsequent encounter M25.372 (ICD-10-CM) - Ankle instability, left  THERAPY DIAG:  Pain in left ankle and joints of left foot  Other abnormalities of gait and mobility  Muscle weakness (generalized)  Rationale for Evaluation and Treatment: Rehabilitation  ONSET DATE: 02/01/2023   SUBJECTIVE:  SUBJECTIVE STATEMENT: Patient reports ankle is fine but she is getting tired of it hurting still. Pain still located on the outside and back of the ankle.   PERTINENT HISTORY: HTN, Bariatric surgery  PAIN:  Are you having pain?  Yes: NPRS scale: 3/10 Worst: 8/10 Pain location: lateral L ankle Pain  description: Aching, pulling Aggravating factors: walking, prolonged standing Relieving factors: rest, ice  PRECAUTIONS: None  WEIGHT BEARING RESTRICTIONS: Yes WBAT in CAM boot  FALLS:  Has patient fallen in last 6 months? Yes. Number of falls - 4; first is the L ankle injury, 3 falls while using crutches  PATIENT GOALS: decrease pain in L ankle, improve ankle stability in order to get back to being active   OBJECTIVE:  Note: Objective measures were completed at Evaluation unless otherwise noted. PATIENT SURVEYS:  FOTO: 47% function; 66% predicted FOTO 60% function 04/13/23  EDEMA:  DNT  POSTURE: weight shift right  PALPATION: TTP to L fibularis longus/brevis  LOWER EXTREMITY ROM:  Active ROM Right eval Left eval Left 03/08/2023 Left  03/22/23 Left 03/28/23 Left 04/13/23 Left  04/20/23 Left  04/26/23  Hip flexion          Hip extension          Hip abduction          Hip adduction          Hip internal rotation          Hip external rotation          Knee flexion          Knee extension          Ankle dorsiflexion 10 0 5 6 10  Prom 4 A / 10 P  5 A/p 10 A 7  Ankle plantarflexion   45       Ankle inversion   30 30      Ankle eversion   5 15       (Blank rows = not tested)  LOWER EXTREMITY MMT:  MMT Right eval Left eval Left 03/22/23 Left 04/13/23 Left 05/03/2023  Hip flexion       Hip extension       Hip abduction       Hip adduction       Hip internal rotation       Hip external rotation       Knee flexion       Knee extension       Ankle dorsiflexion 5 2+ 4-  5  Ankle plantarflexion 4 3   4   Ankle inversion 5 3  4  4+  Ankle eversion 5 3  4  p 4   (Blank rows = not tested)  LOWER EXTREMITY SPECIAL TESTS:  DNT  FUNCTIONAL TESTS:  Five Time Sit to Stand: 20 seconds with UE 04/13/23: 5 x STS: 18.8 sec without UE  GAIT: Distance walked: 66ft Assistive device utilized: Crutches and L CAM boot Level of assistance: Modified  independence Comments: decreased gait speed, R lean   TREATMENT: [facility] Adult PT Treatment:                                                DATE: 05/03/23 Therapeutic Exercise: Seated BAPS L4 frontal and sagittal plane taps x 20 each Longsitting ankle inversion with green 2 x 20, eversion with green 3 x 10 Longsitting ankle PF / DF with blue 2 x 20 Seated heel raise with 25# 2 x 20 Standing heel raise 3 x 15 Tandem stance on Airex 3 x 30 sec each SL stance with forward cone tap 2 x 10   [facility] Adult PT Treatment:                                                DATE: 04/26/23 Therapeutic Exercise: Rec Bike L2 x 5 min Bilat heel raise x 10 Bilat conentric and left eccentric lower Tandem stance 60 sec with LLE back AIREX 3/4 tandem LLE back  AIREX Tandem 35 sec LLE back , >30 RLE back  L SLS 15 sec best after multiple tries, R 60 sec  A/P blue rocker without UE Lateral weight shifting on blue rocker  Standing gastroc and soleue stretches  Standing toe raises x 10  Seated Blue Band DF  Supine Blue Band DF  [facility] Adult PT Treatment:                                                DATE: 04/20/23 Therapeutic Exercise: Rec Bike L2 x 5 min Bilat heel raise x 10 Bilat conentric and left eccentric lower  Bilat toe raises 3/4 tandem with head turns  Tandem stance 30 sec with LLE  Left  SLS 9 sec  AIREX normal with head turns  [employer] with  head turns  AIREX 3/4 tandem , each A/P blue rocker board.  Seated DF with blue band  Supine DF with double red band   [facility] Adult PT Treatment:                                                DATE: 04/13/23 Therapeutic Exercise: Rec Bike L2 x 5 minutes  Standing heel raise x 10 + HEP Standing toe raise x 10 + HEP Standing Gastroc stretch + HEP Standing Soleus stretch SLS 2 sec Left Tandem Stance 4 sec LLE back 3/4 Tandem 10 sec LLE back +HEP Supine Green band DF x 15  Modalities: Declined  PATIENT EDUCATION:  Education details: POC  extension, HEP Person educated: Patient Education method: Programmer, Multimedia, Demonstration Education comprehension: verbalized understanding and returned demonstration  HOME EXERCISE PROGRAM: Access Code: [email or web address]   ASSESSMENT: CLINICAL IMPRESSION: Patient tolerated therapy well with no adverse effects. Therapy focused on progressing her left ankle strength and control with good tolerance. She does continue to exhibit gross strength deficits of the left ankle but has improvement since last assessed, and she continues to report lateral ankle pain with walking and activity. She reports pain primarily with eversion based movements and weight bearing in therapy. No changes made to HEP this visit. Patient would benefit from continued skilled PT to progress her ankle mobility and strength in order to reduce pain and maximize functional ability, so will extend her PT POC for 6 more weeks.     OBJECTIVE IMPAIRMENTS: Abnormal gait, decreased activity tolerance, decreased balance, decreased mobility, difficulty walking, decreased ROM, decreased strength, and pain  ACTIVITY LIMITATIONS: standing, squatting, stairs, transfers, and locomotion level  PARTICIPATION LIMITATIONS: driving, shopping, community activity, occupation, and yard work  PERSONAL FACTORS: Time since onset of injury/illness/exacerbation and 1-2 comorbidities: HTN, Bariatric surgery  are also affecting patient's functional outcome.    GOALS: Goals reviewed with patient? No  SHORT TERM GOALS: Target date: 03/25/2023   Pt will be compliant and knowledgeable with initial HEP for improved comfort and carryover Baseline: initial HEP given  Goal status: MET  2.  Pt will self report left ankle pain no greater than 5/10 for improved comfort and functional ability Baseline: 7/10 at worst 03/25/23: 3/10 at worst Goal status: MET   LONG TERM GOALS: Target date: 06/14/2023   Pt will improve FOTO function score to no less than 66% as proxy  for functional improvement with home ADLs and community activities Baseline: 47% function 04/13/23: 60% Goal status: ONGOING  2.  Pt will self report left ankle pain no greater than 2/10 for improved comfort and functional ability Baseline: 7/10 at worst 05/03/2023: patient continues to report pain with walking Goal status: ONGOING   3.  Pt will decrease Five Time Sit to Stand time to no less than 15 seconds for improved balance, strength, and functional mobility Baseline: 20 seconds with UE 04/13/23: 18.8 sec  Goal status: ONGOING    4.  Pt will improve L ankle DF ROM to no less than 10 degrees for improved ankle mobility and gait Baseline: 0 degrees 04/26/23: 7 deg Goal status: ONGOING  5.  Pt will be able to ambulate approximately one mile with LRAD and no increase in ankle pain for improved comfort and functional mobility and get back to higher level activities Baseline: unable 04/26/23: 3-4/10 pain with ambulation  05/03/2023: patient continues to report pain with walking using SPC Goal status: ONGOING   PLAN: PT FREQUENCY: 1-2x/week  PT DURATION: 6 weeks  PLANNED INTERVENTIONS: 97164- PT Re-evaluation, 97110-Therapeutic exercises, 97530- Therapeutic activity, 97112- Neuromuscular re-education, 97535- Self Care, [id number]- Manual therapy, [id number]- Gait training, [phone]- Aquatic Therapy, 97014- Electrical stimulation (unattended), [id number]- Electrical stimulation (manual), 97016- Vasopneumatic device, Dry Needling, Cryotherapy, and Moist heat  PLAN FOR NEXT SESSION: progress ankle mobility and strengthening, ankle stability and control    Elaine Daring, PT, DPT, LAT, ATC 05/03/23  10:47 AM Phone: [phone] Fax: [phone]

## 2023-05-09 ENCOUNTER — Ambulatory Visit: Payer: 59 | Attending: Podiatry | Admitting: Physical Therapy

## 2023-05-09 ENCOUNTER — Encounter: Payer: Self-pay | Admitting: Physical Therapy

## 2023-05-09 DIAGNOSIS — M6281 Muscle weakness (generalized): Secondary | ICD-10-CM

## 2023-05-09 DIAGNOSIS — R2689 Other abnormalities of gait and mobility: Secondary | ICD-10-CM

## 2023-05-09 DIAGNOSIS — M25572 Pain in left ankle and joints of left foot: Secondary | ICD-10-CM

## 2023-05-09 NOTE — Therapy (Signed)
 OUTPATIENT PHYSICAL THERAPY TREATMENT   Patient Name: Lisa Mosley MRN: [id number] DOB:1975/09/06, 48 y.o., female Today's Date: 05/09/2023   END OF SESSION:  PT End of Session - 05/09/23 1104     Visit Number 11    Number of Visits 17    Date for PT Re-Evaluation 06/14/23    Authorization Type BCBS    PT Start Time 1102    PT Stop Time 1145    PT Time Calculation (min) 43 min               Past Medical History:  Diagnosis Date   HTN (hypertension)    Migraines    Nephrolithiasis    PVC (premature ventricular contraction)    Stomach tumor (benign)    Past Surgical History:  Procedure Laterality Date   ABDOMINAL HYSTERECTOMY     APPENDECTOMY     CHOLECYSTECTOMY     HERNIA REPAIR     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)     TUBAL LIGATION     Now reversed   Patient Active Problem List   Diagnosis Date Noted   PVC's (premature ventricular contractions) 07/13/2017   Fatigue 07/13/2017   Hypertension 07/13/2017   Severe dysplasia of cervix 04/17/2015    PCP: Leonel Cole, MD  REFERRING PROVIDER: Silva Juliene SAUNDERS, DPM  REFERRING DIAG:  [phone] (ICD-10-CM) - Closed fracture of left ankle with routine healing, subsequent encounter M25.372 (ICD-10-CM) - Ankle instability, left  THERAPY DIAG:  Pain in left ankle and joints of left foot  Other abnormalities of gait and mobility  Muscle weakness (generalized)  Rationale for Evaluation and Treatment: Rehabilitation  ONSET DATE: 02/01/2023   SUBJECTIVE:  SUBJECTIVE STATEMENT: Patient reports ankle is fine but she is getting tired of it hurting still. Pain still located on the outside and back of the ankle.   PERTINENT HISTORY: HTN, Bariatric surgery  PAIN:  Are you having pain?  Yes: NPRS scale: 3/10 Worst: 8/10 Pain location: lateral L ankle Pain description: Aching, pulling Aggravating factors: walking, prolonged standing Relieving factors: rest,  ice  PRECAUTIONS: None  WEIGHT BEARING RESTRICTIONS: Yes WBAT in CAM boot  FALLS:  Has patient fallen in last 6 months? Yes. Number of falls - 4; first is the L ankle injury, 3 falls while using crutches  PATIENT GOALS: decrease pain in L ankle, improve ankle stability in order to get back to being active   OBJECTIVE:  Note: Objective measures were completed at Evaluation unless otherwise noted. PATIENT SURVEYS:  FOTO: 47% function; 66% predicted FOTO 60% function 04/13/23  EDEMA:  DNT  POSTURE: weight shift right  PALPATION: TTP to L fibularis longus/brevis  LOWER EXTREMITY ROM:  Active ROM Right eval Left eval Left 03/08/2023 Left  03/22/23 Left 03/28/23 Left 04/13/23 Left  04/20/23 Left  04/26/23  Hip flexion          Hip extension          Hip abduction          Hip adduction          Hip internal rotation          Hip external rotation          Knee flexion          Knee extension          Ankle dorsiflexion 10 0 5 6 10  Prom 4 A / 10 P  5 A/p 10 A 7  Ankle plantarflexion   45  Ankle inversion   30 30      Ankle eversion   5 15       (Blank rows = not tested)  LOWER EXTREMITY MMT:  MMT Right eval Left eval Left 03/22/23 Left 04/13/23 Left 05/03/2023  Hip flexion       Hip extension       Hip abduction       Hip adduction       Hip internal rotation       Hip external rotation       Knee flexion       Knee extension       Ankle dorsiflexion 5 2+ 4-  5  Ankle plantarflexion 4 3   4   Ankle inversion 5 3  4  4+  Ankle eversion 5 3  4  p 4   (Blank rows = not tested)  LOWER EXTREMITY SPECIAL TESTS:  DNT  FUNCTIONAL TESTS:  Five Time Sit to Stand: 20 seconds with UE 04/13/23: 5 x STS: 18.8 sec without UE  GAIT: Distance walked: 74ft Assistive device utilized: Crutches and L CAM boot Level of assistance: Modified independence Comments: decreased gait speed, R lean   TREATMENT: [facility] Adult PT Treatment:                                                 DATE: 05/09/23 Therapeutic Exercise: Rec Bike L2 x 5 minutes  Bilat heel raise x 10 Bilat conentric and left eccentric lower SLS AIREX 30 sec TANDEM AIREX > 30 sec  Wooden rocker without UE Standing toe raises  Standing gastroc stretch  SLS with ABC each side  Slant board stretch AROM Df measured 10 degrees left Supine GTB 10 x 2 DF, EV, INV     [facility] Adult PT Treatment:                                                DATE: 05/03/23 Therapeutic Exercise: Seated BAPS L4 frontal and sagittal plane taps x 20 each Longsitting ankle inversion with green 2 x 20, eversion with green 3 x 10 Longsitting ankle PF / DF with blue 2 x 20 Seated heel raise with 25# 2 x 20 Standing heel raise 3 x 15 Tandem stance on Airex 3 x 30 sec each SL stance with forward cone tap 2 x 10   [facility] Adult PT Treatment:                                                DATE: 04/26/23 Therapeutic Exercise: Rec Bike L2 x 5 min Bilat heel raise x 10 Bilat conentric and left eccentric lower Tandem stance 60 sec with LLE back AIREX 3/4 tandem LLE back  AIREX Tandem 35 sec LLE back , >30 RLE back  L SLS 15 sec best after multiple tries, R 60 sec  A/P blue rocker without UE Lateral weight shifting on blue rocker  Standing gastroc and soleue stretches  Standing toe raises x 10  Seated Blue Band DF  Supine Blue Band DF  [facility] Adult PT Treatment:  DATE: 04/20/23 Therapeutic Exercise: Rec Bike L2 x 5 min Bilat heel raise x 10 Bilat conentric and left eccentric lower  Bilat toe raises 3/4 tandem with head turns  Tandem stance 30 sec with LLE  Left  SLS 9 sec  AIREX normal with head turns  AIREX Rhomberg with head turns  AIREX 3/4 tandem , each A/P blue rocker board.  Seated DF with blue band  Supine DF with double red band   [facility] Adult PT Treatment:                                                DATE: 04/13/23 Therapeutic Exercise: Rec Bike L2  x 5 minutes  Standing heel raise x 10 + HEP Standing toe raise x 10 + HEP Standing Gastroc stretch + HEP Standing Soleus stretch SLS 2 sec Left Tandem Stance 4 sec LLE back 3/4 Tandem 10 sec LLE back +HEP Supine Green band DF x 15  Modalities: Declined  PATIENT EDUCATION:  Education details: POC extension, HEP Person educated: Patient Education method: Programmer, Multimedia, Demonstration Education comprehension: verbalized understanding and returned demonstration  HOME EXERCISE PROGRAM: Access Code: [email or web address]   ASSESSMENT: CLINICAL IMPRESSION: Pt reports less episodes of sliding and popping but does have lateral ankle pain with prolonged activity. She does demonstrate improved static SLS time on unlevel surface and able to begin dynamic SLS on a level surface. Her active supine DF reached 10 degrees today, LTG# 4 met.  Patient would benefit from continued skilled PT to progress her ankle mobility and strength in order to reduce pain and maximize functional ability.    OBJECTIVE IMPAIRMENTS: Abnormal gait, decreased activity tolerance, decreased balance, decreased mobility, difficulty walking, decreased ROM, decreased strength, and pain  ACTIVITY LIMITATIONS: standing, squatting, stairs, transfers, and locomotion level  PARTICIPATION LIMITATIONS: driving, shopping, community activity, occupation, and yard work  PERSONAL FACTORS: Time since onset of injury/illness/exacerbation and 1-2 comorbidities: HTN, Bariatric surgery  are also affecting patient's functional outcome.    GOALS: Goals reviewed with patient? No  SHORT TERM GOALS: Target date: 03/25/2023   Pt will be compliant and knowledgeable with initial HEP for improved comfort and carryover Baseline: initial HEP given  Goal status: MET  2.  Pt will self report left ankle pain no greater than 5/10 for improved comfort and functional ability Baseline: 7/10 at worst 03/25/23: 3/10 at worst Goal status: MET   LONG TERM GOALS:  Target date: 06/14/2023   Pt will improve FOTO function score to no less than 66% as proxy for functional improvement with home ADLs and community activities Baseline: 47% function 04/13/23: 60% Goal status: ONGOING  2.  Pt will self report left ankle pain no greater than 2/10 for improved comfort and functional ability Baseline: 7/10 at worst 05/03/2023: patient continues to report pain with walking Goal status: ONGOING   3.  Pt will decrease Five Time Sit to Stand time to no less than 15 seconds for improved balance, strength, and functional mobility Baseline: 20 seconds with UE 04/13/23: 18.8 sec  Goal status: ONGOING    4.  Pt will improve L ankle DF ROM to no less than 10 degrees for improved ankle mobility and gait Baseline: 0 degrees 04/26/23: 7 deg 05/09/23: 10 deg Goal status: MET   5.  Pt will be able to ambulate approximately one mile with LRAD and no  increase in ankle pain for improved comfort and functional mobility and get back to higher level activities Baseline: unable 04/26/23: 3-4/10 pain with ambulation 05/03/2023: patient continues to report pain with walking using SPC Goal status: ONGOING   PLAN: PT FREQUENCY: 1-2x/week  PT DURATION: 6 weeks  PLANNED INTERVENTIONS: 97164- PT Re-evaluation, 97110-Therapeutic exercises, 97530- Therapeutic activity, 97112- Neuromuscular re-education, 97535- Self Care, [id number]- Manual therapy, [id number]- Gait training, [id number]- Aquatic Therapy, 97014- Electrical stimulation (unattended), [id number]- Electrical stimulation (manual), 97016- Vasopneumatic device, Dry Needling, Cryotherapy, and Moist heat  PLAN FOR NEXT SESSION: progress ankle mobility and strengthening, ankle stability and control    Harlene Persons, PTA 05/09/23 12:51 PM Phone: [phone] Fax: [phone]

## 2023-05-18 NOTE — Therapy (Signed)
OUTPATIENT PHYSICAL THERAPY TREATMENT   Patient Name: Lisa Mosley MRN: [id number] DOB:[birth year], 48 y.o., female Today's Date: 05/19/2023   END OF SESSION:  PT End of Session - 05/19/23 0802     Visit Number 12    Number of Visits 17    Date for PT Re-Evaluation 06/14/23    Authorization Type BCBS    PT Start Time 0801    PT Stop Time 0845    PT Time Calculation (min) 44 min    Activity Tolerance Patient tolerated treatment well    Behavior During Therapy WFL for tasks assessed/performed                Past Medical History:  Diagnosis Date   HTN (hypertension)    Migraines    Nephrolithiasis    PVC (premature ventricular contraction)    Stomach tumor (benign)    Past Surgical History:  Procedure Laterality Date   ABDOMINAL HYSTERECTOMY     APPENDECTOMY     CHOLECYSTECTOMY     HERNIA REPAIR     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)     TUBAL LIGATION     Now reversed   Patient Active Problem List   Diagnosis Date Noted   PVC's (premature ventricular contractions) 07/13/2017   Fatigue 07/13/2017   Hypertension 07/13/2017   Severe dysplasia of cervix 04/17/2015    PCP: Irven Coe, MD  REFERRING PROVIDER: Edwin Cap, DPM  REFERRING DIAG:  [phone] (ICD-10-CM) - Closed fracture of left ankle with routine healing, subsequent encounter M25.372 (ICD-10-CM) - Ankle instability, left  THERAPY DIAG:  Pain in left ankle and joints of left foot  Other abnormalities of gait and mobility  Muscle weakness (generalized)  Rationale for Evaluation and Treatment: Rehabilitation  ONSET DATE: 02/01/2023   SUBJECTIVE:  SUBJECTIVE STATEMENT: Patient reports her ankle has been bothering her a little more with walking on the snow. Pain is on the inside and outside of the ankle.  PERTINENT HISTORY: HTN, Bariatric surgery  PAIN:  Are you having pain?  Yes: NPRS scale: 5/10 Worst: 8/10 Pain location: lateral L  ankle Pain description: Aching, pulling Aggravating factors: walking, prolonged standing Relieving factors: rest, ice  PRECAUTIONS: None  WEIGHT BEARING RESTRICTIONS: Yes WBAT in CAM boot  FALLS:  Has patient fallen in last 6 months? Yes. Number of falls - 4; first is the L ankle injury, 3 falls while using crutches  PATIENT GOALS: decrease pain in L ankle, improve ankle stability in order to get back to being active   OBJECTIVE:  Note: Objective measures were completed at Evaluation unless otherwise noted. PATIENT SURVEYS:  FOTO: 47% function; 66% predicted FOTO 60% function 04/13/23  EDEMA:  DNT  POSTURE: weight shift right  PALPATION: TTP to L fibularis longus/brevis  LOWER EXTREMITY ROM:  Active ROM Right eval Left eval Left 03/08/2023 Left  03/22/23 Left 03/28/23 Left 04/13/23 Left  04/20/23 Left  04/26/23  Hip flexion          Hip extension          Hip abduction          Hip adduction          Hip internal rotation          Hip external rotation          Knee flexion          Knee extension          Ankle dorsiflexion 10 0 5 6  10 Prom 4 A / 10 P  5 A/p 10 A 7  Ankle plantarflexion   45       Ankle inversion   30 30      Ankle eversion   5 15       (Blank rows = not tested)  LOWER EXTREMITY MMT:  MMT Right eval Left eval Left 03/22/23 Left 04/13/23 Left 05/03/2023  Hip flexion       Hip extension       Hip abduction       Hip adduction       Hip internal rotation       Hip external rotation       Knee flexion       Knee extension       Ankle dorsiflexion 5 2+ 4-  5  Ankle plantarflexion 4 3   4   Ankle inversion 5 3  4  4+  Ankle eversion 5 3  4  p 4   (Blank rows = not tested)  LOWER EXTREMITY SPECIAL TESTS:  DNT  FUNCTIONAL TESTS:  Five Time Sit to Stand: 20 seconds with UE 04/13/23: 5 x STS: 18.8 sec without UE  GAIT: Distance walked: 23ft Assistive device utilized: Crutches and L CAM boot Level of assistance: Modified  independence Comments: decreased gait speed, R lean   TREATMENT: [facility] Adult PT Treatment:                                                DATE: 05/19/2023 Therapeutic Exercise: Seated BAPS L4 frontal and sagittal plane taps x 20 each, circle cw/ccw x 10 each Slant board calf stretch 3 x 30 sec Longsitting ankle eversion with blue 2 x 20 Longsitting ankle PF with blue 2 x 20 Seated heel raise with 25# 2 x 20 Manual: Skilled palpation and monitoring of muscle tension while performing TPDN IASTM using roller to left calf and peroneal region  Trigger Point Dry Needling  Initial Treatment: Pt instructed on Dry Needling rational, procedures, and possible side effects. Pt instructed to expect mild to moderate muscle soreness later in the day and/or into the next day.  Pt instructed in methods to reduce muscle soreness. Pt instructed to continue prescribed HEP. Because Dry Needling was performed over or adjacent to a lung field, pt was educated on S/S of pneumothorax and to seek immediate medical attention should they occur.  Patient was educated on signs and symptoms of infection and other risk factors and advised to seek medical attention should they occur.  Patient verbalized understanding of these instructions and education.   Patient Verbal Consent Given: Yes Education Handout Provided: Yes Muscles Treated: Lateral gastroc, soleus, peroneals Electrical Stimulation Performed: No Treatment Response/Outcome: Twitch response   [facility] Adult PT Treatment:                                                DATE: 05/09/23 Therapeutic Exercise: Rec Bike L2 x 5 minutes  Bilat heel raise x 10 Bilat conentric and left eccentric lower SLS AIREX 30 sec TANDEM AIREX > 30 sec  Wooden rocker without UE Standing toe raises  Standing gastroc stretch  SLS with ABC each side  Slant board stretch AROM Df measured  10 degrees left Supine GTB 10 x 2 DF, EV, INV  [facility] Adult PT Treatment:                                                 DATE: 05/03/23 Therapeutic Exercise: Seated BAPS L4 frontal and sagittal plane taps x 20 each Longsitting ankle inversion with green 2 x 20, eversion with green 3 x 10 Longsitting ankle PF / DF with blue 2 x 20 Seated heel raise with 25# 2 x 20 Standing heel raise 3 x 15 Tandem stance on Airex 3 x 30 sec each SL stance with forward cone tap 2 x 10  [facility] Adult PT Treatment:                                                DATE: 04/26/23 Therapeutic Exercise: Rec Bike L2 x 5 min Bilat heel raise x 10 Bilat conentric and left eccentric lower Tandem stance 60 sec with LLE back AIREX 3/4 tandem LLE back  AIREX Tandem 35 sec LLE back , >30 RLE back  L SLS 15 sec best after multiple tries, R 60 sec  A/P blue rocker without UE Lateral weight shifting on blue rocker  Standing gastroc and soleue stretches  Standing toe raises x 10  Seated Blue Band DF  Supine Blue Band DF  [facility] Adult PT Treatment:                                                DATE: 04/20/23 Therapeutic Exercise: Rec Bike L2 x 5 min Bilat heel raise x 10 Bilat conentric and left eccentric lower  Bilat toe raises 3/4 tandem with head turns  Tandem stance 30 sec with LLE  Left  SLS 9 sec  AIREX normal with head turns  AIREX Rhomberg with head turns  AIREX 3/4 tandem , each A/P blue rocker board.  Seated DF with blue band  Supine DF with double red band   PATIENT EDUCATION:  Education details: HEP Person educated: Patient Education method: Programmer, multimedia, Demonstration Education comprehension: verbalized understanding and returned demonstration  HOME EXERCISE PROGRAM: Access Code: [email or web address]   ASSESSMENT: CLINICAL IMPRESSION: Patient tolerated therapy well with no adverse effects. She arrived reporting increased medial and lateral ankle pain. She did exhibit tenderness and palpable trigger points to left lateral gastroc, soleus, and peroneal region so performed TPDN with multiple twitch  responses elicited. Therapy continues to progress ankle strengthening and control with good tolerance. No increase in pain noted with therapy. Updated her HEP for patient to perform left calf/peroneal SMFR using tennis ball for home. Patient would benefit from continued skilled PT to progress her ankle mobility and strength in order to reduce pain and maximize functional ability.    OBJECTIVE IMPAIRMENTS: Abnormal gait, decreased activity tolerance, decreased balance, decreased mobility, difficulty walking, decreased ROM, decreased strength, and pain  ACTIVITY LIMITATIONS: standing, squatting, stairs, transfers, and locomotion level  PARTICIPATION LIMITATIONS: driving, shopping, community activity, occupation, and yard work  PERSONAL FACTORS: Time since onset of injury/illness/exacerbation and 1-2 comorbidities: HTN, Bariatric surgery  are also affecting  patient's functional outcome.    GOALS: Goals reviewed with patient? No  SHORT TERM GOALS: Target date: 03/25/2023   Pt will be compliant and knowledgeable with initial HEP for improved comfort and carryover Baseline: initial HEP given  Goal status: MET  2.  Pt will self report left ankle pain no greater than 5/10 for improved comfort and functional ability Baseline: 7/10 at worst 03/25/23: 3/10 at worst Goal status: MET   LONG TERM GOALS: Target date: 06/14/2023   Pt will improve FOTO function score to no less than 66% as proxy for functional improvement with home ADLs and community activities Baseline: 47% function 04/13/23: 60% Goal status: ONGOING  2.  Pt will self report left ankle pain no greater than 2/10 for improved comfort and functional ability Baseline: 7/10 at worst 05/03/2023: patient continues to report pain with walking Goal status: ONGOING   3.  Pt will decrease Five Time Sit to Stand time to no less than 15 seconds for improved balance, strength, and functional mobility Baseline: 20 seconds with UE 04/13/23:  18.8 sec  Goal status: ONGOING    4.  Pt will improve L ankle DF ROM to no less than 10 degrees for improved ankle mobility and gait Baseline: 0 degrees 04/26/23: 7 deg 05/09/23: 10 deg Goal status: MET   5.  Pt will be able to ambulate approximately one mile with LRAD and no increase in ankle pain for improved comfort and functional mobility and get back to higher level activities Baseline: unable 04/26/23: 3-4/10 pain with ambulation 05/03/2023: patient continues to report pain with walking using SPC Goal status: ONGOING   PLAN: PT FREQUENCY: 1-2x/week  PT DURATION: 6 weeks  PLANNED INTERVENTIONS: 97164- PT Re-evaluation, 97110-Therapeutic exercises, 97530- Therapeutic activity, 97112- Neuromuscular re-education, 97535- Self Care, 16109- Manual therapy, [id number]- Gait training, [id number]- Aquatic Therapy, 97014- Electrical stimulation (unattended), [id number]- Electrical stimulation (manual), 97016- Vasopneumatic device, Dry Needling, Cryotherapy, and Moist heat  PLAN FOR NEXT SESSION: progress ankle mobility and strengthening, ankle stability and control    Rosana Hoes, PT, DPT, LAT, ATC 05/19/23  9:04 AM Phone: [phone] Fax: [phone]

## 2023-05-19 ENCOUNTER — Encounter: Payer: Self-pay | Admitting: Physical Therapy

## 2023-05-19 ENCOUNTER — Other Ambulatory Visit: Payer: Self-pay

## 2023-05-19 ENCOUNTER — Ambulatory Visit: Payer: 59 | Admitting: Physical Therapy

## 2023-05-19 DIAGNOSIS — M25572 Pain in left ankle and joints of left foot: Secondary | ICD-10-CM | POA: Diagnosis not present

## 2023-05-19 DIAGNOSIS — R2689 Other abnormalities of gait and mobility: Secondary | ICD-10-CM

## 2023-05-19 DIAGNOSIS — M6281 Muscle weakness (generalized): Secondary | ICD-10-CM

## 2023-05-19 NOTE — Patient Instructions (Addendum)
Access Code: [email or web address] URL: [email or web address] Date: 05/19/2023 Prepared by: Rosana Hoes  Exercises - Supine Ankle Pumps  - 2 x daily - 7 x weekly - 2 sets - 20 reps - Long Sitting Calf Stretch with Strap  - 2 x daily - 7 x weekly - 2-3 reps - 30 sec hold - Ankle Inversion Eversion Towel Slide  - 2 x daily - 7 x weekly - 2 sets - 10 reps - Seated Heel Toe Raises  - 1 x daily - 3 sets - 20 reps - Long Sitting Ankle Plantar Flexion with Resistance  - 1 x daily - 3 sets - 15 reps - Long Sitting Ankle Eversion with Resistance  - 1 x daily - 3 sets - 10 reps - Seated Ankle Dorsiflexion with Resistance  - 1 x daily - 7 x weekly - 3 sets - 10 reps - Standing Gastroc Stretch  - 1 x daily - 7 x weekly - 3 sets - 10 reps - Standing Romberg to 3/4 Tandem Stance  - 1 x daily - 7 x weekly - 1 sets - 5 reps - 20-30 hold - Heel Toe Raises with Counter Support  - 1 x daily - 7 x weekly - 2 sets - 10 reps - Standing Tandem Balance with Counter Support  - 1 x daily - 7 x weekly - 1 sets - 3 reps - 30 hold - Standing Single Leg Stance with Counter Support  - 1 x daily - 7 x weekly - 1 sets - 3 reps - 30 hold - Calf Mobilization with Small Ball on Yoga Block  - 3-5 minutes hold   Trigger Point Dry Needling  What is Trigger Point Dry Needling (DN)? DN is a physical therapy technique used to treat muscle pain and dysfunction. Specifically, DN helps deactivate muscle trigger points (muscle knots).  A thin filiform needle is used to penetrate the skin and stimulate the underlying trigger point. The goal is for a local twitch response (LTR) to occur and for the trigger point to relax. No medication of any kind is injected during the procedure.   What Does Trigger Point Dry Needling Feel Like?  The procedure feels different for each individual patient. Some patients report that they do not actually feel the needle enter the skin and overall the process is not painful. Very mild bleeding may  occur. However, many patients feel a deep cramping in the muscle in which the needle was inserted. This is the local twitch response.   How Will I feel after the treatment? Soreness is normal, and the onset of soreness may not occur for a few hours. Typically this soreness does not last longer than two days.  Bruising is uncommon, however; ice can be used to decrease any possible bruising.  In rare cases feeling tired or nauseous after the treatment is normal. In addition, your symptoms may get worse before they get better, this period will typically not last longer than 24 hours.   What Can I do After My Treatment? Increase your hydration by drinking more water for the next 24 hours.  You may place ice or heat on the areas treated that have become sore, however, do not use heat on inflamed or bruised areas. Heat often brings more relief post needling. You can continue your regular activities, but vigorous activity is not recommended initially after the treatment for 24 hours. DN is best combined with other physical therapy such as strengthening, stretching, and other therapies.  What are the complications? While your therapist has had extensive training in minimizing the risks of trigger point dry needling, it is important to understand the risks of any procedure.  Risks include bleeding, pain, fatigue, hematoma, infection, vertigo, nausea or nerve involvement. Monitor for any changes to your skin or sensation. Contact your therapist or MD with concerns.  A rare but serious complication is a pneumothorax over or near your middle and upper chest and back If you have dry needling in this area, monitor for the following symptoms: Shortness of breath on exertion and/or Difficulty taking a deep breath and/or Chest Pain and/or A dry cough If any of the above symptoms develop, please go to the nearest emergency room or call 911. Tell them you had dry needling over your thorax and report any symptoms  you are having. Please follow-up with your treating therapist after you complete the medical evaluation.

## 2023-05-23 ENCOUNTER — Encounter: Payer: Self-pay | Admitting: Physical Therapy

## 2023-05-23 ENCOUNTER — Ambulatory Visit: Payer: 59 | Admitting: Physical Therapy

## 2023-05-23 DIAGNOSIS — M25572 Pain in left ankle and joints of left foot: Secondary | ICD-10-CM | POA: Diagnosis not present

## 2023-05-23 DIAGNOSIS — R2689 Other abnormalities of gait and mobility: Secondary | ICD-10-CM

## 2023-05-23 DIAGNOSIS — M6281 Muscle weakness (generalized): Secondary | ICD-10-CM

## 2023-05-23 NOTE — Therapy (Signed)
OUTPATIENT PHYSICAL THERAPY TREATMENT   Patient Name: Lisa Mosley MRN: [id number] DOB:[birth year], 48 y.o., female Today's Date: 05/23/2023   END OF SESSION:  PT End of Session - 05/23/23 0804     Visit Number 13    Number of Visits 17    Date for PT Re-Evaluation 06/14/23    Authorization Type BCBS    PT Start Time 0802    PT Stop Time 0845    PT Time Calculation (min) 43 min                Past Medical History:  Diagnosis Date   HTN (hypertension)    Migraines    Nephrolithiasis    PVC (premature ventricular contraction)    Stomach tumor (benign)    Past Surgical History:  Procedure Laterality Date   ABDOMINAL HYSTERECTOMY     APPENDECTOMY     CHOLECYSTECTOMY     HERNIA REPAIR     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)     TUBAL LIGATION     Now reversed   Patient Active Problem List   Diagnosis Date Noted   PVC's (premature ventricular contractions) 07/13/2017   Fatigue 07/13/2017   Hypertension 07/13/2017   Severe dysplasia of cervix 04/17/2015    PCP: Irven Coe, MD  REFERRING PROVIDER: Edwin Cap, DPM  REFERRING DIAG:  [phone] (ICD-10-CM) - Closed fracture of left ankle with routine healing, subsequent encounter M25.372 (ICD-10-CM) - Ankle instability, left  THERAPY DIAG:  Pain in left ankle and joints of left foot  Other abnormalities of gait and mobility  Muscle weakness (generalized)  Rationale for Evaluation and Treatment: Rehabilitation  ONSET DATE: 02/01/2023   SUBJECTIVE:  SUBJECTIVE STATEMENT: Patient reports reduced pain after TPDN. Pain not waking me up at night like last week. See MD tomorrow. Still have the popping but it is more intermittent. I feel stronger but still have pain and instability.      PERTINENT HISTORY: HTN, Bariatric surgery  PAIN:  Are you having pain?  Yes: NPRS scale: 3-4/10 Worst: 8/10 Pain location: lateral L ankle Pain description: Aching,  pulling Aggravating factors: walking, prolonged standing Relieving factors: rest, ice  PRECAUTIONS: None  WEIGHT BEARING RESTRICTIONS: Yes WBAT in CAM boot  FALLS:  Has patient fallen in last 6 months? Yes. Number of falls - 4; first is the L ankle injury, 3 falls while using crutches  PATIENT GOALS: decrease pain in L ankle, improve ankle stability in order to get back to being active   OBJECTIVE:  Note: Objective measures were completed at Evaluation unless otherwise noted. PATIENT SURVEYS:  FOTO: 47% function; 66% predicted FOTO 60% function 04/13/23 FOTO 67% function 05/23/23  EDEMA:  DNT  POSTURE: weight shift right  PALPATION: TTP to L fibularis longus/brevis  LOWER EXTREMITY ROM:  Active ROM Right eval Left eval Left 03/08/2023 Left  03/22/23 Left 03/28/23 Left 04/13/23 Left  04/20/23 Left  04/26/23  Hip flexion          Hip extension          Hip abduction          Hip adduction          Hip internal rotation          Hip external rotation          Knee flexion          Knee extension          Ankle dorsiflexion 10 0 5 6  10 Prom 4 A / 10 P  5 A/p 10 A 7  Ankle plantarflexion   45       Ankle inversion   30 30      Ankle eversion   5 15       (Blank rows = not tested)  LOWER EXTREMITY MMT:  MMT Right eval Left eval Left 03/22/23 Left 04/13/23 Left 05/03/2023  Hip flexion       Hip extension       Hip abduction       Hip adduction       Hip internal rotation       Hip external rotation       Knee flexion       Knee extension       Ankle dorsiflexion 5 2+ 4-  5  Ankle plantarflexion 4 3   4   Ankle inversion 5 3  4  4+  Ankle eversion 5 3  4  p 4   (Blank rows = not tested)  LOWER EXTREMITY SPECIAL TESTS:  DNT  FUNCTIONAL TESTS:  Five Time Sit to Stand: 20 seconds with UE 04/13/23: 5 x STS: 18.8 sec without UE  GAIT: Distance walked: 23ft Assistive device utilized: Crutches and L CAM boot Level of assistance: Modified  independence Comments: decreased gait speed, R lean   TREATMENT: [facility] Adult PT Treatment:                                                DATE: 05/23/23 Therapeutic Exercise: Rec Bike L2 x 5 minutes  Standing heel raises Standing toe raises  Blue rocker  SLS on AIREX 9-10 sec best  SLS with ABC Seated Baps L4     [facility] Adult PT Treatment:                                                DATE: 05/19/2023 Therapeutic Exercise: Seated BAPS L4 frontal and sagittal plane taps x 20 each, circle cw/ccw x 10 each Slant board calf stretch 3 x 30 sec Longsitting ankle eversion with blue 2 x 20 Longsitting ankle PF with blue 2 x 20 Seated heel raise with 25# 2 x 20 Manual: Skilled palpation and monitoring of muscle tension while performing TPDN IASTM using roller to left calf and peroneal region  Trigger Point Dry Needling  Initial Treatment: Pt instructed on Dry Needling rational, procedures, and possible side effects. Pt instructed to expect mild to moderate muscle soreness later in the day and/or into the next day.  Pt instructed in methods to reduce muscle soreness. Pt instructed to continue prescribed HEP. Because Dry Needling was performed over or adjacent to a lung field, pt was educated on S/S of pneumothorax and to seek immediate medical attention should they occur.  Patient was educated on signs and symptoms of infection and other risk factors and advised to seek medical attention should they occur.  Patient verbalized understanding of these instructions and education.   Patient Verbal Consent Given: Yes Education Handout Provided: Yes Muscles Treated: Lateral gastroc, soleus, peroneals Electrical Stimulation Performed: No Treatment Response/Outcome: Twitch response   [facility] Adult PT Treatment:  DATE: 05/09/23 Therapeutic Exercise: Rec Bike L2 x 5 minutes  Bilat heel raise x 10 Bilat conentric and left eccentric lower SLS AIREX 30  sec TANDEM AIREX > 30 sec  Wooden rocker without UE Standing toe raises  Standing gastroc stretch  SLS with ABC each side  Slant board stretch AROM Df measured 10 degrees left Supine GTB 10 x 2 DF, EV, INV  [facility] Adult PT Treatment:                                                DATE: 05/03/23 Therapeutic Exercise: Seated BAPS L4 frontal and sagittal plane taps x 20 each Longsitting ankle inversion with green 2 x 20, eversion with green 3 x 10 Longsitting ankle PF / DF with blue 2 x 20 Seated heel raise with 25# 2 x 20 Standing heel raise 3 x 15 Tandem stance on Airex 3 x 30 sec each SL stance with forward cone tap 2 x 10   PATIENT EDUCATION:  Education details: HEP Person educated: Patient Education method: Programmer, multimedia, Demonstration Education comprehension: verbalized understanding and returned demonstration  HOME EXERCISE PROGRAM: Access Code: [email or web address]   ASSESSMENT: CLINICAL IMPRESSION: Patient tolerated therapy well with no adverse effects. She arrived reporting decreased medial and lateral ankle pain since TPDN. She has returned to base line pain of 3-4/10. Her FOTO score has improved to prediction, LTG# 1 MET. 5 x STS improved to target, LTG# 3 met. Continued with dynamic balance and ankle strength. She does have constant 3-4/10 pain with closed chain activity and intermittent popping in ankle. She sees MD for F/U tomorrow and may have an MRI in the future to assess ligaments. She reports feeling stronger overall but still with pain and feeling of instability.  Patient would benefit from continued skilled PT to progress her ankle mobility and strength in order to reduce pain and maximize functional ability.    OBJECTIVE IMPAIRMENTS: Abnormal gait, decreased activity tolerance, decreased balance, decreased mobility, difficulty walking, decreased ROM, decreased strength, and pain  ACTIVITY LIMITATIONS: standing, squatting, stairs, transfers, and locomotion  level  PARTICIPATION LIMITATIONS: driving, shopping, community activity, occupation, and yard work  PERSONAL FACTORS: Time since onset of injury/illness/exacerbation and 1-2 comorbidities: HTN, Bariatric surgery  are also affecting patient's functional outcome.    GOALS: Goals reviewed with patient? No  SHORT TERM GOALS: Target date: 03/25/2023   Pt will be compliant and knowledgeable with initial HEP for improved comfort and carryover Baseline: initial HEP given  Goal status: MET  2.  Pt will self report left ankle pain no greater than 5/10 for improved comfort and functional ability Baseline: 7/10 at worst 03/25/23: 3/10 at worst Goal status: MET   LONG TERM GOALS: Target date: 06/14/2023   Pt will improve FOTO function score to no less than 66% as proxy for functional improvement with home ADLs and community activities Baseline: 47% function 04/13/23: 60% 05/23/23: 67% Goal status: MET  2.  Pt will self report left ankle pain no greater than 2/10 for improved comfort and functional ability Baseline: 7/10 at worst 05/03/2023: patient continues to report pain with walking  05/23/23: 3-4/10 Goal status: ONGOING   3.  Pt will decrease Five Time Sit to Stand time to no less than 15 seconds for improved balance, strength, and functional mobility Baseline: 20 seconds with UE 04/13/23: 18.8 sec  05/23/23:  05/23/23 Goal status: MET   4.  Pt will improve L ankle DF ROM to no less than 10 degrees for improved ankle mobility and gait Baseline: 0 degrees 04/26/23: 7 deg 05/09/23: 10 deg Goal status: MET   5.  Pt will be able to ambulate approximately one mile with LRAD and no increase in ankle pain for improved comfort and functional mobility and get back to higher level activities Baseline: unable 04/26/23: 3-4/10 pain with ambulation 05/03/2023: patient continues to report pain with walking using [facility] 05/23/23: 3-4/10 with walking 1 mile  Goal status: ONGOING   PLAN: PT  FREQUENCY: 1-2x/week  PT DURATION: 6 weeks  PLANNED INTERVENTIONS: 97164- PT Re-evaluation, 97110-Therapeutic exercises, 97530- Therapeutic activity, 97112- Neuromuscular re-education, 97535- Self Care, 62130- Manual therapy, [id number]- Gait training, [id number]- Aquatic Therapy, 97014- Electrical stimulation (unattended), [id number]- Electrical stimulation (manual), 97016- Vasopneumatic device, Dry Needling, Cryotherapy, and Moist heat  PLAN FOR NEXT SESSION: progress ankle mobility and strengthening, ankle stability and control , what did MD say?    Jannette Spanner, PTA 05/23/23 9:59 AM Phone: [phone] Fax: [phone]

## 2023-05-24 ENCOUNTER — Ambulatory Visit: Payer: BC Managed Care – PPO | Admitting: Podiatry

## 2023-06-01 ENCOUNTER — Encounter: Payer: Self-pay | Admitting: Physical Therapy

## 2023-06-01 ENCOUNTER — Ambulatory Visit: Payer: 59 | Admitting: Physical Therapy

## 2023-06-01 DIAGNOSIS — R2689 Other abnormalities of gait and mobility: Secondary | ICD-10-CM

## 2023-06-01 DIAGNOSIS — M25572 Pain in left ankle and joints of left foot: Secondary | ICD-10-CM

## 2023-06-01 NOTE — Therapy (Signed)
OUTPATIENT PHYSICAL THERAPY TREATMENT   Patient Name: Lisa Mosley MRN: [id number] DOB:11-14-1975, 48 y.o., female Today's Date: 06/01/2023   END OF SESSION:  PT End of Session - 06/01/23 0721     Visit Number 14    Number of Visits 17    Date for PT Re-Evaluation 06/14/23    Authorization Type BCBS    PT Start Time 0719    PT Stop Time 0757    PT Time Calculation (min) 38 min                Past Medical History:  Diagnosis Date   HTN (hypertension)    Migraines    Nephrolithiasis    PVC (premature ventricular contraction)    Stomach tumor (benign)    Past Surgical History:  Procedure Laterality Date   ABDOMINAL HYSTERECTOMY     APPENDECTOMY     CHOLECYSTECTOMY     HERNIA REPAIR     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)     TUBAL LIGATION     Now reversed   Patient Active Problem List   Diagnosis Date Noted   PVC's (premature ventricular contractions) 07/13/2017   Fatigue 07/13/2017   Hypertension 07/13/2017   Severe dysplasia of cervix 04/17/2015    PCP: Irven Coe, MD  REFERRING PROVIDER: Edwin Cap, DPM  REFERRING DIAG:  [phone] (ICD-10-CM) - Closed fracture of left ankle with routine healing, subsequent encounter M25.372 (ICD-10-CM) - Ankle instability, left  THERAPY DIAG:  Pain in left ankle and joints of left foot  Other abnormalities of gait and mobility  Rationale for Evaluation and Treatment: Rehabilitation  ONSET DATE: 02/01/2023   SUBJECTIVE:  SUBJECTIVE STATEMENT: Patient reports reduced pain after TPDN. Pain not waking me up at night like last week. See MD tomorrow. Still have the popping but it is more intermittent. I feel stronger but still have pain and instability.      PERTINENT HISTORY: HTN, Bariatric surgery  PAIN:  Are you having pain?  Yes: NPRS scale: 3-4/10 Worst: 8/10 Pain location: lateral L ankle Pain description: Aching, pulling Aggravating factors: walking,  prolonged standing Relieving factors: rest, ice  PRECAUTIONS: None  WEIGHT BEARING RESTRICTIONS: Yes WBAT in CAM boot  FALLS:  Has patient fallen in last 6 months? Yes. Number of falls - 4; first is the L ankle injury, 3 falls while using crutches  PATIENT GOALS: decrease pain in L ankle, improve ankle stability in order to get back to being active   OBJECTIVE:  Note: Objective measures were completed at Evaluation unless otherwise noted. PATIENT SURVEYS:  FOTO: 47% function; 66% predicted FOTO 60% function 04/13/23 FOTO 67% function 05/23/23  EDEMA:  DNT  POSTURE: weight shift right  PALPATION: TTP to L fibularis longus/brevis  LOWER EXTREMITY ROM:  Active ROM Right eval Left eval Left 03/08/2023 Left  03/22/23 Left 03/28/23 Left 04/13/23 Left  04/20/23 Left  04/26/23  Hip flexion          Hip extension          Hip abduction          Hip adduction          Hip internal rotation          Hip external rotation          Knee flexion          Knee extension          Ankle dorsiflexion 10 0 5 6 10  Prom 4 A /  10 P  5 A/p 10 A 7  Ankle plantarflexion   45       Ankle inversion   30 30      Ankle eversion   5 15       (Blank rows = not tested)  LOWER EXTREMITY MMT:  MMT Right eval Left eval Left 03/22/23 Left 04/13/23 Left 05/03/2023  Hip flexion       Hip extension       Hip abduction       Hip adduction       Hip internal rotation       Hip external rotation       Knee flexion       Knee extension       Ankle dorsiflexion 5 2+ 4-  5  Ankle plantarflexion 4 3   4   Ankle inversion 5 3  4  4+  Ankle eversion 5 3  4  p 4   (Blank rows = not tested)  LOWER EXTREMITY SPECIAL TESTS:  DNT  FUNCTIONAL TESTS:  Five Time Sit to Stand: 20 seconds with UE 04/13/23: 5 x STS: 18.8 sec without UE  GAIT: Distance walked: 64ft Assistive device utilized: Crutches and L CAM boot Level of assistance: Modified independence Comments: decreased gait speed, R  lean      [facility] Adult PT Treatment:                                                DATE: 06/01/23 Therapeutic Exercise: Rec Bike L2 x 5 min Standing heel raises x 20 Standing toe raises x 20  TANDEM AIREX Tandem forward gait TANDEM AIREX side stepping  Tandem stance on AIREX SLS with ABC draw using physioball Hurdle stepping forward alternating Hurdle stepping lateral Alternating step taps from [employer] step ups with 1 UE support Bosu balance on dome bilat, intermittent touch  prn     [facility] Adult PT Treatment:                                                DATE: 05/23/23 Therapeutic Exercise: Rec Bike L2 x 5 minutes  Standing heel raises Standing toe raises  Blue rocker  SLS on AIREX 9-10 sec best  SLS with ABC Seated Baps L4     [facility] Adult PT Treatment:                                                DATE: 05/19/2023 Therapeutic Exercise: Seated BAPS L4 frontal and sagittal plane taps x 20 each, circle cw/ccw x 10 each Slant board calf stretch 3 x 30 sec Longsitting ankle eversion with blue 2 x 20 Longsitting ankle PF with blue 2 x 20 Seated heel raise with 25# 2 x 20 Manual: Skilled palpation and monitoring of muscle tension while performing TPDN IASTM using roller to left calf and peroneal region  Trigger Point Dry Needling  Initial Treatment: Pt instructed on Dry Needling rational, procedures, and possible side effects. Pt instructed to expect mild to moderate muscle soreness later in the day and/or  into the next day.  Pt instructed in methods to reduce muscle soreness. Pt instructed to continue prescribed HEP. Because Dry Needling was performed over or adjacent to a lung field, pt was educated on S/S of pneumothorax and to seek immediate medical attention should they occur.  Patient was educated on signs and symptoms of infection and other risk factors and advised to seek medical attention should they occur.  Patient verbalized  understanding of these instructions and education.   Patient Verbal Consent Given: Yes Education Handout Provided: Yes Muscles Treated: Lateral gastroc, soleus, peroneals Electrical Stimulation Performed: No Treatment Response/Outcome: Twitch response   [facility] Adult PT Treatment:                                                DATE: 05/09/23 Therapeutic Exercise: Rec Bike L2 x 5 minutes  Bilat heel raise x 10 Bilat conentric and left eccentric lower SLS AIREX 30 sec TANDEM AIREX > 30 sec  Wooden rocker without UE Standing toe raises  Standing gastroc stretch  SLS with ABC each side  Slant board stretch AROM Df measured 10 degrees left Supine GTB 10 x 2 DF, EV, INV  [facility] Adult PT Treatment:                                                DATE: 05/03/23 Therapeutic Exercise: Seated BAPS L4 frontal and sagittal plane taps x 20 each Longsitting ankle inversion with green 2 x 20, eversion with green 3 x 10 Longsitting ankle PF / DF with blue 2 x 20 Seated heel raise with 25# 2 x 20 Standing heel raise 3 x 15 Tandem stance on Airex 3 x 30 sec each SL stance with forward cone tap 2 x 10   PATIENT EDUCATION:  Education details: HEP Person educated: Patient Education method: Programmer, multimedia, Demonstration Education comprehension: verbalized understanding and returned demonstration  HOME EXERCISE PROGRAM: Access Code: [email or web address]   ASSESSMENT: CLINICAL IMPRESSION: Patient tolerated therapy well with no adverse effects. She arrived reporting decreased ankle pain since last week however continued constant 2-3/10 pain with closed chain activity and intermittent popping in ankle. Pain increases after 1/2 mile of ambulation. Pt would like to return to 5K run/walks. She reports feeling stronger overall but still with pain and feeling of instability. MD rescheduled her appointment out 2 more weeks. Will continue with POC 1 x week until F/U and reassess. Potential for MRI to assess ligaments. Pt  tolerated increased dynamic balance and ankle stability challenges well with reports of popping in ankle.  Patient would benefit from continued skilled PT to progress her ankle mobility and strength in order to reduce pain and maximize functional ability.    OBJECTIVE IMPAIRMENTS: Abnormal gait, decreased activity tolerance, decreased balance, decreased mobility, difficulty walking, decreased ROM, decreased strength, and pain  ACTIVITY LIMITATIONS: standing, squatting, stairs, transfers, and locomotion level  PARTICIPATION LIMITATIONS: driving, shopping, community activity, occupation, and yard work  PERSONAL FACTORS: Time since onset of injury/illness/exacerbation and 1-2 comorbidities: HTN, Bariatric surgery  are also affecting patient's functional outcome.    GOALS: Goals reviewed with patient? No  SHORT TERM GOALS: Target date: 03/25/2023   Pt will be compliant and knowledgeable with initial HEP for improved  comfort and carryover Baseline: initial HEP given  Goal status: MET  2.  Pt will self report left ankle pain no greater than 5/10 for improved comfort and functional ability Baseline: 7/10 at worst 03/25/23: 3/10 at worst Goal status: MET   LONG TERM GOALS: Target date: 06/14/2023   Pt will improve FOTO function score to no less than 66% as proxy for functional improvement with home ADLs and community activities Baseline: 47% function 04/13/23: 60% 05/23/23: 67% Goal status: MET  2.  Pt will self report left ankle pain no greater than 2/10 for improved comfort and functional ability Baseline: 7/10 at worst 05/03/2023: patient continues to report pain with walking  05/23/23: 3-4/10 05/31/26: 2-3/10 Goal status: ONGOING   3.  Pt will decrease Five Time Sit to Stand time to no less than 15 seconds for improved balance, strength, and functional mobility Baseline: 20 seconds with UE 04/13/23: 18.8 sec  05/23/23: 05/23/23 Goal status: MET   4.  Pt will improve L ankle DF  ROM to no less than 10 degrees for improved ankle mobility and gait Baseline: 0 degrees 04/26/23: 7 deg 05/09/23: 10 deg Goal status: MET   5.  Pt will be able to ambulate approximately one mile with LRAD and no increase in ankle pain for improved comfort and functional mobility and get back to higher level activities Baseline: unable 04/26/23: 3-4/10 pain with ambulation 05/03/2023: patient continues to report pain with walking using [facility] 05/23/23: 3-4/10 with walking 1 mile  Goal status: ONGOING   PLAN: PT FREQUENCY: 1-2x/week  PT DURATION: 6 weeks  PLANNED INTERVENTIONS: 97164- PT Re-evaluation, 97110-Therapeutic exercises, 97530- Therapeutic activity, 97112- Neuromuscular re-education, 97535- Self Care, 19147- Manual therapy, [id number]- Gait training, [id number]- Aquatic Therapy, 97014- Electrical stimulation (unattended), [id number]- Electrical stimulation (manual), 97016- Vasopneumatic device, Dry Needling, Cryotherapy, and Moist heat  PLAN FOR NEXT SESSION: progress ankle mobility and strengthening, ankle stability and control , what did MD say? Re-eval in 2 visits    Jannette Spanner, PTA 06/01/23 1:53 PM Phone: [phone] Fax: [phone]

## 2023-06-06 ENCOUNTER — Ambulatory Visit: Payer: 59 | Attending: Podiatry | Admitting: Physical Therapy

## 2023-06-06 ENCOUNTER — Encounter: Payer: Self-pay | Admitting: Physical Therapy

## 2023-06-06 DIAGNOSIS — R2689 Other abnormalities of gait and mobility: Secondary | ICD-10-CM | POA: Diagnosis present

## 2023-06-06 DIAGNOSIS — M25572 Pain in left ankle and joints of left foot: Secondary | ICD-10-CM | POA: Diagnosis present

## 2023-06-06 DIAGNOSIS — M6281 Muscle weakness (generalized): Secondary | ICD-10-CM | POA: Diagnosis present

## 2023-06-06 NOTE — Therapy (Addendum)
 OUTPATIENT PHYSICAL THERAPY TREATMENT  DISCHARGE   Patient Name: Lisa Mosley MRN: [id number] DOB:[birth year], 48 y.o., female Today's Date: 06/06/2023   END OF SESSION:  PT End of Session - 06/06/23 0718     Visit Number 15    Number of Visits 17    Date for PT Re-Evaluation 06/14/23    Authorization Type [employer]    PT Start Time 0716    PT Stop Time 0754    PT Time Calculation (min) 38 min                Past Medical History:  Diagnosis Date   HTN (hypertension)    Migraines    Nephrolithiasis    PVC (premature ventricular contraction)    Stomach tumor (benign)    Past Surgical History:  Procedure Laterality Date   ABDOMINAL HYSTERECTOMY     APPENDECTOMY     CHOLECYSTECTOMY     HERNIA REPAIR     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)     TUBAL LIGATION     Now reversed   Patient Active Problem List   Diagnosis Date Noted   PVC's (premature ventricular contractions) 07/13/2017   Fatigue 07/13/2017   Hypertension 07/13/2017   Severe dysplasia of cervix 04/17/2015    PCP: Irven Coe, MD  REFERRING PROVIDER: Edwin Cap, DPM  REFERRING DIAG:  [phone] (ICD-10-CM) - Closed fracture of left ankle with routine healing, subsequent encounter M25.372 (ICD-10-CM) - Ankle instability, left  THERAPY DIAG:  Pain in left ankle and joints of left foot  Other abnormalities of gait and mobility  Muscle weakness (generalized)  Rationale for Evaluation and Treatment: Rehabilitation  ONSET DATE: 02/01/2023   SUBJECTIVE:  SUBJECTIVE STATEMENT: Patient reports her pain has increased to 4-5/10 with popping. Pain is more constant than last week. Pain increased on Thursday with unknown aggravator.       PERTINENT HISTORY: HTN, Bariatric surgery  PAIN:  Are you having pain?  Yes: NPRS scale: 4-5/10 Worst: 8/10 Pain location: media and lateral L ankle Pain description: Aching, pulling Aggravating factors: walking,  prolonged standing Relieving factors: rest, ice  PRECAUTIONS: None  WEIGHT BEARING RESTRICTIONS: Yes WBAT in CAM boot  FALLS:  Has patient fallen in last 6 months? Yes. Number of falls - 4; first is the L ankle injury, 3 falls while using crutches  PATIENT GOALS: decrease pain in L ankle, improve ankle stability in order to get back to being active   OBJECTIVE:  Note: Objective measures were completed at Evaluation unless otherwise noted. PATIENT SURVEYS:  FOTO: 47% function; 66% predicted FOTO 60% function 04/13/23 FOTO 67% function 05/23/23  EDEMA:  DNT  POSTURE: weight shift right  PALPATION: TTP to L fibularis longus/brevis  LOWER EXTREMITY ROM:  Active ROM Right eval Left eval Left 03/08/2023 Left  03/22/23 Left 03/28/23 Left 04/13/23 Left  04/20/23 Left  04/26/23 Left  06/06/23  Hip flexion           Hip extension           Hip abduction           Hip adduction           Hip internal rotation           Hip external rotation           Knee flexion           Knee extension           Ankle dorsiflexion 10  0 5 6 10  Prom 4 A / 10 P  5 A/p 10 A 7 A 10  Ankle plantarflexion   45        Ankle inversion   30 30       Ankle eversion   5 15        (Blank rows = not tested)  LOWER EXTREMITY MMT:  MMT Right eval Left eval Left 03/22/23 Left 04/13/23 Left 05/03/2023  Hip flexion       Hip extension       Hip abduction       Hip adduction       Hip internal rotation       Hip external rotation       Knee flexion       Knee extension       Ankle dorsiflexion 5 2+ 4-  5  Ankle plantarflexion 4 3   4   Ankle inversion 5 3  4  4+  Ankle eversion 5 3  4  p 4   (Blank rows = not tested)  LOWER EXTREMITY SPECIAL TESTS:  DNT  FUNCTIONAL TESTS:  Five Time Sit to Stand: 20 seconds with UE 04/13/23: 5 x STS: 18.8 sec without UE 05/06/23: SLS >60 sec left   GAIT: Distance walked: 89ft Assistive device utilized: Crutches and L CAM boot Level of assistance:  Modified independence Comments: decreased gait speed, R lean    [facility] Adult PT Treatment:                                                DATE: 06/06/23 Therapeutic Exercise: Rec Bike L2 x 5 minutes  Standing heel raises x 20 Standing toe raises x 20  Neuromuscular re-ed: Tandem stance on AIREX SLS with ABC draw using physioball SLS > 60 sec Left Therapeutic Activity: Bosu step ups with 1 UE support Bosu balance on dome bilat, intermittent touch  prn Bosu balance on Flat top wide, narrow, squats, weight shifting A/P all without UE    [facility] Adult PT Treatment:                                                DATE: 06/01/23 Therapeutic Exercise: Rec Bike L2 x 5 min Standing heel raises x 20 Standing toe raises x 20  TANDEM AIREX Tandem forward gait TANDEM AIREX side stepping  Tandem stance on AIREX SLS with ABC draw using physioball Hurdle stepping forward alternating Hurdle stepping lateral Alternating step taps from [employer] step ups with 1 UE support Bosu balance on dome bilat, intermittent touch  prn     [facility] Adult PT Treatment:                                                DATE: 05/23/23 Therapeutic Exercise: Rec Bike L2 x 5 minutes  Standing heel raises Standing toe raises  Blue rocker  SLS on AIREX 9-10 sec best  SLS with ABC Seated Baps L4     [facility] Adult PT Treatment:  DATE: 05/19/2023 Therapeutic Exercise: Seated BAPS L4 frontal and sagittal plane taps x 20 each, circle cw/ccw x 10 each Slant board calf stretch 3 x 30 sec Longsitting ankle eversion with blue 2 x 20 Longsitting ankle PF with blue 2 x 20 Seated heel raise with 25# 2 x 20 Manual: Skilled palpation and monitoring of muscle tension while performing TPDN IASTM using roller to left calf and peroneal region  Trigger Point Dry Needling  Initial Treatment: Pt instructed on Dry Needling rational, procedures, and possible side  effects. Pt instructed to expect mild to moderate muscle soreness later in the day and/or into the next day.  Pt instructed in methods to reduce muscle soreness. Pt instructed to continue prescribed HEP. Because Dry Needling was performed over or adjacent to a lung field, pt was educated on S/S of pneumothorax and to seek immediate medical attention should they occur.  Patient was educated on signs and symptoms of infection and other risk factors and advised to seek medical attention should they occur.  Patient verbalized understanding of these instructions and education.   Patient Verbal Consent Given: Yes Education Handout Provided: Yes Muscles Treated: Lateral gastroc, soleus, peroneals Electrical Stimulation Performed: No Treatment Response/Outcome: Twitch response   [facility] Adult PT Treatment:                                                DATE: 05/09/23 Therapeutic Exercise: Rec Bike L2 x 5 minutes  Bilat heel raise x 10 Bilat conentric and left eccentric lower SLS AIREX 30 sec TANDEM AIREX > 30 sec  Wooden rocker without UE Standing toe raises  Standing gastroc stretch  SLS with ABC each side  Slant board stretch AROM Df measured 10 degrees left Supine GTB 10 x 2 DF, EV, INV     PATIENT EDUCATION:  Education details: HEP Person educated: Patient Education method: Programmer, multimedia, Demonstration Education comprehension: verbalized understanding and returned demonstration  HOME EXERCISE PROGRAM: Access Code: [email or web address]   ASSESSMENT: CLINICAL IMPRESSION: 06/06/23: Pt arrives reporting increased constant pain rated at 4-5/10. She is able to complete all prescribed therex. Continued to work on balance and ankle stability to progress toward patient goal of returning to prolonged walking and running for exercise.     06/01/23: Patient tolerated therapy well with no adverse effects. She arrived reporting decreased ankle pain since last week however continued constant 2-3/10 pain with  closed chain activity and intermittent popping in ankle. Pain increases after 1/2 mile of ambulation. Pt would like to return to 5K run/walks. She reports feeling stronger overall but still with pain and feeling of instability. MD rescheduled her appointment out 2 more weeks. Will continue with POC 1 x week until F/U and reassess. Potential for MRI to assess ligaments. Pt tolerated increased dynamic balance and ankle stability challenges well with reports of popping in ankle.  Patient would benefit from continued skilled PT to progress her ankle mobility and strength in order to reduce pain and maximize functional ability.    OBJECTIVE IMPAIRMENTS: Abnormal gait, decreased activity tolerance, decreased balance, decreased mobility, difficulty walking, decreased ROM, decreased strength, and pain  ACTIVITY LIMITATIONS: standing, squatting, stairs, transfers, and locomotion level  PARTICIPATION LIMITATIONS: driving, shopping, community activity, occupation, and yard work  PERSONAL FACTORS: Time since onset of injury/illness/exacerbation and 1-2 comorbidities: HTN, Bariatric surgery  are also affecting patient's functional outcome.  GOALS: Goals reviewed with patient? No  SHORT TERM GOALS: Target date: 03/25/2023   Pt will be compliant and knowledgeable with initial HEP for improved comfort and carryover Baseline: initial HEP given  Goal status: MET  2.  Pt will self report left ankle pain no greater than 5/10 for improved comfort and functional ability Baseline: 7/10 at worst 03/25/23: 3/10 at worst Goal status: MET   LONG TERM GOALS: Target date: 06/14/2023   Pt will improve FOTO function score to no less than 66% as proxy for functional improvement with home ADLs and community activities Baseline: 47% function 04/13/23: 60% 05/23/23: 67% Goal status: MET  2.  Pt will self report left ankle pain no greater than 2/10 for improved comfort and functional ability Baseline: 7/10 at  worst 05/03/2023: patient continues to report pain with walking  05/23/23: 3-4/10 05/31/26: 2-3/10 Goal status: ONGOING   3.  Pt will decrease Five Time Sit to Stand time to no less than 15 seconds for improved balance, strength, and functional mobility Baseline: 20 seconds with UE 04/13/23: 18.8 sec  05/23/23: 05/23/23 Goal status: MET   4.  Pt will improve L ankle DF ROM to no less than 10 degrees for improved ankle mobility and gait Baseline: 0 degrees 04/26/23: 7 deg 05/09/23: 10 deg Goal status: MET   5.  Pt will be able to ambulate approximately one mile with LRAD and no increase in ankle pain for improved comfort and functional mobility and get back to higher level activities Baseline: unable 04/26/23: 3-4/10 pain with ambulation 05/03/2023: patient continues to report pain with walking using [facility] 05/23/23: 3-4/10 with walking 1 mile  Goal status: ONGOING   PLAN: PT FREQUENCY: 1-2x/week  PT DURATION: 6 weeks  PLANNED INTERVENTIONS: 97164- PT Re-evaluation, 97110-Therapeutic exercises, 97530- Therapeutic activity, 97112- Neuromuscular re-education, 97535- Self Care, 96045- Manual therapy, [id number]- Gait training, [id number]- Aquatic Therapy, 97014- Electrical stimulation (unattended), [id number]- Electrical stimulation (manual), 97016- Vasopneumatic device, Dry Needling, Cryotherapy, and Moist heat  PLAN FOR NEXT SESSION: progress ankle mobility and strengthening, ankle stability and control , what did MD say? Re-eval in 1 visits    Jannette Spanner, [place] 06/06/23 9:29 AM Phone: [phone] Fax: [phone]    PHYSICAL THERAPY DISCHARGE SUMMARY  Visits from Start of Care: 15  Current functional level related to goals / functional outcomes: See above   Remaining deficits: See above   Education / Equipment: HEP   Patient agrees to discharge. Patient goals were not met. Patient is being discharged due to not returning since the last visit.  Rosana Hoes, PT, DPT, LAT,  ATC 08/10/23  9:01 AM Phone: [phone] Fax: [phone]

## 2023-06-14 ENCOUNTER — Ambulatory Visit (INDEPENDENT_AMBULATORY_CARE_PROVIDER_SITE_OTHER): Payer: Self-pay

## 2023-06-14 ENCOUNTER — Other Ambulatory Visit: Payer: Self-pay | Admitting: Podiatry

## 2023-06-14 ENCOUNTER — Ambulatory Visit: Payer: 59 | Admitting: Podiatry

## 2023-06-14 ENCOUNTER — Encounter: Payer: Self-pay | Admitting: Podiatry

## 2023-06-14 DIAGNOSIS — M25372 Other instability, left ankle: Secondary | ICD-10-CM | POA: Diagnosis not present

## 2023-06-14 DIAGNOSIS — S82892D Other fracture of left lower leg, subsequent encounter for closed fracture with routine healing: Secondary | ICD-10-CM

## 2023-06-14 NOTE — Patient Instructions (Signed)

## 2023-06-14 NOTE — Progress Notes (Signed)
  Subjective:  Patient ID: Lisa Mosley, female    DOB: August 28, 1975,  MRN: [id number]  Chief Complaint  Patient presents with   Routine Post Op    Patient states she is still having pain since last visit, patient takes ibuprofen for pain      History of Present Illness   She returns for follow-up feels like it has gone backwards and pain has worsened, feels like she has hit a plateau with therapy, pain is in the front and inside of the ankle      Objective:    Physical Exam   EXTREMITIES: Today she has no pain on the distal fibula directly she does have pain over the ATFL and syndesmosis and AITFL.      No images are attached to the encounter.    Results   RADIOLOGY Left ankle x-ray: New radiographs taken today show complete healing of fibular fracture, medial clear space has remained intact, there is less tib-fib overlap  Assessment:   Encounter Diagnoses  Name Primary?   Closed fracture of left ankle with routine healing, subsequent encounter Yes   Ankle instability, left       Plan:  Patient was evaluated and treated and all questions answered.  Assessment and Plan    Left Ankle Fracture   Feels like she has worsened and ankle is hurting her and limiting her from reaching full function.  At this point considering the nature of her injury and the extended recovery that is has taken I recommended evaluating with an MRI to evaluate the soft tissue ligaments laterally and the syndesmosis the most painful area for her is over the lower anterior syndesmosis and AITFL.  Surgical intervention and repair may be indicated pending results of the MRI.  For now she may continue activity as tolerated and therapy as tolerated.  She will follow with me after the MRI once it is scheduled. No follow-ups on file.

## 2023-06-15 ENCOUNTER — Ambulatory Visit: Payer: 59 | Admitting: Physical Therapy

## 2023-06-16 ENCOUNTER — Ambulatory Visit
Admission: RE | Admit: 2023-06-16 | Discharge: 2023-06-16 | Disposition: A | Discharge status: Home / Self Care | Payer: 59 | Source: Ambulatory Visit | Attending: Podiatry | Admitting: Podiatry

## 2023-06-16 ENCOUNTER — Ambulatory Visit: Payer: 59 | Admitting: Physical Therapy

## 2023-06-16 DIAGNOSIS — S82892D Other fracture of left lower leg, subsequent encounter for closed fracture with routine healing: Secondary | ICD-10-CM

## 2023-06-16 DIAGNOSIS — M25372 Other instability, left ankle: Secondary | ICD-10-CM

## 2023-06-30 ENCOUNTER — Encounter: Payer: Self-pay | Admitting: Podiatry

## 2023-06-30 ENCOUNTER — Ambulatory Visit: Payer: 59 | Admitting: Podiatry

## 2023-06-30 DIAGNOSIS — M25372 Other instability, left ankle: Secondary | ICD-10-CM | POA: Diagnosis not present

## 2023-06-30 DIAGNOSIS — M25872 Other specified joint disorders, left ankle and foot: Secondary | ICD-10-CM

## 2023-06-30 DIAGNOSIS — S82892S Other fracture of left lower leg, sequela: Secondary | ICD-10-CM | POA: Diagnosis not present

## 2023-06-30 NOTE — Progress Notes (Signed)
 Subjective:  Patient ID: Lisa Mosley, female    DOB: 09-22-1975,  MRN: [id number]  Chief Complaint  Patient presents with   Closed fracture of left ankle with routine healing, subsequ    Pt stated that she is still having some pain she is here to go over her mri results       History of Present Illness   She returns for follow-up still having quite a bit of pain in the ankle.  She completed the MRI.      Objective:    Physical Exam   EXTREMITIES: Today she has no pain on the distal fibula directly she does have pain over the ATFL and syndesmosis and AITFL.  Pain over the anterior joint line worse with dorsiflexion.  Unable to fully assess anterior drawer and inversion due to guarding from pain      No images are attached to the encounter.    Results   RADIOLOGY Left ankle x-ray: New radiographs taken today show complete healing of fibular fracture, medial clear space has remained intact, there is less tib-fib overlap   Study Result  Narrative & Impression  CLINICAL DATA:  Left ankle pain for 4 months   EXAM: MRI OF THE LEFT ANKLE WITHOUT CONTRAST   TECHNIQUE: Multiplanar, multisequence MR imaging of the ankle was performed. No intravenous contrast was administered.   COMPARISON:  Ankle x-ray 06/14/2023   FINDINGS: TENDONS   Peroneal: Peroneal longus tendon intact. Peroneal brevis intact.   Posteromedial: Posterior tibial tendon intact. Flexor hallucis longus tendon intact. Flexor digitorum longus tendon intact.   Anterior: Tibialis anterior tendon intact. Extensor hallucis longus tendon intact Extensor digitorum longus tendon intact.   Achilles:  Intact.   Plantar Fascia: Intact.   LIGAMENTS   Lateral: Anterior talofibular ligament intact. Calcaneofibular ligament intact. Posterior talofibular ligament intact. Anterior and posterior tibiofibular ligaments intact.   Medial: Deltoid ligament intact. Spring ligament intact.   CARTILAGE   Ankle  Joint: No joint effusion. Normal ankle mortise. No chondral defect.   Subtalar Joints/Sinus Tarsi: Normal subtalar joints. No subtalar joint effusion. Normal sinus tarsi.   Bones: No aggressive osseous lesion. No acute fracture or dislocation. Healed transverse nondisplaced fracture of the lateral malleolus. Mild osteoarthritis of the calcaneocuboid joint with subchondral marrow edema.   Soft Tissue: No fluid collection or hematoma. Muscles are normal without edema or atrophy. Tarsal tunnel is normal.   IMPRESSION: 1. No acute injury of the left ankle. 2. Healed transverse nondisplaced fracture of the lateral malleolus. 3. Mild osteoarthritis of the calcaneocuboid joint with subchondral marrow edema.     Electronically Signed   By: Elige Ko M.D.   On: 06/24/2023 14:53    Assessment:   Encounter Diagnoses  Name Primary?   Ankle instability, left Yes   Closed fracture of left ankle, sequela    Impingement syndrome of left ankle       Plan:  Patient was evaluated and treated and all questions answered.  Assessment and Plan    We reviewed the results of the MRI.  Most of the ligaments appear to be intact but she may have some functional ankle instability.  Discussed possibility of partial tearing that may be missed by the MRI as well.  She also have symptoms consistent with anterior ankle joint impingement and I discussed with her that at this point I would recommend surgical options that she has had several months of physical therapy and bracing and rehab that has not improved.  We  discussed that ankle arthroscopy with debridement of any impinging structures and internal evaluation arthroscopically as well as with stress examination radiographically of the lateral ankle ligaments and syndesmosis to evaluate them further and repair any structures that are incompetent may be beneficial.  We discussed the risk benefits and potential complications of the procedure.  She would  like to wait until summer until school is out to proceed with this.  Informed consent was signed and reviewed, all questions addressed.  Weightbearing as tolerated in shoes until then.    Surgical plan:  Procedure: -Left ankle arthroscopy, arthroscopic and radiographic joint stress examination, possible syndesmotic repair and/or lateral ankle stabilization  Location: -[facility]  Anesthesia plan: -General With regional block  Postoperative pain plan: - Tylenol 1000 mg every 6 hours, ibuprofen 600 mg every 6 hours, gabapentin 300 mg every 8 hours x5 days, oxycodone 5 mg 1-2 tabs every 6 hours only as needed  DVT prophylaxis: -ASA 325 mg twice daily  WB Restrictions / DME needs: -NWB in splint postop   No follow-ups on file.

## 2023-09-08 ENCOUNTER — Telehealth: Payer: Self-pay | Admitting: Podiatry

## 2023-09-08 NOTE — Telephone Encounter (Signed)
 DOS:  10/14/23  (LT) ANKLE ANTHROPOSCOPY-29898 (LT) OPEN TREATMENT OF DISTAL TIBIOFIBULAR JOINT-27829 (LT) REPAIR OF SECONDARY DISRUPTED COLLATERAL LIGAMENT IN ANKLE -16109    EFFECTIVE DATE:   05/04/23  DEDUCTIBLE:  $1,250.00   REMAINING:  $0.00  OOP:   $4,890.00  REMAINING :  $3,144.99   CO INSURANCE: 20%  PER JAS T OF [employer] NO PRIOR AUTH IS REQ FOR CPT CODES [phone]  [id number]

## 2023-09-27 ENCOUNTER — Other Ambulatory Visit: Payer: Self-pay

## 2023-09-27 ENCOUNTER — Emergency Department (HOSPITAL_COMMUNITY)
Admission: EM | Admit: 2023-09-27 | Discharge: 2023-09-28 | Disposition: A | Discharge status: Home / Self Care | Attending: Emergency Medicine | Admitting: Emergency Medicine

## 2023-09-27 ENCOUNTER — Encounter (HOSPITAL_COMMUNITY): Payer: Self-pay

## 2023-09-27 DIAGNOSIS — Z91040 Latex allergy status: Secondary | ICD-10-CM | POA: Insufficient documentation

## 2023-09-27 DIAGNOSIS — K529 Noninfective gastroenteritis and colitis, unspecified: Secondary | ICD-10-CM | POA: Insufficient documentation

## 2023-09-27 DIAGNOSIS — R1032 Left lower quadrant pain: Secondary | ICD-10-CM | POA: Diagnosis present

## 2023-09-27 LAB — COMPREHENSIVE METABOLIC PANEL WITH GFR
ALT: 61 U/L — ABNORMAL HIGH (ref 0–44)
AST: 35 U/L (ref 15–41)
Albumin: 4 g/dL (ref 3.5–5.0)
Alkaline Phosphatase: 69 U/L (ref 38–126)
Anion gap: 10 (ref 5–15)
BUN: 21 mg/dL — ABNORMAL HIGH (ref 6–20)
CO2: 26 mmol/L (ref 22–32)
Calcium: 9.2 mg/dL (ref 8.9–10.3)
Chloride: 100 mmol/L (ref 98–111)
Creatinine, Ser: 0.59 mg/dL (ref 0.44–1.00)
GFR, Estimated: >60 mL/min (ref >60)
Glucose, Bld: 99 mg/dL (ref 70–99)
Potassium: 4.1 mmol/L (ref 3.5–5.1)
Sodium: 136 mmol/L (ref 135–145)
Total Bilirubin: 0.4 mg/dL (ref 0.0–1.2)
Total Protein: 7 g/dL (ref 6.5–8.1)

## 2023-09-27 LAB — CBC WITH DIFFERENTIAL/PLATELET
Abs Immature Granulocytes: 0.01 10*3/uL (ref 0.00–0.07)
Basophils Absolute: 0 10*3/uL (ref 0.0–0.1)
Basophils Relative: 1 %
Eosinophils Absolute: 0.1 10*3/uL (ref 0.0–0.5)
Eosinophils Relative: 2 %
HCT: 39.9 % (ref 36.0–46.0)
Hemoglobin: 12.9 g/dL (ref 12.0–15.0)
Immature Granulocytes: 0 %
Lymphocytes Relative: 50 %
Lymphs Abs: 3.3 10*3/uL (ref 0.7–4.0)
MCH: 29.5 pg (ref 26.0–34.0)
MCHC: 32.3 g/dL (ref 30.0–36.0)
MCV: 91.1 fL (ref 80.0–100.0)
Monocytes Absolute: 0.4 10*3/uL (ref 0.1–1.0)
Monocytes Relative: 6 %
Neutro Abs: 2.7 10*3/uL (ref 1.7–7.7)
Neutrophils Relative %: 41 %
Platelets: 234 10*3/uL (ref 150–400)
RBC: 4.38 MIL/uL (ref 3.87–5.11)
RDW: 12.1 % (ref 11.5–15.5)
WBC: 6.6 10*3/uL (ref 4.0–10.5)
nRBC: 0 % (ref 0.0–0.2)

## 2023-09-27 LAB — URINALYSIS, ROUTINE W REFLEX MICROSCOPIC
Bacteria, UA: NONE SEEN
Bilirubin Urine: NEGATIVE
Glucose, UA: NEGATIVE mg/dL
Hgb urine dipstick: NEGATIVE
Ketones, ur: NEGATIVE mg/dL
Nitrite: NEGATIVE
Protein, ur: NEGATIVE mg/dL
Specific Gravity, Urine: 1.006 (ref 1.005–1.030)
pH: 6 (ref 5.0–8.0)

## 2023-09-27 LAB — LIPASE, BLOOD: Lipase: 49 U/L (ref 11–51)
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
## 2023-09-27 NOTE — ED Triage Notes (Signed)
 Pt reports LLQ abdominal pain/pelvic pain that comes in waves which causes her to feel nauseous, onset today around 1600. Denies vomiting or diarrhea. LBM yesterday.

## 2023-09-28 ENCOUNTER — Emergency Department (HOSPITAL_COMMUNITY)

## 2023-09-28 MED ORDER — ONDANSETRON HCL 4 MG/2ML IJ SOLN
4.0000 mg | Freq: Once | INTRAMUSCULAR | Status: AC
  Administered 2023-09-28: 4 mg via INTRAVENOUS
  Filled 2023-09-28: qty 2

## 2023-09-28 MED ORDER — IOHEXOL 350 MG/ML SOLN
75.0000 mL | Freq: Once | INTRAVENOUS | Status: AC | PRN
  Administered 2023-09-28: 75 mL via INTRAVENOUS

## 2023-09-28 MED ORDER — AMOXICILLIN-POT CLAVULANATE 875-125 MG PO TABS: 1.0000 | ORAL_TABLET | Freq: Two times a day (BID) | ORAL | 0 refills | Status: DC

## 2023-09-28 MED ORDER — OXYCODONE-ACETAMINOPHEN 5-325 MG PO TABS
1.0000 | ORAL_TABLET | ORAL | 0 refills | Status: AC | PRN
Start: 2023-09-28 — End: ?

## 2023-09-28 MED ORDER — SODIUM CHLORIDE 0.9 % IV BOLUS
1000.0000 mL | Freq: Once | INTRAVENOUS | Status: AC
  Administered 2023-09-28: 1000 mL via INTRAVENOUS

## 2023-09-28 MED ORDER — SODIUM CHLORIDE 0.9 % IV SOLN
3.0000 g | Freq: Once | INTRAVENOUS | Status: AC
  Administered 2023-09-28: 3 g via INTRAVENOUS
  Filled 2023-09-28: qty 8

## 2023-09-28 MED ORDER — ONDANSETRON 4 MG PO TBDP: ORAL_TABLET | ORAL | 0 refills | Status: DC

## 2023-09-28 MED ORDER — HYDROMORPHONE HCL 1 MG/ML IJ SOLN
1.0000 mg | Freq: Once | INTRAMUSCULAR | Status: AC
  Administered 2023-09-28: 1 mg via INTRAVENOUS
  Filled 2023-09-28: qty 1

## 2023-09-28 NOTE — ED Notes (Signed)
 Patient transported to CT

## 2023-09-28 NOTE — Discharge Instructions (Addendum)
 Follow up With your GI doctor this week as is scheduled.

## 2023-09-28 NOTE — ED Provider Notes (Signed)
 [place] [facility] Provider Note   CSN: [id number] Arrival date & time: 09/27/23  1923     History  Chief Complaint  Patient presents with   Abdominal Pain    Lisa Mosley is a 48 y.o. female.  Patient presents to the emergency department for evaluation of abdominal pain.  Patient reports that the pain began this afternoon.  Pain is constant but there are waves of more severe pain.  Pain located in the left lower abdomen area.       Home Medications Prior to Admission medications   Medication Sig Start Date End Date Taking? Authorizing Provider  amoxicillin-clavulanate (AUGMENTIN) 875-125 MG tablet Take 1 tablet by mouth every 12 (twelve) hours. 09/28/23  Yes Cressie Betzler, Marine Sia, MD  ondansetron  (ZOFRAN -ODT) 4 MG disintegrating tablet 4mg  ODT q4 hours prn nausea/vomit 09/28/23  Yes Leiloni Smithers, Marine Sia, MD  oxyCODONE -acetaminophen  (PERCOCET) 5-325 MG tablet Take 1 tablet by mouth every 4 (four) hours as needed. 09/28/23  Yes Ballard Bongo, MD  buPROPion (WELLBUTRIN) 75 MG tablet Take by mouth. 11/18/22   [provider]  busPIRone (BUSPAR) 10 MG tablet Take 1 tablet by mouth in the morning and at bedtime.    [provider]  clobetasol ointment (TEMOVATE) 0.05 % daily. 05/17/22   [provider]  escitalopram (LEXAPRO) 20 MG tablet Take 20 mg by mouth daily. 07/12/21   [provider]  ferrous sulfate 325 (65 FE) MG tablet Take 325 mg by mouth daily with breakfast.    [provider]  flecainide  (TAMBOCOR ) 100 MG tablet Take 0.5 tablets (50 mg total) by mouth 2 (two) times daily. 08/06/22   Riddle, Suzann, NP  metoprolol  succinate (TOPROL  XL) 25 MG 24 hr tablet Take 1 tablet (25 mg total) by mouth at bedtime. 12/10/22   Tylene Galla, PA-C  traZODone (DESYREL) 100 MG tablet Take 100-200 mg by mouth at bedtime as needed. 02/19/23   [provider]      Allergies     Nitrofurantoin, Prochlorperazine, Hydrocodone, and Latex    Review of Systems   Review of Systems  Physical Exam Updated Vital Signs BP 116/75   Pulse 89   Temp 98.5 F (36.9 C) (Oral)   Resp 19   Ht 5\' 6"  (1.676 m)   Wt 68 kg   SpO2 94%   BMI 24.21 kg/m  Physical Exam Vitals and nursing note reviewed.  Constitutional:      General: She is not in acute distress.    Appearance: She is well-developed.  HENT:     Head: Normocephalic and atraumatic.     Mouth/Throat:     Mouth: Mucous membranes are moist.  Eyes:     General: Vision grossly intact. Gaze aligned appropriately.     Extraocular Movements: Extraocular movements intact.     Conjunctiva/sclera: Conjunctivae normal.  Cardiovascular:     Rate and Rhythm: Normal rate and regular rhythm.     Pulses: Normal pulses.     Heart sounds: Normal heart sounds, S1 normal and S2 normal. No murmur heard.    No friction rub. No gallop.  Pulmonary:     Effort: Pulmonary effort is normal. No respiratory distress.     Breath sounds: Normal breath sounds.  Abdominal:     General: Bowel sounds are normal.     Palpations: Abdomen is soft.     Tenderness: There is abdominal tenderness in the left lower quadrant. There is no guarding  or rebound.     Hernia: No hernia is present.  Musculoskeletal:        General: No swelling.     Cervical back: Full passive range of motion without pain, normal range of motion and neck supple. No spinous process tenderness or muscular tenderness. Normal range of motion.     Right lower leg: No edema.     Left lower leg: No edema.  Skin:    General: Skin is warm and dry.     Capillary Refill: Capillary refill takes less than 2 seconds.     Findings: No ecchymosis, erythema, rash or wound.  Neurological:     General: No focal deficit present.     Mental Status: She is alert and oriented to person, place, and time.     GCS: GCS eye subscore is 4. GCS verbal subscore is 5. GCS motor subscore is 6.      Cranial Nerves: Cranial nerves 2-12 are intact.     Sensory: Sensation is intact.     Motor: Motor function is intact.     Coordination: Coordination is intact.  Psychiatric:        Attention and Perception: Attention normal.        Mood and Affect: Mood normal.        Speech: Speech normal.        Behavior: Behavior normal.     ED Results / Procedures / Treatments   Labs (all labs ordered are listed, but only abnormal results are displayed) Labs Reviewed  COMPREHENSIVE METABOLIC PANEL WITH GFR - Abnormal; Notable for the following components:      Result Value   BUN 21 (*)    ALT 61 (*)    All other components within normal limits  URINALYSIS, ROUTINE W REFLEX MICROSCOPIC - Abnormal; Notable for the following components:   Color, Urine STRAW (*)    Leukocytes,Ua TRACE (*)    All other components within normal limits  CBC WITH DIFFERENTIAL/PLATELET  LIPASE, BLOOD    EKG None  Radiology CT ABDOMEN PELVIS W CONTRAST Result Date: 09/28/2023 CLINICAL DATA:  48 year old female with left lower quadrant abdominal pain. EXAM: CT ABDOMEN AND PELVIS WITH CONTRAST TECHNIQUE: Multidetector CT imaging of the abdomen and pelvis was performed using the standard protocol following bolus administration of intravenous contrast. RADIATION DOSE REDUCTION: This exam was performed according to the departmental dose-optimization program which includes automated exposure control, adjustment of the mA and/or kV according to patient size and/or use of iterative reconstruction technique. CONTRAST:  75mL OMNIPAQUE  IOHEXOL  350 MG/ML SOLN COMPARISON:  None Available. FINDINGS: Lower chest: Negative.  No pericardial or pleural effusion. Hepatobiliary: Cholecystectomy. Liver and bile ducts within normal limits. Pancreas: Negative. Spleen: Negative. Adrenals/Urinary Tract: Normal adrenal glands. Nonobstructed kidneys but bilateral nephrolithiasis, punctate in the left kidney and up to 2-3 mm in the right renal  upper pole. Symmetric renal enhancement. Numerous pelvic phleboliths also, but the course of both ureters appear negative. No calculus within the bladder. Stomach/Bowel: Abundant large bowel retained stool in redundant sigmoid colon and the rectum. Upstream descending colon is decompressed, but also featureless, and the large bowel appears thick-walled and inflamed from the splenic flexure through the descending segment (series 3, image 44 and series 6, image 48). Most of the transverse colon is also decompressed, mild inflammation in that segment difficult to exclude. Similar decompressed appearance of the ascending colon and cecum. Decompressed terminal ileum. Appendix not identified, might be absent. Nondilated small bowel throughout the abdomen  and pelvis. Chronic postoperative changes to the greater curve of the stomach such as gastric sleeve. But there also appears to be a distal gastrojejunostomy in the right upper quadrant, anastomotic lines there with no adverse features identified. No pneumoperitoneum. No free fluid. Vascular/Lymphatic: Mild Aortoiliac calcified atherosclerosis. Normal caliber abdominal aorta. Major arterial structures and the portal venous system in the abdomen and pelvis appear patent. No lymphadenopathy identified. Reproductive: Surgically absent uterus. Diminutive or absent ovaries. Other: No pelvis free fluid. Musculoskeletal: No acute osseous abnormality identified. IMPRESSION: 1. Positive for long segment Acute Colitis, with large bowel inflammation most pronounced in the Descending Colon. No obstruction, perforation, or abscess. 2. Previous gastric and proximal small bowel surgery with evidence of a distal gastrojejunostomy. No adverse features are identified. 3. Nonobstructing bilateral nephrolithiasis. 4.  Aortic Atherosclerosis (ICD10-I70.0). Electronically Signed   By: Marlise Simpers M.D.   On: 09/28/2023 05:08    Procedures Procedures    Medications Ordered in ED Medications   sodium chloride  0.9 % bolus 1,000 mL (has no administration in time range)  Ampicillin -Sulbactam (UNASYN ) 3 g in sodium chloride  0.9 % 100 mL IVPB (has no administration in time range)  HYDROmorphone  (DILAUDID ) injection 1 mg (has no administration in time range)  ondansetron  (ZOFRAN ) injection 4 mg (has no administration in time range)  iohexol  (OMNIPAQUE ) 350 MG/ML injection 75 mL (75 mLs Intravenous Contrast Given 09/28/23 0429)    ED Course/ Medical Decision Making/ A&P                                 Medical Decision Making Risk Prescription drug management.   Differential Diagnosis considered includes, but not limited to: Appendicitis; colitis; diverticulitis; bowel obstruction; cystitis; nephrolithiasis; pyelonephritis; ovarian cyst, ovarian torsion, PID, ectopic pregnancy.  Patient presents with left lower abdominal/pelvic pain.  She has had prior hysterectomy.  She does report a history of kidney stones.  Examination, however, revealed tenderness diffusely on the left side.  Patient's blood work and urinalysis were unremarkable.  She therefore underwent CT scan to further evaluate.  She does have a long segment colitis without complications.  Patient given IV fluids, antibiotics, analgesia.  She reports that she gets colonoscopy every 3 years because of a history of adenomatous polyps.  She has an appointment with her gastroenterologist in 2 days.  She will therefore be managed outpatient with Augmentin  and given return precautions.        Final Clinical Impression(s) / ED Diagnoses Final diagnoses:  Colitis    Rx / DC Orders ED Discharge Orders          Ordered    amoxicillin -clavulanate (AUGMENTIN ) 875-125 MG tablet  Every 12 hours        09/28/23 0529    ondansetron  (ZOFRAN -ODT) 4 MG disintegrating tablet        09/28/23 0529    oxyCODONE -acetaminophen  (PERCOCET) 5-325 MG tablet  Every 4 hours PRN        09/28/23 0529              Ballard Bongo, MD 09/28/23 [phone]

## 2023-09-28 NOTE — ED Notes (Signed)
 Patient discharged in stable condition, education materials explained including, follow up, any prescriptions and reasons to return. Patient voiced agreement to education and discharge material.

## 2023-10-14 ENCOUNTER — Other Ambulatory Visit: Payer: Self-pay | Admitting: Podiatry

## 2023-10-14 DIAGNOSIS — M25872 Other specified joint disorders, left ankle and foot: Secondary | ICD-10-CM | POA: Diagnosis not present

## 2023-10-14 DIAGNOSIS — S93432A Sprain of tibiofibular ligament of left ankle, initial encounter: Secondary | ICD-10-CM | POA: Diagnosis not present

## 2023-10-14 MED ORDER — IBUPROFEN 600 MG PO TABS: 600.0000 mg | ORAL_TABLET | Freq: Three times a day (TID) | ORAL | 0 refills | Status: AC | PRN

## 2023-10-14 MED ORDER — ACETAMINOPHEN 500 MG PO TABS: 1000.0000 mg | ORAL_TABLET | Freq: Four times a day (QID) | ORAL | 0 refills | Status: AC | PRN

## 2023-10-14 MED ORDER — GABAPENTIN 300 MG PO CAPS: 300.0000 mg | ORAL_CAPSULE | Freq: Three times a day (TID) | ORAL | 0 refills | Status: DC

## 2023-10-14 MED ORDER — OXYCODONE HCL 5 MG PO TABS
5.0000 mg | ORAL_TABLET | ORAL | 0 refills | Status: AC | PRN
Start: 2023-10-14 — End: 2023-10-21

## 2023-10-14 MED ORDER — ASPIRIN 325 MG PO TBEC: 325.0000 mg | DELAYED_RELEASE_TABLET | Freq: Two times a day (BID) | ORAL | 0 refills | Status: AC

## 2023-10-14 MED ORDER — ONDANSETRON 4 MG PO TBDP: 4.0000 mg | ORAL_TABLET | Freq: Three times a day (TID) | ORAL | 0 refills | Status: DC | PRN

## 2023-10-20 ENCOUNTER — Ambulatory Visit (INDEPENDENT_AMBULATORY_CARE_PROVIDER_SITE_OTHER)

## 2023-10-20 ENCOUNTER — Ambulatory Visit (INDEPENDENT_AMBULATORY_CARE_PROVIDER_SITE_OTHER): Admitting: Podiatry

## 2023-10-20 ENCOUNTER — Encounter: Payer: Self-pay | Admitting: Podiatry

## 2023-10-20 VITALS — Ht 66.0 in | Wt 150.0 lb

## 2023-10-20 DIAGNOSIS — M25372 Other instability, left ankle: Secondary | ICD-10-CM

## 2023-10-20 DIAGNOSIS — S93432D Sprain of tibiofibular ligament of left ankle, subsequent encounter: Secondary | ICD-10-CM

## 2023-10-20 DIAGNOSIS — M25872 Other specified joint disorders, left ankle and foot: Secondary | ICD-10-CM

## 2023-10-20 NOTE — Progress Notes (Signed)
  Subjective:  Patient ID: Lisa Mosley, female    DOB: 02-09-1976,  MRN: [id number]  Chief Complaint  Patient presents with   Routine Post Op    POV # 1 DOS 10/14/23 LT ANKLE ARTHOSCOPY W POSSIBLE LIGAMENT REPAIR    48 y.o. female returns for post-op check.  Doing well pain is controlled  Review of Systems: Negative except as noted in the HPI. Denies N/V/F/Ch.   Objective:  There were no vitals filed for this visit. Body mass index is 24.21 kg/m. Constitutional Well developed. Well nourished.  Vascular Foot warm and well perfused. Capillary refill normal to all digits.  Calf is soft and supple, no posterior calf or knee pain, negative Homans' sign  Neurologic Normal speech. Oriented to person, place, and time. Epicritic sensation to light touch grossly present bilaterally.  Dermatologic Skin healing well without signs of infection. Skin edges well coapted without signs of infection.  Orthopedic: Tenderness to palpation noted about the surgical site.   Multiple view plain film radiographs: Implants intact and in immediate postoperative positions Assessment:   1. Ankle instability, left   2. Impingement syndrome of left ankle   3. Ankle syndesmosis disruption, left, subsequent encounter    Plan:  Patient was evaluated and treated and all questions answered.  S/p foot surgery right -Progressing as expected post-operatively.  Intraoperative results reviewed with patient. -XR: Noted above no complication -WB Status: Nonweightbearing in CAM boot.  Plan to transition to weightbearing and physical therapy after next visit -Sutures: Removed in 2 weeks. -Medications: No refills required -Foot redressed.  No follow-ups on file.

## 2023-10-25 ENCOUNTER — Ambulatory Visit

## 2023-10-25 NOTE — Therapy (Deleted)
 OUTPATIENT PHYSICAL THERAPY LOWER EXTREMITY EVALUATION   Patient Name: Lisa Mosley MRN: [id number] DOB:1975-08-31, 48 y.o., female Today's Date: 10/25/2023  END OF SESSION:   Past Medical History:  Diagnosis Date   HTN (hypertension)    Migraines    Nephrolithiasis    PVC (premature ventricular contraction)    Stomach tumor (benign)    Past Surgical History:  Procedure Laterality Date   ABDOMINAL HYSTERECTOMY     APPENDECTOMY     CHOLECYSTECTOMY     HERNIA REPAIR     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)     TUBAL LIGATION     Now reversed   Patient Active Problem List   Diagnosis Date Noted   PVC's (premature ventricular contractions) 07/13/2017   Fatigue 07/13/2017   Hypertension 07/13/2017   Severe dysplasia of cervix 04/17/2015    PCP: ***  REFERRING PROVIDER: ***  REFERRING DIAG: ***  THERAPY DIAG:  No diagnosis found.  Rationale for Evaluation and Treatment: {HABREHAB:27488}  ONSET DATE: ***  SUBJECTIVE:   SUBJECTIVE STATEMENT: ***  PERTINENT HISTORY: *** PAIN:  Are you having pain? {OPRCPAIN:27236}  PRECAUTIONS: {Therapy precautions:24002}  RED FLAGS: {PT Red Flags:29287}   WEIGHT BEARING RESTRICTIONS: {Yes ***/No:24003}  FALLS:  Has patient fallen in last 6 months? {fallsyesno:27318}  LIVING ENVIRONMENT: Lives with: {OPRC lives with:25569::lives with their family} Lives in: {Lives in:25570} Stairs: {opstairs:27293} Has following equipment at home: {Assistive devices:23999}  OCCUPATION: ***  PLOF: {PLOF:24004}  PATIENT GOALS: ***  NEXT MD VISIT: ***  OBJECTIVE:  Note: Objective measures were completed at Evaluation unless otherwise noted.  DIAGNOSTIC FINDINGS: ***  PATIENT SURVEYS:  {rehab surveys:24030}  COGNITION: Overall cognitive status: {cognition:24006}     SENSATION: {sensation:27233}  EDEMA:  {edema:24020}  MUSCLE LENGTH: Hamstrings: Right *** deg; Left ***  deg Debby test: Right *** deg; Left *** deg  POSTURE: {posture:25561}  PALPATION: ***  LOWER EXTREMITY ROM:  {AROM/PROM:27142} ROM Right eval Left eval  Hip flexion    Hip extension    Hip abduction    Hip adduction    Hip internal rotation    Hip external rotation    Knee flexion    Knee extension    Ankle dorsiflexion    Ankle plantarflexion    Ankle inversion    Ankle eversion     (Blank rows = not tested)  LOWER EXTREMITY MMT:  MMT Right eval Left eval  Hip flexion    Hip extension    Hip abduction    Hip adduction    Hip internal rotation    Hip external rotation    Knee flexion    Knee extension    Ankle dorsiflexion    Ankle plantarflexion    Ankle inversion    Ankle eversion     (Blank rows = not tested)  LOWER EXTREMITY SPECIAL TESTS:  {LEspecialtests:26242}  FUNCTIONAL TESTS:  {Functional tests:24029}  GAIT: Distance walked: *** Assistive device utilized: {Assistive devices:23999} Level of assistance: {Levels of assistance:24026} Comments: ***  TREATMENT DATE: ***    PATIENT EDUCATION:  Education details: *** Person educated: {Person educated:25204} Education method: {Education Method:25205} Education comprehension: {Education Comprehension:25206}  HOME EXERCISE PROGRAM: ***  ASSESSMENT:  CLINICAL IMPRESSION: Patient is a *** y.o. *** who was seen today for physical therapy evaluation and treatment for ***.   OBJECTIVE IMPAIRMENTS: {opptimpairments:25111}.   ACTIVITY LIMITATIONS: {activitylimitations:27494}  PARTICIPATION LIMITATIONS: {participationrestrictions:25113}  PERSONAL FACTORS: {Personal factors:25162} are also affecting patient's functional outcome.   REHAB POTENTIAL: {rehabpotential:25112}  CLINICAL DECISION MAKING: {clinical decision making:25114}  EVALUATION COMPLEXITY: {Evaluation  complexity:25115}   GOALS: Goals reviewed with patient? {yes/no:20286}  SHORT TERM GOALS: Target date: *** *** Baseline: Goal status: INITIAL  2.  *** Baseline:  Goal status: INITIAL  3.  *** Baseline:  Goal status: INITIAL  4.  *** Baseline:  Goal status: INITIAL  5.  *** Baseline:  Goal status: INITIAL  6.  *** Baseline:  Goal status: INITIAL  LONG TERM GOALS: Target date: ***  *** Baseline:  Goal status: INITIAL  2.  *** Baseline:  Goal status: INITIAL  3.  *** Baseline:  Goal status: INITIAL  4.  *** Baseline:  Goal status: INITIAL  5.  *** Baseline:  Goal status: INITIAL  6.  *** Baseline:  Goal status: INITIAL   PLAN:  PT FREQUENCY: {rehab frequency:25116}  PT DURATION: {rehab duration:25117}  PLANNED INTERVENTIONS: {rehab planned interventions:25118::97110-Therapeutic exercises,97530- Therapeutic [phone]- Neuromuscular re-education,97535- Self Rjmz,02859- Manual therapy}  PLAN FOR NEXT SESSION: ***   Reyes CHRISTELLA Kohut, PT 10/25/2023, 11:20 AM

## 2023-10-28 ENCOUNTER — Ambulatory Visit

## 2023-11-03 ENCOUNTER — Ambulatory Visit (INDEPENDENT_AMBULATORY_CARE_PROVIDER_SITE_OTHER)

## 2023-11-03 ENCOUNTER — Ambulatory Visit: Admitting: Podiatry

## 2023-11-03 DIAGNOSIS — M25872 Other specified joint disorders, left ankle and foot: Secondary | ICD-10-CM

## 2023-11-03 DIAGNOSIS — S82892D Other fracture of left lower leg, subsequent encounter for closed fracture with routine healing: Secondary | ICD-10-CM

## 2023-11-03 DIAGNOSIS — S93432D Sprain of tibiofibular ligament of left ankle, subsequent encounter: Secondary | ICD-10-CM

## 2023-11-03 NOTE — Progress Notes (Signed)
  Subjective:  Patient ID: Lisa Mosley, female    DOB: Jun 05, 1975,  MRN: [id number]  Chief Complaint  Patient presents with   Routine Post Op    Rm22 DOS 10/14/23 LT ANKLE ARTHOSCOPY W POSSIBLE LIGAMENT REPAIR/ Patient says that she is doing well.   48 y.o. female returns for post-op check.  Doing well pain is controlled  Review of Systems: Negative except as noted in the HPI. Denies N/V/F/Ch.   Objective:  There were no vitals filed for this visit. There is no height or weight on file to calculate BMI. Constitutional Well developed. Well nourished.  Vascular Foot warm and well perfused. Capillary refill normal to all digits.  Calf is soft and supple, no posterior calf or knee pain, negative Homans' sign  Neurologic Normal speech. Oriented to person, place, and time. Epicritic sensation to light touch grossly present bilaterally.  Dermatologic Skin healing well without signs of infection. Skin edges well coapted without signs of infection.  Orthopedic: Tenderness to palpation noted about the surgical site.   Multiple view plain film radiographs: Implants intact and in immediate postoperative positions Assessment:   1. Closed fracture of left ankle with routine healing, subsequent encounter   2. Impingement syndrome of left ankle   3. Ankle syndesmosis disruption, left, subsequent encounter    Plan:  Patient was evaluated and treated and all questions answered.  S/p foot surgery right - Doing well.  Sutures removed uneventfully.  May begin weightbearing as tolerated in boot next week.  She starts physical therapy as well.  Plan to transition to lace up ankle brace and shoe next visit.  No follow-ups on file.

## 2023-11-03 NOTE — Therapy (Signed)
 OUTPATIENT PHYSICAL THERAPY LOWER EXTREMITY EVALUATION   Patient Name: Janus Vlcek MRN: [id number] DOB:[birth year], 48 y.o., female Today's Date: 11/08/2023  END OF SESSION:  PT End of Session - 11/08/23 1450     Visit Number 1    Number of Visits 16    Date for PT Re-Evaluation 01/03/24    Authorization Type [employer]    PT Start Time 0800    PT Stop Time 0845    PT Time Calculation (min) 45 min    Activity Tolerance Patient limited by pain;Patient tolerated treatment well    Behavior During Therapy WFL for tasks assessed/performed          Past Medical History:  Diagnosis Date   HTN (hypertension)    Migraines    Nephrolithiasis    PVC (premature ventricular contraction)    Stomach tumor (benign)    Past Surgical History:  Procedure Laterality Date   ABDOMINAL HYSTERECTOMY     APPENDECTOMY     CHOLECYSTECTOMY     HERNIA REPAIR     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)     TUBAL LIGATION     Now reversed   Patient Active Problem List   Diagnosis Date Noted   PVC's (premature ventricular contractions) 07/13/2017   Fatigue 07/13/2017   Hypertension 07/13/2017   Severe dysplasia of cervix 04/17/2015    PCP: Leonel Cole, MD   REFERRING PROVIDER:  OUTPATIENT PHYSICAL THERAPY LOWER EXTREMITY EVALUATION   Patient Name: Kaidan Spengler MRN: [id number] DOB:08/12/1975, 48 y.o., female Today's Date: 11/08/2023  END OF SESSION:  PT End of Session - 11/08/23 1450     Visit Number 1    Number of Visits 16    Date for PT Re-Evaluation 01/03/24    Authorization Type [employer]    PT Start Time 0800    PT Stop Time 0845    PT Time Calculation (min) 45 min    Activity Tolerance Patient limited by pain;Patient tolerated treatment well    Behavior During Therapy WFL for tasks assessed/performed          Past Medical History:  Diagnosis Date   HTN (hypertension)    Migraines    Nephrolithiasis    PVC (premature  ventricular contraction)    Stomach tumor (benign)    Past Surgical History:  Procedure Laterality Date   ABDOMINAL HYSTERECTOMY     APPENDECTOMY     CHOLECYSTECTOMY     HERNIA REPAIR     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)     TUBAL LIGATION     Now reversed   Patient Active Problem List   Diagnosis Date Noted   PVC's (premature ventricular contractions) 07/13/2017   Fatigue 07/13/2017   Hypertension 07/13/2017   Severe dysplasia of cervix 04/17/2015    PCP: Leonel Cole, MD   REFERRING PROVIDER: Silva Juliene SAUNDERS, DPM   REFERRING DIAG:  [phone] (ICD-10-CM) - Ankle instability, left  M25.872 (ICD-10-CM) - Impingement syndrome of left ankle  S93.432D (ICD-10-CM) - Ankle syndesmosis disruption, left, subsequent encounter    THERAPY DIAG:  Pain in left ankle and joints of left foot  Other abnormalities of gait and mobility  Muscle weakness (generalized)  Rationale for Evaluation and Treatment: Rehabilitation  ONSET DATE: surgery 10-14-23  SUBJECTIVE:   SUBJECTIVE STATEMENT: Pt accompanied by spouse to evaluation.Pt has been seen in clinic formerly for L distal fibular fx with avulsion of medial malleolus after fall 02-01-23. I had surgery on 10-14-23  and I was here in clinic in January 2025. I broke my ankle in October 2024 and was never painfree. I had surgery and had ligament repaired and now I am in PT. I would like to be able to walk my dog, boxer pit bull mix and eventually running.  I would also like to return to strength training. I was sometimes at [employer] and [facility] [place]. 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 I am non wt bearing and I am not doing household chores  PERTINENT HISTORY: Stomach tumor removed benign, PVC's, osteopenia, bariatric surgery PAIN:  Are you having pain? Yes: NPRS scale: at rest 2/10  and at worst 5/10 Pain location: Left and Right lateral ankle where incisions are. Pain description:  achy Aggravating factors: trying to stretch too much in Dorsiflexion, not standing for longer than 5 minutes Relieving factors: ice and elevate leg and use PRICE Aggravating factors- cat lies on it initially   PRECAUTIONS: Other: WBAT L in CAM walking boot  RED FLAGS: None   WEIGHT BEARING RESTRICTIONS: Yes WBAT L  FALLS:  Has patient fallen in last 6 months? Yes. Number of falls 5 x  while using crutches NWB L  LIVING ENVIRONMENT: Lives with: lives with their family Lives in: House/apartment Stairs: 4 steps to porch and inside but bedroom on main floor Has following equipment at home: Single point cane, Crutches, Wheelchair (manual), and Tour manager  OCCUPATION: High school Teacher English  PLOF: Independent  PATIENT GOALS: decrease pain in left foot and be able to return to walking dog  NEXT MD VISIT: TBD  OBJECTIVE:  Note: Objective measures were completed at Evaluation unless otherwise noted.  DIAGNOSTIC FINDINGS: See medical record  PATIENT SURVEYS:  LEFS  Extreme difficulty/unable (0), Quite a bit of difficulty (1), Moderate difficulty (2), Little difficulty (3), No difficulty (4) Survey date:    11-08-23  Any of your usual work, housework or school activities     0  2. Usual hobbies, recreational or sporting activities      0  3. Getting into/out of the bath      1  4. Walking between rooms       0  5. Putting on socks/shoes       4  6. Squatting       0  7. Lifting an object, like a bag of groceries from the floor       1  8. Performing light activities around your home       1  9. Performing heavy activities around your home       1  10. Getting into/out of a car       2  11. Walking 2 blocks       0  12. Walking 1 mile       0  13. Going up/down 10 stairs (1 flight)       0  14. Standing for 1 hour       0  15.  sitting for 1 hour        4  16. Running on even ground       0  17. Running on uneven ground        0  18. Making sharp turns while running  fast        0  19. Hopping         0  20. Rolling over in bed       4  Score  total:  18/80  (22.5 %)     COGNITION: Overall cognitive status: Within functional limits for tasks assessed     SENSATION: WFL  EDEMA:  Figure 8: Right 49 cm  L 51 cm    POSTURE: rounded shoulders, forward head, and weight shift right  PALPATION: TTP to bil ankle malleous and along L fibularis longus and brevis  LOWER EXTREMITY ROM:  Active ROM Right eval Left eval  Hip flexion    Hip extension    Hip abduction    Hip adduction    Hip internal rotation    Hip external rotation    Knee flexion 130 132  Knee extension    Ankle dorsiflexion 15 1  Ankle plantarflexion 55 49  Ankle inversion 40 29  Ankle eversion 35 22   (Blank rows = not tested)  LOWER EXTREMITY MMT:  Left side not tested due to post surgery  MMT Right eval Left eval  Hip flexion 5   Hip extension    Hip abduction    Hip adduction    Hip internal rotation    Hip external rotation    Knee flexion 5   Knee extension 5   Ankle dorsiflexion 5 3-  Ankle plantarflexion 4 3-  Ankle inversion 5 3-  Ankle eversion 5 3-   (Blank rows = not tested)  LOWER EXTREMITY SPECIAL TESTS:  NT due to post surgery FUNCTIONAL TESTS:  5 times sit to stand: 9.73 sec 2 minute walk test: TBD     SL stance Left 0 sec  Right  30 sec GAIT: Distance walked: 300 ft Assistive device utilized: Crutches WBAT L Level of assistance: Modified independence Comments: Pt with better balance with WBAT L and able to execute without difficulty.  Pt with decreased gait speed and wt bear to right                                                                                                                                 TREATMENT DATE: EVAL  and HEP    PATIENT EDUCATION:  Education details: POC, Explanation of findings, PRICE care of post surgical incision/ post op care, WBAT L and issue HEP Person educated: Patient and Spouse Education  method: Explanation, Demonstration, Tactile cues, Verbal cues, and Handouts Education comprehension: verbalized understanding, returned demonstration, verbal cues required, tactile cues required, and needs further education  HOME EXERCISE PROGRAM: Access Code: [email or web address] URL: [email or web address] Date: 11/08/2023 Prepared by: Graydon Dingwall  Exercises - Supine Ankle Dorsiflexion and Plantarflexion AROM  - 1 x daily - 7 x weekly - 3 sets - 10 reps - Supine Ankle Inversion and Eversion AROM  - 1 x daily - 7 x weekly - 3 sets - 10 reps - Supine Ankle Circles  - 1 x daily - 7 x weekly - 3 sets - 10 reps - Standing Weight Shift Side to Side  - 1 x daily - 7 x  weekly - 3 sets - 10 reps - Standing Weight Shifting Forward and Backward  - 1 x daily - 7 x weekly - 3 sets - 10 reps - Staggered Stance Step through [employer] on Crutches  - 1 x daily - 7 x weekly - 3 sets - 10 reps  ASSESSMENT:  CLINICAL IMPRESSION: Patient is a 48 y.o. female who was seen today for physical therapy evaluation and treatment for Left ankle arthroscopy/syndesmosis repair now WBAT L in CAM boot and now beginning WBAT L today. Pt reports falling with crutches with lack of balance due to NWB status. Pt seems to be more steady post session with new WB status. Pt LEFS score shows decrease in functional ability below PLOF. Pt would benefit from skilled PT to address impairments, decrease pain and return to WNL ankle ROM and improving gait and balance to return to walking dog and safe mobility for home and work  OBJECTIVE IMPAIRMENTS: decreased activity tolerance, decreased balance, decreased knowledge of condition, decreased knowledge of use of DME, decreased mobility, difficulty walking, decreased ROM, decreased strength, and pain.   ACTIVITY LIMITATIONS: carrying, lifting, bending, standing, squatting, stairs, transfers, bed mobility, and locomotion level  PARTICIPATION LIMITATIONS: meal prep, cleaning, laundry,  driving, shopping, community activity, and occupation  PERSONAL FACTORS: Stomach tumor removed benign, PVC's, osteopenia, bariatric surgery are also affecting patient's functional outcome.   REHAB POTENTIAL: Excellent  CLINICAL DECISION MAKING: Stable/uncomplicated  EVALUATION COMPLEXITY: Low   GOALS: Goals reviewed with patient? Yes  SHORT TERM GOALS: Target date: 12-06-23 Pt will be compliant and knowledgeable with initial HEP for improved comfort and carryover and care post op Baseline:limited knowledge Goal status: INITIAL  2.  Pt will self report left ankle pain not greater than 4/10 for improved comfort and functional mobility in CAM boot with WBAT L Baseline: WBAT L  5/10 Goal status: INITIAL  3.  Pt will be able to wean to a normal tennis show with minimal pain Baseline: In CAM walking boot Goal status: INITIAL    LONG TERM GOALS: Target date: 01-26-24  I with advanced HEP Baseline: limited knowledge Goal status: INITIAL  2.  Pt will walk dog for 1 mile with LRAD with minimal pain 2/10 or less Baseline: on crutches and WBAT L Goal status: INITIAL  3.   Pt will improve L ankle DF ROM to no less than 10 degrees for improved ankle mobility and gait  Baseline: Left DF 1 degree Goal status: INITIAL  4.  Pt LEFS will improve to at least 36/80 to show improved functional change over 8 weeks Baseline: 18/80 Goal status: INITIAL  5.  Pt will be able to perform standing SL heel raise on LT 15/25 x to show improved tolerance and strength  Baseline: 0/25  Goal Status INITIAL  6. Pt will be able to perform household chores with minimal pain for 30 minutes  Baseline: unable to stand for more than 5 minutes  Goal Status INITIAL   PLAN:  PT FREQUENCY: 1-2x/week  PT DURATION: 8 weeks  PLANNED INTERVENTIONS: 97164- PT Re-evaluation, 97750- Physical Performance Testing, 97110-Therapeutic exercises, 97530- Therapeutic activity, [id number]- Neuromuscular re-education, 97535-  Self Care, 02859- Manual therapy, [id number]- Gait training, [phone]- Aquatic Therapy, [phone]- Electrical stimulation (manual), Patient/Family education, Balance training, Stair training, Taping, Joint mobilization, Spinal mobilization, DME instructions, Cryotherapy, and Moist heat  PLAN FOR NEXT SESSION: Gait training, progress HEP as pt tolerates. WBAT L   Graydon Dingwall, PT, [facility] Certified Exercise Expert for the Aging Adult  11/08/23 2:51 PM Phone: [phone] Fax: [phone]   REFERRING DIAG:  M25.372 (ICD-10-CM) - Ankle instability, left  M25.872 (ICD-10-CM) - Impingement syndrome of left ankle  S93.432D (ICD-10-CM) - Ankle syndesmosis disruption, left, subsequent encounter    THERAPY DIAG:  Pain in left ankle and joints of left foot - Plan: PT plan of care cert/re-cert  Other abnormalities of gait and mobility - Plan: PT plan of care cert/re-cert  Muscle weakness (generalized) - Plan: PT plan of care cert/re-cert  Rationale for Evaluation and Treatment: Rehabilitation  ONSET DATE: surgery 10-14-23  SUBJECTIVE:   SUBJECTIVE STATEMENT: Pt accompanied by spouse to evaluation.Pt has been seen in clinic formerly for L distal fibular fx with avulsion of medial malleolus after fall 02-01-23. I had surgery on 10-14-23 and I was here in clinic in January 2025. I broke my ankle in October 2024 and was never painfree. I had surgery and had ligament repaired and now I am in PT. I would like to be able to walk my dog, boxer pit bull mix and eventually running.  I would also like to return to strength training. 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 I am non wt bearing and I am not doing household chores  PERTINENT HISTORY: Stomach tumor removed benign, PVC's, osteopenia, bariatric surgery PAIN:  Are you having pain? Yes: NPRS scale: at rest 2/10  and at worst 5/10 Pain location: Left and Right lateral ankle where incisions are. Pain  description: achy Aggravating factors: trying to stretch too much in Dorsiflexion, not standing for longer than 5 minutes Relieving factors: ice and elevate leg and use PRICE Aggravating factors- cat lies on it initially   PRECAUTIONS: Other: WBAT L in CAM walking boot  RED FLAGS: None   WEIGHT BEARING RESTRICTIONS: Yes WBAT L  FALLS:  Has patient fallen in last 6 months? Yes. Number of falls 5 x  while using crutches NWB L  LIVING ENVIRONMENT: Lives with: lives with their family Lives in: House/apartment Stairs: 4 steps to porch and inside but bedroom on main floor Has following equipment at home: Single point cane, Crutches, Wheelchair (manual), and Tour manager  OCCUPATION: High school Teacher English  PLOF: Independent  PATIENT GOALS: decrease pain in left foot and be able to return to walking dog  NEXT MD VISIT: TBD  OBJECTIVE:  Note: Objective measures were completed at Evaluation unless otherwise noted.  DIAGNOSTIC FINDINGS: See medical record  PATIENT SURVEYS:  LEFS  Extreme difficulty/unable (0), Quite a bit of difficulty (1), Moderate difficulty (2), Little difficulty (3), No difficulty (4) Survey date:    11-08-23  Any of your usual work, housework or school activities     0  2. Usual hobbies, recreational or sporting activities      0  3. Getting into/out of the bath      1  4. Walking between rooms       0  5. Putting on socks/shoes       4  6. Squatting       0  7. Lifting an object, like a bag of groceries from the floor       1  8. Performing light activities around your home       1  9. Performing heavy activities around your home       1  10. Getting into/out of a car       2  11. Walking 2 blocks  0  12. Walking 1 mile       0  13. Going up/down 10 stairs (1 flight)       0  14. Standing for 1 hour       0  15.  sitting for 1 hour        4  16. Running on even ground       0  17. Running on uneven ground        0  18. Making sharp turns  while running fast        0  19. Hopping         0  20. Rolling over in bed       4  Score total:  18/80  (22.5 %)     COGNITION: Overall cognitive status: Within functional limits for tasks assessed     SENSATION: WFL  EDEMA:  Figure 8: Right 49 cm  L 51 cm    POSTURE: rounded shoulders, forward head, and weight shift right  PALPATION: TTP to bil ankle malleous and along L fibularis longus and brevis  LOWER EXTREMITY ROM:  Active ROM Right eval Left eval  Hip flexion    Hip extension    Hip abduction    Hip adduction    Hip internal rotation    Hip external rotation    Knee flexion 130 132  Knee extension    Ankle dorsiflexion 15 1  Ankle plantarflexion 55 49  Ankle inversion 40 29  Ankle eversion 35 22   (Blank rows = not tested)  LOWER EXTREMITY MMT:  Left side not tested due to post surgery  MMT Right eval Left eval  Hip flexion 5   Hip extension    Hip abduction    Hip adduction    Hip internal rotation    Hip external rotation    Knee flexion 5   Knee extension 5   Ankle dorsiflexion 5 3-  Ankle plantarflexion 4 3-  Ankle inversion 5 3-  Ankle eversion 5 3-   (Blank rows = not tested)  LOWER EXTREMITY SPECIAL TESTS:  NT due to post surgery FUNCTIONAL TESTS:  5 times sit to stand: 9.73 sec 2 minute walk test: TBD     SL stance Left 0 sec  Right  30 sec GAIT: Distance walked: 300 ft Assistive device utilized: Crutches WBAT L Level of assistance: Modified independence Comments: Pt with better balance with WBAT L and able to execute without difficulty.  Pt with decreased gait speed and wt bear to right                                                                                                                                 TREATMENT DATE: EVAL  and HEP    PATIENT EDUCATION:  Education details: POC, Explanation of findings, PRICE care of post surgical incision/ post op care, WBAT L and issue HEP  Person educated: Patient and  Spouse Education method: Explanation, Demonstration, Tactile cues, Verbal cues, and Handouts Education comprehension: verbalized understanding, returned demonstration, verbal cues required, tactile cues required, and needs further education  HOME EXERCISE PROGRAM: Access Code: [email or web address] URL: [email or web address] Date: 11/08/2023 Prepared by: Graydon Dingwall  Exercises - Supine Ankle Dorsiflexion and Plantarflexion AROM  - 1 x daily - 7 x weekly - 3 sets - 10 reps - Supine Ankle Inversion and Eversion AROM  - 1 x daily - 7 x weekly - 3 sets - 10 reps - Supine Ankle Circles  - 1 x daily - 7 x weekly - 3 sets - 10 reps - Standing Weight Shift Side to Side  - 1 x daily - 7 x weekly - 3 sets - 10 reps - Standing Weight Shifting Forward and Backward  - 1 x daily - 7 x weekly - 3 sets - 10 reps - Staggered Stance Step through [employer] on Crutches  - 1 x daily - 7 x weekly - 3 sets - 10 reps  ASSESSMENT:  CLINICAL IMPRESSION: Patient is a 48 y.o. female who was seen today for physical therapy evaluation and treatment for Left ankle arthroscopy/syndesmosis repair now WBAT L in CAM boot and now beginning WBAT L today. Pt reports falling with crutches with lack of balance due to NWB status. Pt seems to be more steady post session with new WB status. Pt LEFS score shows decrease in functional ability below PLOF. Pt would benefit from skilled PT to address impairments, decrease pain and return to WNL ankle ROM and improving gait and balance to return to walking dog and safe mobility for home and work  OBJECTIVE IMPAIRMENTS: decreased activity tolerance, decreased balance, decreased knowledge of condition, decreased knowledge of use of DME, decreased mobility, difficulty walking, decreased ROM, decreased strength, and pain.   ACTIVITY LIMITATIONS: carrying, lifting, bending, standing, squatting, stairs, transfers, bed mobility, and locomotion level  PARTICIPATION LIMITATIONS: meal prep,  cleaning, laundry, driving, shopping, community activity, and occupation  PERSONAL FACTORS: Stomach tumor removed benign, PVC's, osteopenia, bariatric surgery are also affecting patient's functional outcome.   REHAB POTENTIAL: Excellent  CLINICAL DECISION MAKING: Stable/uncomplicated  EVALUATION COMPLEXITY: Low   GOALS: Goals reviewed with patient? Yes  SHORT TERM GOALS: Target date: 12-06-23 Pt will be compliant and knowledgeable with initial HEP for improved comfort and carryover and care post op Baseline:limited knowledge Goal status: INITIAL  2.  Pt will self report left ankle pain not greater than 4/10 for improved comfort and functional mobility in CAM boot with WBAT L Baseline: WBAT L  5/10 Goal status: INITIAL  3.  Pt will be able to wean to a normal tennis show with minimal pain Baseline: In CAM walking boot Goal status: INITIAL    LONG TERM GOALS: Target date: 01-26-24  I with advanced HEP Baseline: limited knowledge Goal status: INITIAL  2.  Pt will walk dog for 1 mile with LRAD with minimal pain 2/10 or less Baseline: on crutches and WBAT L Goal status: INITIAL  3.   Pt will improve L ankle DF ROM to no less than 10 degrees for improved ankle mobility and gait  Baseline: Left DF 1 degree Goal status: INITIAL  4.  Pt LEFS will improve to at least 36/80 to show improved functional change over 8 weeks Baseline: 18/80 Goal status: INITIAL  5.  Pt will be able to perform standing SL heel raise on LT 15/25 x to show improved tolerance and strength  Baseline:  0/25  Goal Status INITIAL  6. Pt will be able to perform household chores with minimal pain for 30 minutes  Baseline: unable to stand for more than 5 minutes  Goal Status INITIAL   PLAN:  PT FREQUENCY: 1-2x/week  PT DURATION: 8 weeks  PLANNED INTERVENTIONS: 97164- PT Re-evaluation, 97750- Physical Performance Testing, 97110-Therapeutic exercises, 97530- Therapeutic activity, [id number]- Neuromuscular  re-education, 97535- Self Care, 02859- Manual therapy, [id number]- Gait training, [phone]- Aquatic Therapy, [phone]- Electrical stimulation (manual), Patient/Family education, Balance training, Stair training, Taping, Joint mobilization, Spinal mobilization, DME instructions, Cryotherapy, and Moist heat  PLAN FOR NEXT SESSION: Gait training, progress HEP as pt tolerates. WBAT L   Graydon Dingwall, PT, [facility] Certified Exercise Expert for the Aging Adult  11/08/23 5:03 PM Phone: [phone] Fax: [phone]

## 2023-11-05 ENCOUNTER — Other Ambulatory Visit: Payer: Self-pay | Admitting: Podiatry

## 2023-11-06 IMAGING — US US ABDOMEN LIMITED
1 series · 14 of 25 positions shown · non-contrast
Comparison: None Available.

CLINICAL DATA: Abnormal liver function.

EXAM:
ULTRASOUND ABDOMEN LIMITED RIGHT UPPER QUADRANT

[Series 1: us abdomen limited · 0.31mm/px · 14 of 38 slices shown]
[im 1/38]
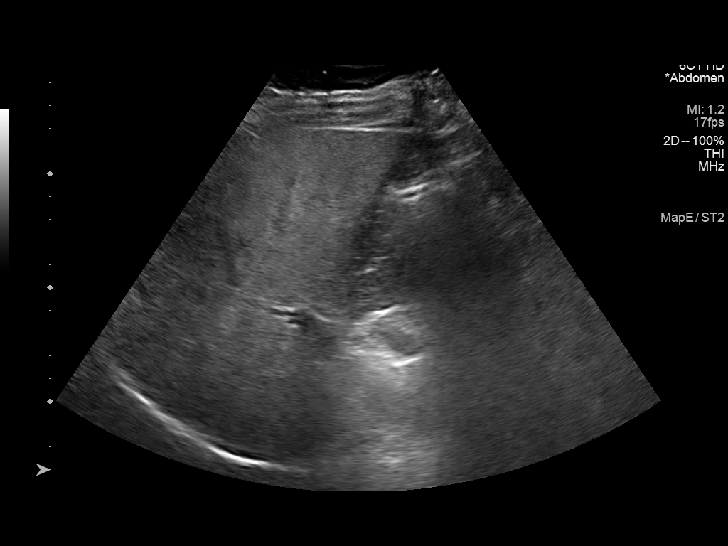
[im 4/38]
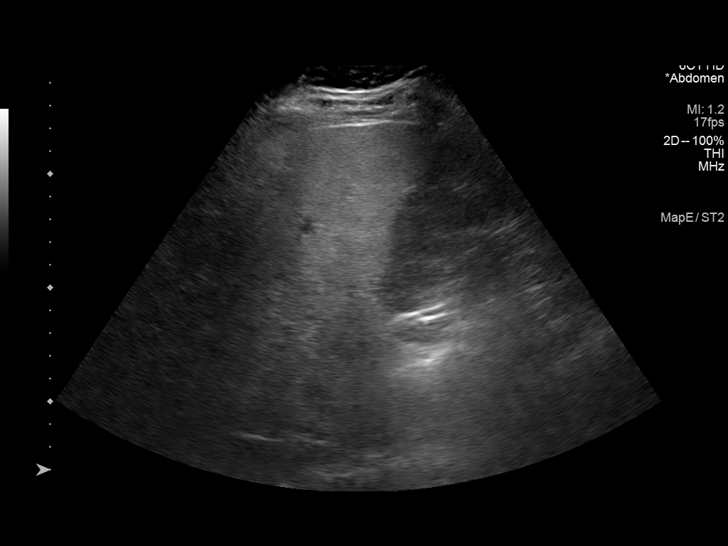
[im 7/38]
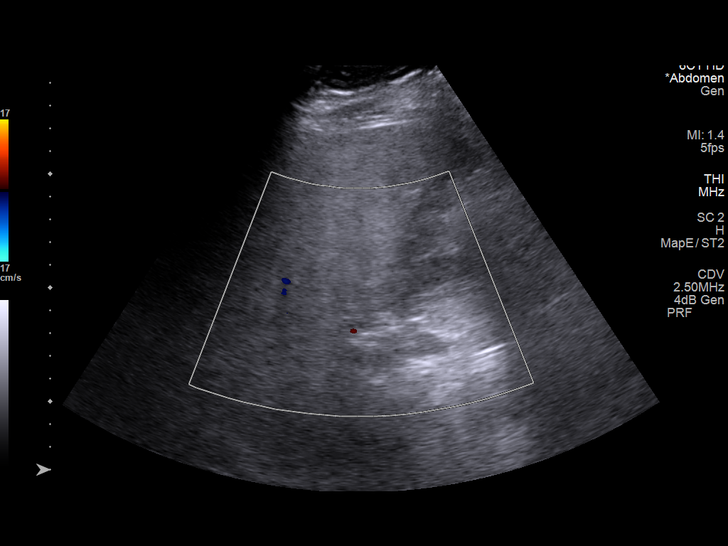
[im 10/38]
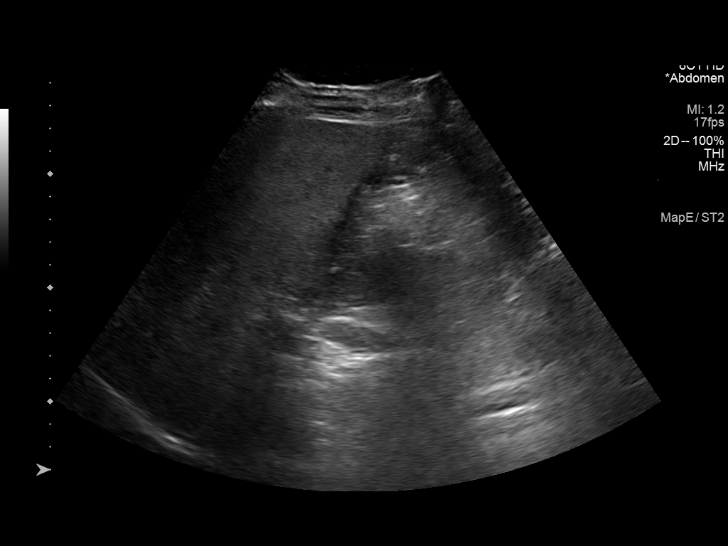
[im 13/38]
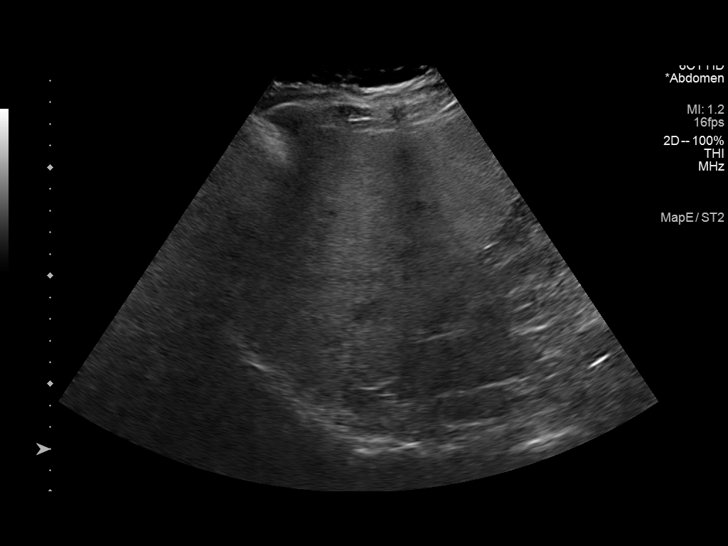
[im 14/38]
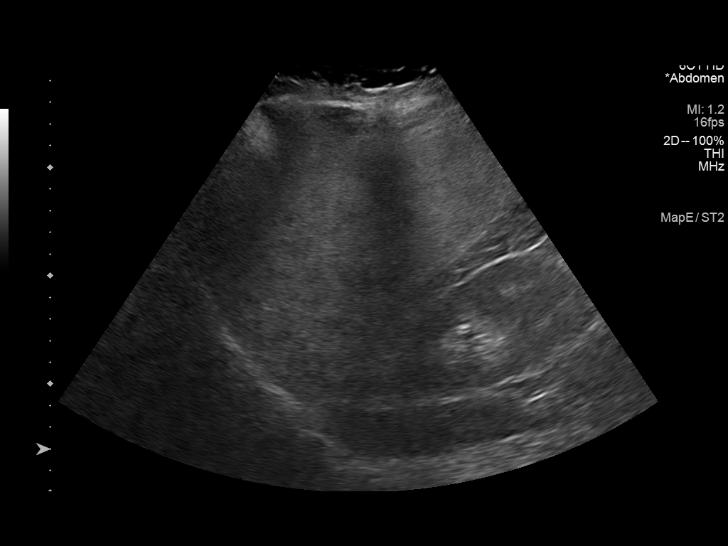
[im 17/38]
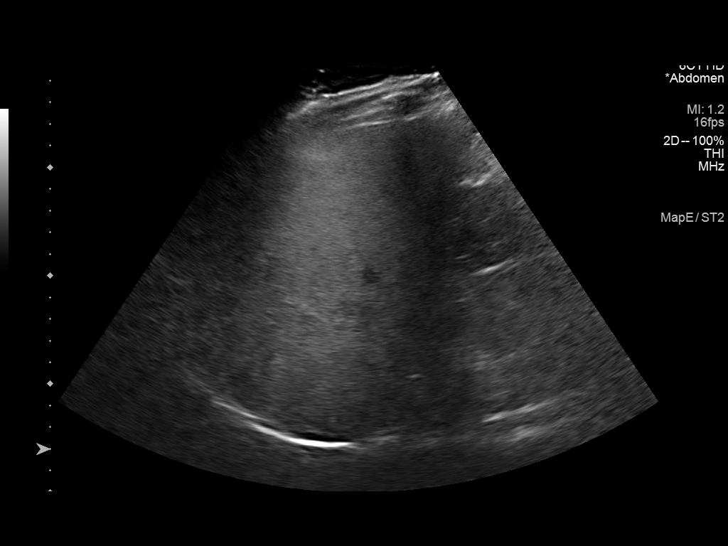
[im 21/38]
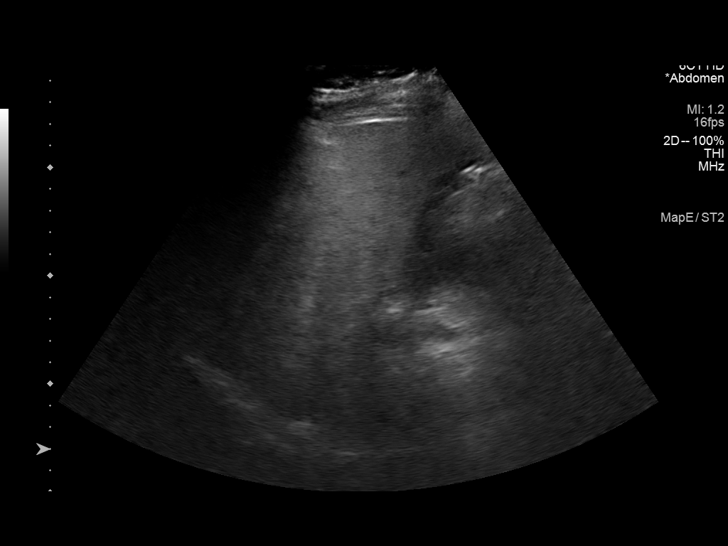
[im 24/38]
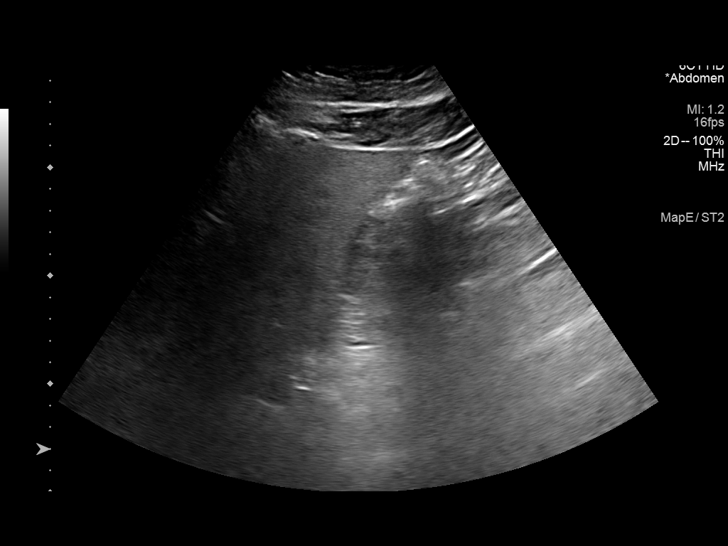
[im 25/38]
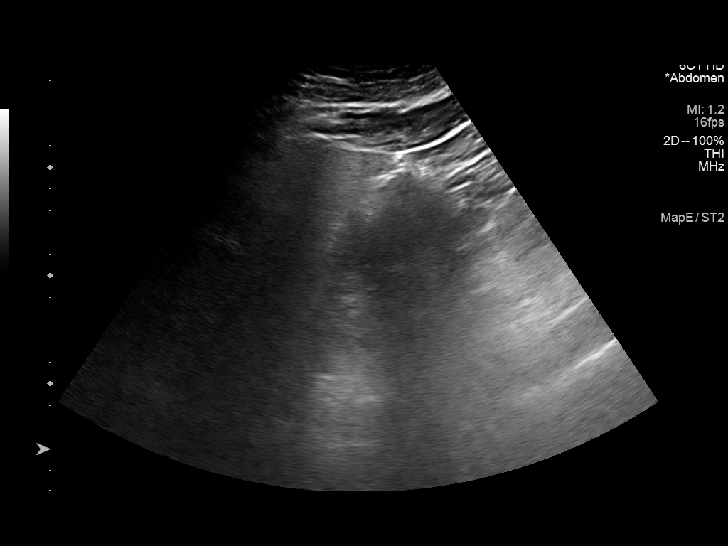
[im 28/38]
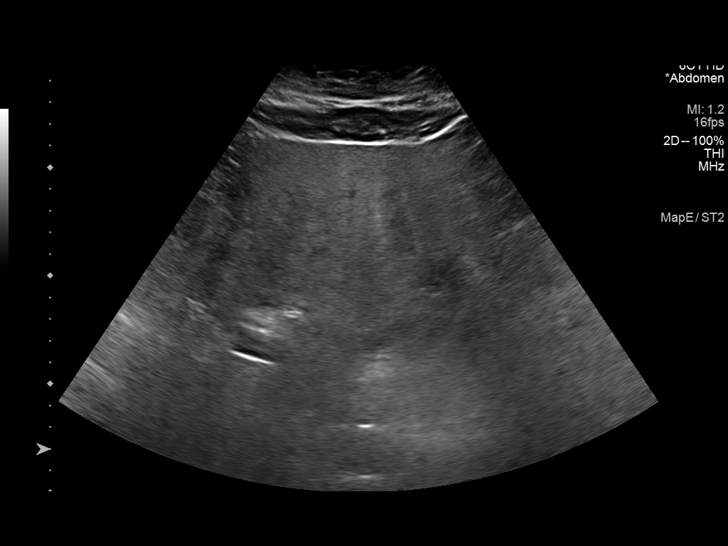
[im 31/38]
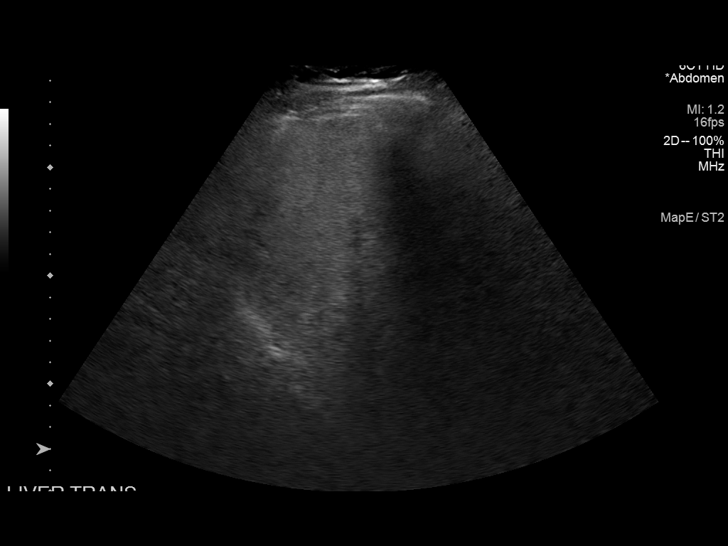
[im 34/38]
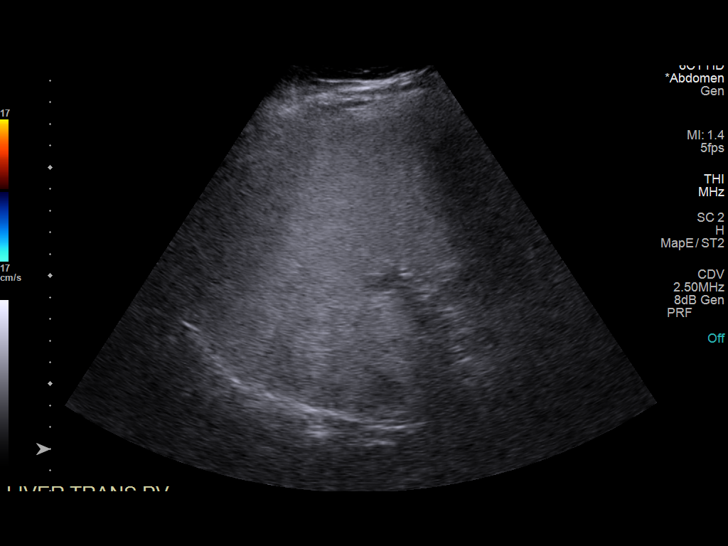
[im 38/38]
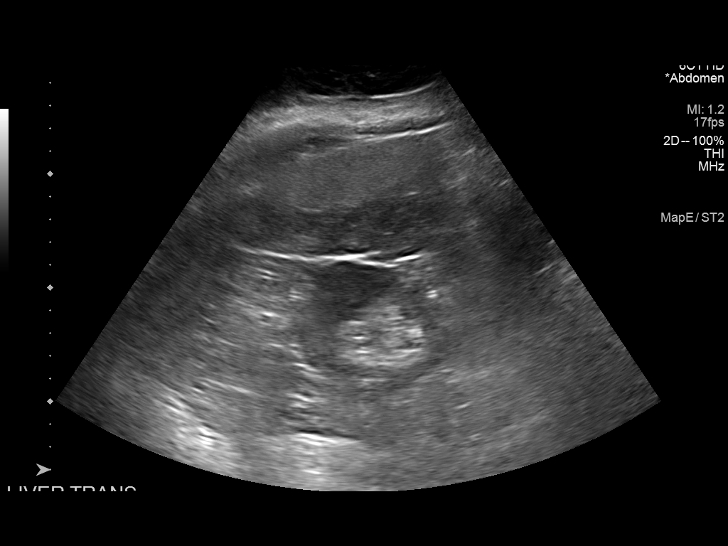

[14 of 25 positions shown; findings below may reference images not displayed]

FINDINGS: Gallbladder:

Surgically absent

Common bile duct:

Diameter: The central common bile duct measures 2.8 mm in the
extrahepatic bile duct appears to measure up to 8.2 mm. Evaluation
of the common bile duct is limited however.

Liver:

Heterogeneous increased echogenicity. No liver mass. Portal vein is
patent on color Doppler imaging with normal direction of blood flow
towards the liver.

Other: None.
IMPRESSION: 1. Evaluation of the common bile duct is limited. The extrahepatic
common bile duct may measure up to 8.2 mm which is within normal
limits after cholecystectomy.
2. Increased heterogeneous echotexture of the liver is nonspecific
but often due to hepatic steatosis. No liver mass identified.

## 2023-11-08 ENCOUNTER — Ambulatory Visit: Attending: Podiatry | Admitting: Physical Therapy

## 2023-11-08 ENCOUNTER — Encounter: Payer: Self-pay | Admitting: Physical Therapy

## 2023-11-08 DIAGNOSIS — R2689 Other abnormalities of gait and mobility: Secondary | ICD-10-CM | POA: Diagnosis present

## 2023-11-08 DIAGNOSIS — S93432D Sprain of tibiofibular ligament of left ankle, subsequent encounter: Secondary | ICD-10-CM | POA: Insufficient documentation

## 2023-11-08 DIAGNOSIS — M25572 Pain in left ankle and joints of left foot: Secondary | ICD-10-CM | POA: Insufficient documentation

## 2023-11-08 DIAGNOSIS — M6281 Muscle weakness (generalized): Secondary | ICD-10-CM | POA: Insufficient documentation

## 2023-11-08 DIAGNOSIS — M25372 Other instability, left ankle: Secondary | ICD-10-CM | POA: Insufficient documentation

## 2023-11-08 DIAGNOSIS — M25872 Other specified joint disorders, left ankle and foot: Secondary | ICD-10-CM | POA: Diagnosis not present

## 2023-11-15 NOTE — Progress Notes (Unsigned)
  Electrophysiology Office Note:   Date:  11/16/2023  ID:  Lisa Mosley, DOB [birth year], MRN [id number]  Primary Cardiologist: Lonni Cash, MD Primary Heart Failure: None Electrophysiologist: Mohd. Derflinger Gladis Norton, MD      History of Present Illness:   Lisa Mosley is a 48 y.o. female with h/o PVCs, hypertension seen today for routine electrophysiology followup.   Since last being seen in our clinic the patient reports doing well.  She has no palpitations or shortness of breath.  She is unaware of further PVCs.  She is happy with her control.  She recently had ankle surgery on a ligament.  She fractured her ankle prior to needing the specific surgery.  She is in a walking boot.  She has to get back to exercise when she is fully recovered..  she denies chest pain, palpitations, dyspnea, PND, orthopnea, nausea, vomiting, dizziness, syncope, edema, weight gain, or early satiety.   Review of systems complete and found to be negative unless listed in HPI.   EP Information / Studies Reviewed:    EKG is ordered today. Personal review as below.  EKG Interpretation Date/Time:  Wednesday November 16 2023 09:46:42 EDT Ventricular Rate:  72 PR Interval:  150 QRS Duration:  88 QT Interval:  386 QTC Calculation: 422 R Axis:   71  Text Interpretation: Normal sinus rhythm Normal ECG When compared with ECG of 10-Dec-2022 08:16, No significant change was found Confirmed by Wiatt Mahabir ([id number]) on 11/16/2023 9:58:19 AM     Risk Assessment/Calculations:            Physical Exam:   VS:  BP 114/80 (BP Location: Right Arm, Patient Position: Sitting, Cuff Size: Normal)   Pulse 72   Ht 5' 6 (1.676 m)   Wt 153 lb (69.4 kg)   SpO2 96%   BMI 24.69 kg/m    Wt Readings from Last 3 Encounters:  11/16/23 153 lb (69.4 kg)  10/20/23 150 lb (68 kg)  09/27/23 150 lb (68 kg)     GEN: Well nourished, well developed in no acute distress NECK: No JVD; No carotid bruits CARDIAC: Regular rate and  rhythm, no murmurs, rubs, gallops RESPIRATORY:  Clear to auscultation without rales, wheezing or rhonchi  ABDOMEN: Soft, non-tender, non-distended EXTREMITIES:  No edema; No deformity   ASSESSMENT AND PLAN:    1.  PVCs: Cardiac monitor with a 25% burden.  On diltiazem  and flecainide .  No further PVCs.  Doing well without acute complaint.  Damisha Wolff continue with current management.  2.  Hypertension: Well-controlled  Follow up with EP APP in 12 months  Signed, Zenas Santa Gladis Norton, MD

## 2023-11-16 ENCOUNTER — Ambulatory Visit: Admitting: Physical Therapy

## 2023-11-16 ENCOUNTER — Encounter: Payer: Self-pay | Admitting: Cardiology

## 2023-11-16 ENCOUNTER — Ambulatory Visit: Attending: Cardiology | Admitting: Cardiology

## 2023-11-16 ENCOUNTER — Encounter: Payer: Self-pay | Admitting: Physical Therapy

## 2023-11-16 VITALS — BP 114/80 | HR 72 | Ht 66.0 in | Wt 153.0 lb

## 2023-11-16 DIAGNOSIS — R2689 Other abnormalities of gait and mobility: Secondary | ICD-10-CM

## 2023-11-16 DIAGNOSIS — M6281 Muscle weakness (generalized): Secondary | ICD-10-CM

## 2023-11-16 DIAGNOSIS — M25572 Pain in left ankle and joints of left foot: Secondary | ICD-10-CM | POA: Diagnosis not present

## 2023-11-16 DIAGNOSIS — I10 Essential (primary) hypertension: Secondary | ICD-10-CM | POA: Diagnosis not present

## 2023-11-16 DIAGNOSIS — I493 Ventricular premature depolarization: Secondary | ICD-10-CM

## 2023-11-16 NOTE — Therapy (Signed)
 OUTPATIENT PHYSICAL THERAPY DAILY NOTE   Patient Name: Lisa Mosley MRN: [id number] DOB:October 15, 1975, 48 y.o., female Today's Date: 11/16/2023  END OF SESSION:  PT End of Session - 11/16/23 1145     Visit Number 2    Number of Visits 16    Date for PT Re-Evaluation 01/03/24    Authorization Type [employer]    PT Start Time 1145    PT Stop Time 1226    PT Time Calculation (min) 41 min    Activity Tolerance Patient limited by pain;Patient tolerated treatment well    Behavior During Therapy WFL for tasks assessed/performed          Past Medical History:  Diagnosis Date   HTN (hypertension)    Migraines    Nephrolithiasis    PVC (premature ventricular contraction)    Stomach tumor (benign)    Past Surgical History:  Procedure Laterality Date   ABDOMINAL HYSTERECTOMY     APPENDECTOMY     CHOLECYSTECTOMY     HERNIA REPAIR     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)     TUBAL LIGATION     Now reversed   Patient Active Problem List   Diagnosis Date Noted   PVC's (premature ventricular contractions) 07/13/2017   Fatigue 07/13/2017   Hypertension 07/13/2017   Severe dysplasia of cervix 04/17/2015    PCP: Leonel Cole, MD   REFERRING PROVIDER:  OUTPATIENT PHYSICAL THERAPY LOWER EXTREMITY EVALUATION   Patient Name: Lisa Mosley MRN: [id number] DOB:1975/11/30, 48 y.o., female Today's Date: 11/08/2023  END OF SESSION:  PT End of Session - 11/08/23 1450     Visit Number 1    Number of Visits 16    Date for PT Re-Evaluation 01/03/24    Authorization Type [employer]    PT Start Time 0800    PT Stop Time 0845    PT Time Calculation (min) 45 min    Activity Tolerance Patient limited by pain;Patient tolerated treatment well    Behavior During Therapy WFL for tasks assessed/performed          Past Medical History:  Diagnosis Date   HTN (hypertension)    Migraines    Nephrolithiasis    PVC (premature ventricular  contraction)    Stomach tumor (benign)    Past Surgical History:  Procedure Laterality Date   ABDOMINAL HYSTERECTOMY     APPENDECTOMY     CHOLECYSTECTOMY     HERNIA REPAIR     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)     TUBAL LIGATION     Now reversed   Patient Active Problem List   Diagnosis Date Noted   PVC's (premature ventricular contractions) 07/13/2017   Fatigue 07/13/2017   Hypertension 07/13/2017   Severe dysplasia of cervix 04/17/2015    PCP: Leonel Cole, MD   REFERRING PROVIDER: Silva Juliene SAUNDERS, DPM   REFERRING DIAG:  [phone] (ICD-10-CM) - Ankle instability, left  M25.872 (ICD-10-CM) - Impingement syndrome of left ankle  S93.432D (ICD-10-CM) - Ankle syndesmosis disruption, left, subsequent encounter    THERAPY DIAG:  Pain in left ankle and joints of left foot  Other abnormalities of gait and mobility  Muscle weakness (generalized)  Rationale for Evaluation and Treatment: Rehabilitation  ONSET DATE: surgery 10-14-23  SUBJECTIVE:   SUBJECTIVE STATEMENT:  11/16/2023: Pt reports that she has been doing well overall.  A little achy but no significant pain.  EVAL: Pt accompanied by spouse to evaluation.Pt has been seen in  clinic formerly for L distal fibular fx with avulsion of medial malleolus after fall 02-01-23. I had surgery on 10-14-23 and I was here in clinic in January 2025. I broke my ankle in October 2024 and was never painfree. I had surgery and had ligament repaired and now I am in PT. I would like to be able to walk my dog, boxer pit bull mix and eventually running.  I would also like to return to strength training. I was sometimes at [employer] and [facility] at [place]. 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 I am non wt bearing and I am not doing household chores  PERTINENT HISTORY: Stomach tumor removed benign, PVC's, osteopenia, bariatric surgery PAIN:  Are you having pain? Yes: NPRS scale: at rest 2/10  and at worst  5/10 Pain location: Left and Right lateral ankle where incisions are. Pain description: achy Aggravating factors: trying to stretch too much in Dorsiflexion, not standing for longer than 5 minutes Relieving factors: ice and elevate leg and use PRICE Aggravating factors- cat lies on it initially   PRECAUTIONS: Other: WBAT L in CAM walking boot  may be full weightbearing as tolerated with range of motion   RED FLAGS: None   WEIGHT BEARING RESTRICTIONS: Yes WBAT L  FALLS:  Has patient fallen in last 6 months? Yes. Number of falls 5 x  while using crutches NWB L  LIVING ENVIRONMENT: Lives with: lives with their family Lives in: House/apartment Stairs: 4 steps to porch and inside but bedroom on main floor Has following equipment at home: Single point cane, Crutches, Wheelchair (manual), and Tour manager  OCCUPATION: High school Teacher English  PLOF: Independent  PATIENT GOALS: decrease pain in left foot and be able to return to walking dog  NEXT MD VISIT: TBD  OBJECTIVE:  Note: Objective measures were completed at Evaluation unless otherwise noted.  DIAGNOSTIC FINDINGS: See medical record  PATIENT SURVEYS:  LEFS  Extreme difficulty/unable (0), Quite a bit of difficulty (1), Moderate difficulty (2), Little difficulty (3), No difficulty (4) Survey date:    11-08-23  Any of your usual work, housework or school activities     0  2. Usual hobbies, recreational or sporting activities      0  3. Getting into/out of the bath      1  4. Walking between rooms       0  5. Putting on socks/shoes       4  6. Squatting       0  7. Lifting an object, like a bag of groceries from the floor       1  8. Performing light activities around your home       1  9. Performing heavy activities around your home       1  10. Getting into/out of a car       2  11. Walking 2 blocks       0  12. Walking 1 mile       0  13. Going up/down 10 stairs (1 flight)       0  14. Standing for 1 hour        0  15.  sitting for 1 hour        4  16. Running on even ground       0  17. Running on uneven ground        0  18. Making sharp turns while running fast  0  19. Hopping         0  20. Rolling over in bed       4  Score total:  18/80  (22.5 %)     COGNITION: Overall cognitive status: Within functional limits for tasks assessed     SENSATION: WFL  EDEMA:  Figure 8: Right 49 cm  L 51 cm    POSTURE: rounded shoulders, forward head, and weight shift right  PALPATION: TTP to bil ankle malleous and along L fibularis longus and brevis  LOWER EXTREMITY ROM:  Active ROM Right eval Left eval  Hip flexion    Hip extension    Hip abduction    Hip adduction    Hip internal rotation    Hip external rotation    Knee flexion 130 132  Knee extension    Ankle dorsiflexion 15 1  Ankle plantarflexion 55 49  Ankle inversion 40 29  Ankle eversion 35 22   (Blank rows = not tested)  LOWER EXTREMITY MMT:  Left side not tested due to post surgery  MMT Right eval Left eval  Hip flexion 5   Hip extension    Hip abduction    Hip adduction    Hip internal rotation    Hip external rotation    Knee flexion 5   Knee extension 5   Ankle dorsiflexion 5 3-  Ankle plantarflexion 4 3-  Ankle inversion 5 3-  Ankle eversion 5 3-   (Blank rows = not tested)  LOWER EXTREMITY SPECIAL TESTS:  NT due to post surgery FUNCTIONAL TESTS:  5 times sit to stand: 9.73 sec 2 minute walk test: TBD     SL stance Left 0 sec  Right  30 sec GAIT: Distance walked: 300 ft Assistive device utilized: Crutches WBAT L Level of assistance: Modified independence Comments: Pt with better balance with WBAT L and able to execute without difficulty.  Pt with decreased gait speed and wt bear to right                                                                                                                                 TREATMENT DATE:   [facility] Adult PT Treatment  11/16/2023:  Therapeutic  Exercise:  Bike - L2 - 5 min Towel scrunch - 1' x2 Ankle inv/eversion with towel - 1' x2 DF heel slide in sitting  Seated achilles stretch - 1' x2  Neuromuscular re-ed: BAPS board L4 - 20x ea direction  Therapeutic Activity  w/s lateral and staggered sagittal    HOME EXERCISE PROGRAM: Access Code: [email or web address] URL: [email or web address] Date: 11/08/2023 Prepared by: Graydon Dingwall  Exercises - Supine Ankle Dorsiflexion and Plantarflexion AROM  - 1 x daily - 7 x weekly - 3 sets - 10 reps - Supine Ankle Inversion and Eversion AROM  - 1 x daily - 7 x weekly - 3 sets - 10 reps - Supine Ankle Circles  -  1 x daily - 7 x weekly - 3 sets - 10 reps - Standing Weight Shift Side to Side  - 1 x daily - 7 x weekly - 3 sets - 10 reps - Standing Weight Shifting Forward and Backward  - 1 x daily - 7 x weekly - 3 sets - 10 reps - Staggered Stance Step through Nathanel on Crutches  - 1 x daily - 7 x weekly - 3 sets - 10 reps  ASSESSMENT:  CLINICAL IMPRESSION:  11/16/2023:  Nathanel tolerated session well with no adverse reaction.  Concentrated on ankle mobility and control.  Pt does well with BAPS board in sagittal plane with significantly reduced stability in frontal plane.  Minimal discomfort with w/b with some stretch felt wit DF.  EVAL: Patient is a 48 y.o. female who was seen today for physical therapy evaluation and treatment for Left ankle arthroscopy/syndesmosis repair now WBAT L in CAM boot and now beginning WBAT L today. Pt reports falling with crutches with lack of balance due to NWB status. Pt seems to be more steady post session with new WB status. Pt LEFS score shows decrease in functional ability below PLOF. Pt would benefit from skilled PT to address impairments, decrease pain and return to WNL ankle ROM and improving gait and balance to return to walking dog and safe mobility for home and work  OBJECTIVE IMPAIRMENTS: decreased activity tolerance, decreased balance, decreased  knowledge of condition, decreased knowledge of use of DME, decreased mobility, difficulty walking, decreased ROM, decreased strength, and pain.   ACTIVITY LIMITATIONS: carrying, lifting, bending, standing, squatting, stairs, transfers, bed mobility, and locomotion level  PARTICIPATION LIMITATIONS: meal prep, cleaning, laundry, driving, shopping, community activity, and occupation  PERSONAL FACTORS: Stomach tumor removed benign, PVC's, osteopenia, bariatric surgery are also affecting patient's functional outcome.   REHAB POTENTIAL: Excellent  CLINICAL DECISION MAKING: Stable/uncomplicated  EVALUATION COMPLEXITY: Low   GOALS: Goals reviewed with patient? Yes  SHORT TERM GOALS: Target date: 12-06-23 Pt will be compliant and knowledgeable with initial HEP for improved comfort and carryover and care post op Baseline:limited knowledge Goal status: INITIAL  2.  Pt will self report left ankle pain not greater than 4/10 for improved comfort and functional mobility in CAM boot with WBAT L Baseline: WBAT L  5/10 Goal status: INITIAL  3.  Pt will be able to wean to a normal tennis show with minimal pain Baseline: In CAM walking boot Goal status: INITIAL    LONG TERM GOALS: Target date: 01-26-24  I with advanced HEP Baseline: limited knowledge Goal status: INITIAL  2.  Pt will walk dog for 1 mile with LRAD with minimal pain 2/10 or less Baseline: on crutches and WBAT L Goal status: INITIAL  3.   Pt will improve L ankle DF ROM to no less than 10 degrees for improved ankle mobility and gait  Baseline: Left DF 1 degree Goal status: INITIAL  4.  Pt LEFS will improve to at least 36/80 to show improved functional change over 8 weeks Baseline: 18/80 Goal status: INITIAL  5.  Pt will be able to perform standing SL heel raise on LT 15/25 x to show improved tolerance and strength  Baseline: 0/25  Goal Status INITIAL  6. Pt will be able to perform household chores with minimal pain for 30  minutes  Baseline: unable to stand for more than 5 minutes  Goal Status INITIAL   PLAN:  PT FREQUENCY: 1-2x/week  PT DURATION: 8 weeks  PLANNED INTERVENTIONS:  02835- PT Re-evaluation, 97750- Physical Performance Testing, 97110-Therapeutic exercises, 97530- Therapeutic activity, [id number]- Neuromuscular re-education, [id number]- Self Care, 02859- Manual therapy, [phone]- Gait training, [phone]- Aquatic Therapy, [phone]- Electrical stimulation (manual), Patient/Family education, Balance training, Stair training, Taping, Joint mobilization, Spinal mobilization, DME instructions, Cryotherapy, and Moist heat  PLAN FOR NEXT SESSION: Gait training, progress HEP as pt tolerates. WBAT LITTIE Graydon Dingwall, PT, ATRIC Certified Exercise Expert for the Aging Adult  11/08/23 2:51 PM Phone: [phone] Fax: [phone]   REFERRING DIAG:  M25.372 (ICD-10-CM) - Ankle instability, left  M25.872 (ICD-10-CM) - Impingement syndrome of left ankle  S93.432D (ICD-10-CM) - Ankle syndesmosis disruption, left, subsequent encounter    THERAPY DIAG:  Pain in left ankle and joints of left foot  Other abnormalities of gait and mobility  Muscle weakness (generalized)  Rationale for Evaluation and Treatment: Rehabilitation  ONSET DATE: surgery 10-14-23  SUBJECTIVE:   SUBJECTIVE STATEMENT: Pt accompanied by spouse to evaluation.Pt has been seen in clinic formerly for L distal fibular fx with avulsion of medial malleolus after fall 02-01-23. I had surgery on 10-14-23 and I was here in clinic in January 2025. I broke my ankle in October 2024 and was never painfree. I had surgery and had ligament repaired and now I am in PT. I would like to be able to walk my dog, boxer pit bull mix and eventually running.  I would also like to return to strength training. 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 I am non wt bearing and I am not doing household chores  PERTINENT  HISTORY: Stomach tumor removed benign, PVC's, osteopenia, bariatric surgery PAIN:  Are you having pain? Yes: NPRS scale: at rest 2/10  and at worst 5/10 Pain location: Left and Right lateral ankle where incisions are. Pain description: achy Aggravating factors: trying to stretch too much in Dorsiflexion, not standing for longer than 5 minutes Relieving factors: ice and elevate leg and use PRICE Aggravating factors- cat lies on it initially   PRECAUTIONS: Other: WBAT L in CAM walking boot  RED FLAGS: None   WEIGHT BEARING RESTRICTIONS: Yes WBAT L  FALLS:  Has patient fallen in last 6 months? Yes. Number of falls 5 x  while using crutches NWB L  LIVING ENVIRONMENT: Lives with: lives with their family Lives in: House/apartment Stairs: 4 steps to porch and inside but bedroom on main floor Has following equipment at home: Single point cane, Crutches, Wheelchair (manual), and Tour manager  OCCUPATION: High school Teacher English  PLOF: Independent  PATIENT GOALS: decrease pain in left foot and be able to return to walking dog  NEXT MD VISIT: TBD  OBJECTIVE:  Note: Objective measures were completed at Evaluation unless otherwise noted.  DIAGNOSTIC FINDINGS: See medical record  PATIENT SURVEYS:  LEFS  Extreme difficulty/unable (0), Quite a bit of difficulty (1), Moderate difficulty (2), Little difficulty (3), No difficulty (4) Survey date:    11-08-23  Any of your usual work, housework or school activities     0  2. Usual hobbies, recreational or sporting activities      0  3. Getting into/out of the bath      1  4. Walking between rooms       0  5. Putting on socks/shoes       4  6. Squatting       0  7. Lifting an object, like a bag of groceries from the floor  1  8. Performing light activities around your home       1  9. Performing heavy activities around your home       1  10. Getting into/out of a car       2  11. Walking 2 blocks       0  12. Walking 1 mile        0  13. Going up/down 10 stairs (1 flight)       0  14. Standing for 1 hour       0  15.  sitting for 1 hour        4  16. Running on even ground       0  17. Running on uneven ground        0  18. Making sharp turns while running fast        0  19. Hopping         0  20. Rolling over in bed       4  Score total:  18/80  (22.5 %)     COGNITION: Overall cognitive status: Within functional limits for tasks assessed     SENSATION: WFL  EDEMA:  Figure 8: Right 49 cm  L 51 cm    POSTURE: rounded shoulders, forward head, and weight shift right  PALPATION: TTP to bil ankle malleous and along L fibularis longus and brevis  LOWER EXTREMITY ROM:  Active ROM Right eval Left eval  Hip flexion    Hip extension    Hip abduction    Hip adduction    Hip internal rotation    Hip external rotation    Knee flexion 130 132  Knee extension    Ankle dorsiflexion 15 1  Ankle plantarflexion 55 49  Ankle inversion 40 29  Ankle eversion 35 22   (Blank rows = not tested)  LOWER EXTREMITY MMT:  Left side not tested due to post surgery  MMT Right eval Left eval  Hip flexion 5   Hip extension    Hip abduction    Hip adduction    Hip internal rotation    Hip external rotation    Knee flexion 5   Knee extension 5   Ankle dorsiflexion 5 3-  Ankle plantarflexion 4 3-  Ankle inversion 5 3-  Ankle eversion 5 3-   (Blank rows = not tested)  LOWER EXTREMITY SPECIAL TESTS:  NT due to post surgery FUNCTIONAL TESTS:  5 times sit to stand: 9.73 sec 2 minute walk test: TBD     SL stance Left 0 sec  Right  30 sec GAIT: Distance walked: 300 ft Assistive device utilized: Crutches WBAT L Level of assistance: Modified independence Comments: Pt with better balance with WBAT L and able to execute without difficulty.  Pt with decreased gait speed and wt bear to right  TREATMENT DATE: EVAL  and HEP    PATIENT EDUCATION:  Education details: POC, Explanation of findings, PRICE care of post surgical incision/ post op care, WBAT L and issue HEP Person educated: Patient and Spouse Education method: Explanation, Demonstration, Tactile cues, Verbal cues, and Handouts Education comprehension: verbalized understanding, returned demonstration, verbal cues required, tactile cues required, and needs further education  HOME EXERCISE PROGRAM: Access Code: [email or web address] URL: [email or web address] Date: 11/08/2023 Prepared by: Graydon Dingwall  Exercises - Supine Ankle Dorsiflexion and Plantarflexion AROM  - 1 x daily - 7 x weekly - 3 sets - 10 reps - Supine Ankle Inversion and Eversion AROM  - 1 x daily - 7 x weekly - 3 sets - 10 reps - Supine Ankle Circles  - 1 x daily - 7 x weekly - 3 sets - 10 reps - Standing Weight Shift Side to Side  - 1 x daily - 7 x weekly - 3 sets - 10 reps - Standing Weight Shifting Forward and Backward  - 1 x daily - 7 x weekly - 3 sets - 10 reps - Staggered Stance Step through [employer] on Crutches  - 1 x daily - 7 x weekly - 3 sets - 10 reps  ASSESSMENT:  CLINICAL IMPRESSION: Patient is a 48 y.o. female who was seen today for physical therapy evaluation and treatment for Left ankle arthroscopy/syndesmosis repair now WBAT L in CAM boot and now beginning WBAT L today. Pt reports falling with crutches with lack of balance due to NWB status. Pt seems to be more steady post session with new WB status. Pt LEFS score shows decrease in functional ability below PLOF. Pt would benefit from skilled PT to address impairments, decrease pain and return to WNL ankle ROM and improving gait and balance to return to walking dog and safe mobility for home and work  OBJECTIVE IMPAIRMENTS: decreased activity tolerance, decreased balance, decreased knowledge of condition, decreased knowledge of use of DME, decreased mobility, difficulty walking,  decreased ROM, decreased strength, and pain.   ACTIVITY LIMITATIONS: carrying, lifting, bending, standing, squatting, stairs, transfers, bed mobility, and locomotion level  PARTICIPATION LIMITATIONS: meal prep, cleaning, laundry, driving, shopping, community activity, and occupation  PERSONAL FACTORS: Stomach tumor removed benign, PVC's, osteopenia, bariatric surgery are also affecting patient's functional outcome.   REHAB POTENTIAL: Excellent  CLINICAL DECISION MAKING: Stable/uncomplicated  EVALUATION COMPLEXITY: Low   GOALS: Goals reviewed with patient? Yes  SHORT TERM GOALS: Target date: 12-06-23 Pt will be compliant and knowledgeable with initial HEP for improved comfort and carryover and care post op Baseline:limited knowledge Goal status: INITIAL  2.  Pt will self report left ankle pain not greater than 4/10 for improved comfort and functional mobility in CAM boot with WBAT L Baseline: WBAT L  5/10 Goal status: INITIAL  3.  Pt will be able to wean to a normal tennis show with minimal pain Baseline: In CAM walking boot Goal status: INITIAL    LONG TERM GOALS: Target date: 01-26-24  I with advanced HEP Baseline: limited knowledge Goal status: INITIAL  2.  Pt will walk dog for 1 mile with LRAD with minimal pain 2/10 or less Baseline: on crutches and WBAT L Goal status: INITIAL  3.   Pt will improve L ankle DF ROM to no less than 10 degrees for improved ankle mobility and gait  Baseline: Left DF 1 degree Goal status: INITIAL  4.  Pt LEFS will improve to at least 36/80 to show improved functional change over 8  weeks Baseline: 18/80 Goal status: INITIAL  5.  Pt will be able to perform standing SL heel raise on LT 15/25 x to show improved tolerance and strength  Baseline: 0/25  Goal Status INITIAL  6. Pt will be able to perform household chores with minimal pain for 30 minutes  Baseline: unable to stand for more than 5 minutes  Goal Status INITIAL   PLAN:  PT  FREQUENCY: 1-2x/week  PT DURATION: 8 weeks  PLANNED INTERVENTIONS: 97164- PT Re-evaluation, 97750- Physical Performance Testing, 97110-Therapeutic exercises, 97530- Therapeutic activity, [id number]- Neuromuscular re-education, 97535- Self Care, 02859- Manual therapy, [id number]- Gait training, [phone]- Aquatic Therapy, [phone]- Electrical stimulation (manual), Patient/Family education, Balance training, Stair training, Taping, Joint mobilization, Spinal mobilization, DME instructions, Cryotherapy, and Moist heat  PLAN FOR NEXT SESSION: Gait training, progress HEP as pt tolerates. SHERRYLL LITTIE Helene FORBES Keyshla Tunison PT 11/16/23 12:38 PM Phone: [phone] Fax: [phone]

## 2023-11-17 ENCOUNTER — Other Ambulatory Visit: Payer: Self-pay | Admitting: Family Medicine

## 2023-11-17 DIAGNOSIS — N6310 Unspecified lump in the right breast, unspecified quadrant: Secondary | ICD-10-CM

## 2023-11-18 ENCOUNTER — Ambulatory Visit: Admitting: Physical Therapy

## 2023-11-18 ENCOUNTER — Encounter: Payer: Self-pay | Admitting: Physical Therapy

## 2023-11-18 DIAGNOSIS — M6281 Muscle weakness (generalized): Secondary | ICD-10-CM

## 2023-11-18 DIAGNOSIS — M25572 Pain in left ankle and joints of left foot: Secondary | ICD-10-CM

## 2023-11-18 DIAGNOSIS — R2689 Other abnormalities of gait and mobility: Secondary | ICD-10-CM

## 2023-11-18 NOTE — Therapy (Signed)
 OUTPATIENT PHYSICAL THERAPY DAILY NOTE   Patient Name: Lisa Mosley MRN: [id number] DOB:1976/03/14, 48 y.o., female Today's Date: 11/18/2023  END OF SESSION:  PT End of Session - 11/18/23 0717     Visit Number 3    Number of Visits 16    Date for PT Re-Evaluation 01/03/24    Authorization Type [employer]    PT Start Time 0715    PT Stop Time 0756    PT Time Calculation (min) 41 min    Activity Tolerance Patient limited by pain;Patient tolerated treatment well    Behavior During Therapy WFL for tasks assessed/performed          Past Medical History:  Diagnosis Date   HTN (hypertension)    Migraines    Nephrolithiasis    PVC (premature ventricular contraction)    Stomach tumor (benign)    Past Surgical History:  Procedure Laterality Date   ABDOMINAL HYSTERECTOMY     APPENDECTOMY     CHOLECYSTECTOMY     HERNIA REPAIR     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)     TUBAL LIGATION     Now reversed   Patient Active Problem List   Diagnosis Date Noted   PVC's (premature ventricular contractions) 07/13/2017   Fatigue 07/13/2017   Hypertension 07/13/2017   Severe dysplasia of cervix 04/17/2015    PCP: Leonel Cole, MD    PCP: Leonel Cole, MD   REFERRING PROVIDER: Silva Juliene SAUNDERS, DPM   REFERRING DIAG:  [phone] (ICD-10-CM) - Ankle instability, left  M25.872 (ICD-10-CM) - Impingement syndrome of left ankle  S93.432D (ICD-10-CM) - Ankle syndesmosis disruption, left, subsequent encounter    THERAPY DIAG:  Pain in left ankle and joints of left foot  Other abnormalities of gait and mobility  Muscle weakness (generalized)  Rationale for Evaluation and Treatment: Rehabilitation  ONSET DATE: surgery 10-14-23  SUBJECTIVE:   SUBJECTIVE STATEMENT:  11/18/2023: Pt reports some soreness the day following therapy but her pain is now back to baseline.  EVAL: Pt accompanied by spouse to evaluation.Pt has been seen in clinic  formerly for L distal fibular fx with avulsion of medial malleolus after fall 02-01-23. I had surgery on 10-14-23 and I was here in clinic in January 2025. I broke my ankle in October 2024 and was never painfree. I had surgery and had ligament repaired and now I am in PT. I would like to be able to walk my dog, boxer pit bull mix and eventually running.  I would also like to return to strength training. I was sometimes at [employer] and [facility] [place]. 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 I am non wt bearing and I am not doing household chores  PERTINENT HISTORY: Stomach tumor removed benign, PVC's, osteopenia, bariatric surgery PAIN:  Are you having pain? Yes: NPRS scale: at rest 2/10  and at worst 5/10 Pain location: Left and Right lateral ankle where incisions are. Pain description: achy Aggravating factors: trying to stretch too much in Dorsiflexion, not standing for longer than 5 minutes Relieving factors: ice and elevate leg and use PRICE Aggravating factors- cat lies on it initially   PRECAUTIONS: Other: WBAT L in CAM walking boot  may be full weightbearing as tolerated with range of motion   RED FLAGS: None   WEIGHT BEARING RESTRICTIONS: Yes WBAT L  FALLS:  Has patient fallen in last 6 months? Yes. Number of falls 5 x  while using crutches NWB L  LIVING ENVIRONMENT: Lives  with: lives with their family Lives in: House/apartment Stairs: 4 steps to porch and inside but bedroom on main floor Has following equipment at home: Single point cane, Crutches, Wheelchair (manual), and Tour manager  OCCUPATION: High school Teacher English  PLOF: Independent  PATIENT GOALS: decrease pain in left foot and be able to return to walking dog  NEXT MD VISIT: TBD  OBJECTIVE:  Note: Objective measures were completed at Evaluation unless otherwise noted.  DIAGNOSTIC FINDINGS: See medical record  PATIENT SURVEYS:  LEFS  Extreme difficulty/unable (0), Quite a bit of difficulty (1),  Moderate difficulty (2), Little difficulty (3), No difficulty (4) Survey date:    11-08-23  Any of your usual work, housework or school activities     0  2. Usual hobbies, recreational or sporting activities      0  3. Getting into/out of the bath      1  4. Walking between rooms       0  5. Putting on socks/shoes       4  6. Squatting       0  7. Lifting an object, like a bag of groceries from the floor       1  8. Performing light activities around your home       1  9. Performing heavy activities around your home       1  10. Getting into/out of a car       2  11. Walking 2 blocks       0  12. Walking 1 mile       0  13. Going up/down 10 stairs (1 flight)       0  14. Standing for 1 hour       0  15.  sitting for 1 hour        4  16. Running on even ground       0  17. Running on uneven ground        0  18. Making sharp turns while running fast        0  19. Hopping         0  20. Rolling over in bed       4  Score total:  18/80  (22.5 %)     COGNITION: Overall cognitive status: Within functional limits for tasks assessed     SENSATION: WFL  EDEMA:  Figure 8: Right 49 cm  L 51 cm    POSTURE: rounded shoulders, forward head, and weight shift right  PALPATION: TTP to bil ankle malleous and along L fibularis longus and brevis  LOWER EXTREMITY ROM:  Active ROM Right eval Left eval  Hip flexion    Hip extension    Hip abduction    Hip adduction    Hip internal rotation    Hip external rotation    Knee flexion 130 132  Knee extension    Ankle dorsiflexion 15 1  Ankle plantarflexion 55 49  Ankle inversion 40 29  Ankle eversion 35 22   (Blank rows = not tested)  LOWER EXTREMITY MMT:  Left side not tested due to post surgery  MMT Right eval Left eval  Hip flexion 5   Hip extension    Hip abduction    Hip adduction    Hip internal rotation    Hip external rotation    Knee flexion 5   Knee extension 5   Ankle dorsiflexion 5  3-  Ankle plantarflexion 4 3-   Ankle inversion 5 3-  Ankle eversion 5 3-   (Blank rows = not tested)  LOWER EXTREMITY SPECIAL TESTS:  NT due to post surgery FUNCTIONAL TESTS:  5 times sit to stand: 9.73 sec 2 minute walk test: TBD     SL stance Left 0 sec  Right  30 sec GAIT: Distance walked: 300 ft Assistive device utilized: Crutches WBAT L Level of assistance: Modified independence Comments: Pt with better balance with WBAT L and able to execute without difficulty.  Pt with decreased gait speed and wt bear to right                                                                                                                                 TREATMENT DATE:   [facility] Adult PT Treatment  11/18/2023:  Therapeutic Exercise:  Bike - L2 - 5 min Towel scrunch - 1' x2 Ankle inv/eversion with towel - 1' x2 Marble pick up - 1' x2 DF heel slide in sitting Seated heel raise Seated achilles stretch - 1' x2  Neuromuscular re-ed: BAPS board L4 - 20x ea direction  Therapeutic Activity  Standing hip abd in boot - 15x ea Standing hip ext in boot - 15x ea Squat w/ UE support    HOME EXERCISE PROGRAM: Access Code: [email or web address] URL: [email or web address] Date: 11/08/2023 Prepared by: Graydon Dingwall  Exercises - Supine Ankle Dorsiflexion and Plantarflexion AROM  - 1 x daily - 7 x weekly - 3 sets - 10 reps - Supine Ankle Inversion and Eversion AROM  - 1 x daily - 7 x weekly - 3 sets - 10 reps - Supine Ankle Circles  - 1 x daily - 7 x weekly - 3 sets - 10 reps - Standing Weight Shift Side to Side  - 1 x daily - 7 x weekly - 3 sets - 10 reps - Standing Weight Shifting Forward and Backward  - 1 x daily - 7 x weekly - 3 sets - 10 reps - Staggered Stance Step through Nathanel on Crutches  - 1 x daily - 7 x weekly - 3 sets - 10 reps  ASSESSMENT:  CLINICAL IMPRESSION:  11/18/2023:  Nathanel tolerated session well with no adverse reaction.  Continued to concentrate on ankle mobility to good effect.  She shows  significant improvement lateral control with BAPS board today.  Added in some standing general LE and hip strengthening with no issue.  EVAL: Patient is a 48 y.o. female who was seen today for physical therapy evaluation and treatment for Left ankle arthroscopy/syndesmosis repair now WBAT L in CAM boot and now beginning WBAT L today. Pt reports falling with crutches with lack of balance due to NWB status. Pt seems to be more steady post session with new WB status. Pt LEFS score shows decrease in functional ability below PLOF. Pt would benefit from skilled PT to address impairments, decrease pain  and return to WNL ankle ROM and improving gait and balance to return to walking dog and safe mobility for home and work  OBJECTIVE IMPAIRMENTS: decreased activity tolerance, decreased balance, decreased knowledge of condition, decreased knowledge of use of DME, decreased mobility, difficulty walking, decreased ROM, decreased strength, and pain.   ACTIVITY LIMITATIONS: carrying, lifting, bending, standing, squatting, stairs, transfers, bed mobility, and locomotion level  PARTICIPATION LIMITATIONS: meal prep, cleaning, laundry, driving, shopping, community activity, and occupation  PERSONAL FACTORS: Stomach tumor removed benign, PVC's, osteopenia, bariatric surgery are also affecting patient's functional outcome.   REHAB POTENTIAL: Excellent  CLINICAL DECISION MAKING: Stable/uncomplicated  EVALUATION COMPLEXITY: Low   GOALS: Goals reviewed with patient? Yes  SHORT TERM GOALS: Target date: 12-06-23 Pt will be compliant and knowledgeable with initial HEP for improved comfort and carryover and care post op Baseline:limited knowledge Goal status: INITIAL  2.  Pt will self report left ankle pain not greater than 4/10 for improved comfort and functional mobility in CAM boot with WBAT L Baseline: WBAT L  5/10 Goal status: INITIAL  3.  Pt will be able to wean to a normal tennis show with minimal  pain Baseline: In CAM walking boot Goal status: INITIAL    LONG TERM GOALS: Target date: 01-26-24  I with advanced HEP Baseline: limited knowledge Goal status: INITIAL  2.  Pt will walk dog for 1 mile with LRAD with minimal pain 2/10 or less Baseline: on crutches and WBAT L Goal status: INITIAL  3.   Pt will improve L ankle DF ROM to no less than 10 degrees for improved ankle mobility and gait  Baseline: Left DF 1 degree Goal status: INITIAL  4.  Pt LEFS will improve to at least 36/80 to show improved functional change over 8 weeks Baseline: 18/80 Goal status: INITIAL  5.  Pt will be able to perform standing SL heel raise on LT 15/25 x to show improved tolerance and strength  Baseline: 0/25  Goal Status INITIAL  6. Pt will be able to perform household chores with minimal pain for 30 minutes  Baseline: unable to stand for more than 5 minutes  Goal Status INITIAL   PLAN:  PT FREQUENCY: 1-2x/week  PT DURATION: 8 weeks  PLANNED INTERVENTIONS: 97164- PT Re-evaluation, 97750- Physical Performance Testing, 97110-Therapeutic exercises, 97530- Therapeutic activity, [id number]- Neuromuscular re-education, 97535- Self Care, [id number]- Manual therapy, [id number]- Gait training, [phone]- Aquatic Therapy, [phone]- Electrical stimulation (manual), Patient/Family education, Balance training, Stair training, Taping, Joint mobilization, Spinal mobilization, DME instructions, Cryotherapy, and Moist heat  PLAN FOR NEXT SESSION: Gait training, progress HEP as pt tolerates. WBAT LITTIE Graydon Dingwall, PT, ATRIC Certified Exercise Expert for the Aging Adult  11/08/23 2:51 PM Phone: [phone] Fax: [phone]   REFERRING DIAG:  M25.372 (ICD-10-CM) - Ankle instability, left  M25.872 (ICD-10-CM) - Impingement syndrome of left ankle  S93.432D (ICD-10-CM) - Ankle syndesmosis disruption, left, subsequent encounter    THERAPY DIAG:  Pain in left ankle and joints of left foot  Other abnormalities of gait  and mobility  Muscle weakness (generalized)  Rationale for Evaluation and Treatment: Rehabilitation  ONSET DATE: surgery 10-14-23  SUBJECTIVE:   SUBJECTIVE STATEMENT: Pt accompanied by spouse to evaluation.Pt has been seen in clinic formerly for L distal fibular fx with avulsion of medial malleolus after fall 02-01-23. I had surgery on 10-14-23 and I was here in clinic in January 2025. I broke my ankle in October 2024 and was never painfree. I had surgery and had  ligament repaired and now I am in PT. I would like to be able to walk my dog, boxer pit bull mix and eventually running.  I would also like to return to strength training. I was sometimes at [employer] and [facility] [place]. 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 I am non wt bearing and I am not doing household chores  PERTINENT HISTORY: Stomach tumor removed benign, PVC's, osteopenia, bariatric surgery PAIN:  Are you having pain? Yes: NPRS scale: at rest 2/10  and at worst 5/10 Pain location: Left and Right lateral ankle where incisions are. Pain description: achy Aggravating factors: trying to stretch too much in Dorsiflexion, not standing for longer than 5 minutes Relieving factors: ice and elevate leg and use PRICE Aggravating factors- cat lies on it initially   PRECAUTIONS: Other: WBAT L in CAM walking boot  RED FLAGS: None   WEIGHT BEARING RESTRICTIONS: Yes WBAT L  FALLS:  Has patient fallen in last 6 months? Yes. Number of falls 5 x  while using crutches NWB L  LIVING ENVIRONMENT: Lives with: lives with their family Lives in: House/apartment Stairs: 4 steps to porch and inside but bedroom on main floor Has following equipment at home: Single point cane, Crutches, Wheelchair (manual), and Tour manager  OCCUPATION: High school Teacher English  PLOF: Independent  PATIENT GOALS: decrease pain in left foot and be able to return to walking dog  NEXT MD VISIT: TBD  OBJECTIVE:  Note: Objective measures were  completed at Evaluation unless otherwise noted.  DIAGNOSTIC FINDINGS: See medical record  PATIENT SURVEYS:  LEFS  Extreme difficulty/unable (0), Quite a bit of difficulty (1), Moderate difficulty (2), Little difficulty (3), No difficulty (4) Survey date:    11-08-23  Any of your usual work, housework or school activities     0  2. Usual hobbies, recreational or sporting activities      0  3. Getting into/out of the bath      1  4. Walking between rooms       0  5. Putting on socks/shoes       4  6. Squatting       0  7. Lifting an object, like a bag of groceries from the floor       1  8. Performing light activities around your home       1  9. Performing heavy activities around your home       1  10. Getting into/out of a car       2  11. Walking 2 blocks       0  12. Walking 1 mile       0  13. Going up/down 10 stairs (1 flight)       0  14. Standing for 1 hour       0  15.  sitting for 1 hour        4  16. Running on even ground       0  17. Running on uneven ground        0  18. Making sharp turns while running fast        0  19. Hopping         0  20. Rolling over in bed       4  Score total:  18/80  (22.5 %)     COGNITION: Overall cognitive status: Within functional limits for tasks assessed     SENSATION: [facility]  EDEMA:  Figure 8: Right 49 cm  L 51 cm    POSTURE: rounded shoulders, forward head, and weight shift right  PALPATION: TTP to bil ankle malleous and along L fibularis longus and brevis  LOWER EXTREMITY ROM:  Active ROM Right eval Left eval  Hip flexion    Hip extension    Hip abduction    Hip adduction    Hip internal rotation    Hip external rotation    Knee flexion 130 132  Knee extension    Ankle dorsiflexion 15 1  Ankle plantarflexion 55 49  Ankle inversion 40 29  Ankle eversion 35 22   (Blank rows = not tested)  LOWER EXTREMITY MMT:  Left side not tested due to post surgery  MMT Right eval Left eval  Hip flexion 5   Hip extension     Hip abduction    Hip adduction    Hip internal rotation    Hip external rotation    Knee flexion 5   Knee extension 5   Ankle dorsiflexion 5 3-  Ankle plantarflexion 4 3-  Ankle inversion 5 3-  Ankle eversion 5 3-   (Blank rows = not tested)  LOWER EXTREMITY SPECIAL TESTS:  NT due to post surgery FUNCTIONAL TESTS:  5 times sit to stand: 9.73 sec 2 minute walk test: TBD     SL stance Left 0 sec  Right  30 sec GAIT: Distance walked: 300 ft Assistive device utilized: Crutches WBAT L Level of assistance: Modified independence Comments: Pt with better balance with WBAT L and able to execute without difficulty.  Pt with decreased gait speed and wt bear to right                                                                                                                                 TREATMENT DATE: EVAL  and HEP    PATIENT EDUCATION:  Education details: POC, Explanation of findings, PRICE care of post surgical incision/ post op care, WBAT L and issue HEP Person educated: Patient and Spouse Education method: Explanation, Demonstration, Tactile cues, Verbal cues, and Handouts Education comprehension: verbalized understanding, returned demonstration, verbal cues required, tactile cues required, and needs further education  HOME EXERCISE PROGRAM: Access Code: [email or web address] URL: [email or web address] Date: 11/08/2023 Prepared by: Graydon Dingwall  Exercises - Supine Ankle Dorsiflexion and Plantarflexion AROM  - 1 x daily - 7 x weekly - 3 sets - 10 reps - Supine Ankle Inversion and Eversion AROM  - 1 x daily - 7 x weekly - 3 sets - 10 reps - Supine Ankle Circles  - 1 x daily - 7 x weekly - 3 sets - 10 reps - Standing Weight Shift Side to Side  - 1 x daily - 7 x weekly - 3 sets - 10 reps - Standing Weight Shifting Forward and Backward  - 1 x daily - 7 x weekly - 3 sets -  10 reps - Staggered Stance Step through [employer] on Crutches  - 1 x daily - 7 x weekly - 3 sets -  10 reps  ASSESSMENT:  CLINICAL IMPRESSION: Patient is a 48 y.o. female who was seen today for physical therapy evaluation and treatment for Left ankle arthroscopy/syndesmosis repair now WBAT L in CAM boot and now beginning WBAT L today. Pt reports falling with crutches with lack of balance due to NWB status. Pt seems to be more steady post session with new WB status. Pt LEFS score shows decrease in functional ability below PLOF. Pt would benefit from skilled PT to address impairments, decrease pain and return to WNL ankle ROM and improving gait and balance to return to walking dog and safe mobility for home and work  OBJECTIVE IMPAIRMENTS: decreased activity tolerance, decreased balance, decreased knowledge of condition, decreased knowledge of use of DME, decreased mobility, difficulty walking, decreased ROM, decreased strength, and pain.   ACTIVITY LIMITATIONS: carrying, lifting, bending, standing, squatting, stairs, transfers, bed mobility, and locomotion level  PARTICIPATION LIMITATIONS: meal prep, cleaning, laundry, driving, shopping, community activity, and occupation  PERSONAL FACTORS: Stomach tumor removed benign, PVC's, osteopenia, bariatric surgery are also affecting patient's functional outcome.   REHAB POTENTIAL: Excellent  CLINICAL DECISION MAKING: Stable/uncomplicated  EVALUATION COMPLEXITY: Low   GOALS: Goals reviewed with patient? Yes  SHORT TERM GOALS: Target date: 12-06-23 Pt will be compliant and knowledgeable with initial HEP for improved comfort and carryover and care post op Baseline:limited knowledge Goal status: INITIAL  2.  Pt will self report left ankle pain not greater than 4/10 for improved comfort and functional mobility in CAM boot with WBAT L Baseline: WBAT L  5/10 Goal status: INITIAL  3.  Pt will be able to wean to a normal tennis show with minimal pain Baseline: In CAM walking boot Goal status: INITIAL    LONG TERM GOALS: Target date:  01-26-24  I with advanced HEP Baseline: limited knowledge Goal status: INITIAL  2.  Pt will walk dog for 1 mile with LRAD with minimal pain 2/10 or less Baseline: on crutches and WBAT L Goal status: INITIAL  3.   Pt will improve L ankle DF ROM to no less than 10 degrees for improved ankle mobility and gait  Baseline: Left DF 1 degree Goal status: INITIAL  4.  Pt LEFS will improve to at least 36/80 to show improved functional change over 8 weeks Baseline: 18/80 Goal status: INITIAL  5.  Pt will be able to perform standing SL heel raise on LT 15/25 x to show improved tolerance and strength  Baseline: 0/25  Goal Status INITIAL  6. Pt will be able to perform household chores with minimal pain for 30 minutes  Baseline: unable to stand for more than 5 minutes  Goal Status INITIAL   PLAN:  PT FREQUENCY: 1-2x/week  PT DURATION: 8 weeks  PLANNED INTERVENTIONS: 97164- PT Re-evaluation, 97750- Physical Performance Testing, 97110-Therapeutic exercises, 97530- Therapeutic activity, [id number]- Neuromuscular re-education, 97535- Self Care, 02859- Manual therapy, [id number]- Gait training, [id number]- Aquatic Therapy, [phone]- Electrical stimulation (manual), Patient/Family education, Balance training, Stair training, Taping, Joint mobilization, Spinal mobilization, DME instructions, Cryotherapy, and Moist heat  PLAN FOR NEXT SESSION: Gait training, progress HEP as pt tolerates. SHERRYLL LITTIE Helene FORBES Younique Casad PT 11/18/23 8:38 AM Phone: [phone] Fax: [phone]

## 2023-11-22 ENCOUNTER — Ambulatory Visit: Admitting: Physical Therapy

## 2023-11-22 ENCOUNTER — Encounter: Payer: Self-pay | Admitting: Physical Therapy

## 2023-11-22 DIAGNOSIS — M25572 Pain in left ankle and joints of left foot: Secondary | ICD-10-CM | POA: Diagnosis not present

## 2023-11-22 DIAGNOSIS — R2689 Other abnormalities of gait and mobility: Secondary | ICD-10-CM

## 2023-11-22 DIAGNOSIS — M6281 Muscle weakness (generalized): Secondary | ICD-10-CM

## 2023-11-22 NOTE — Therapy (Signed)
 OUTPATIENT PHYSICAL THERAPY DAILY NOTE   Patient Name: Lisa Mosley MRN: [id number] DOB:1975-10-13, 48 y.o., female Today's Date: 11/22/2023  END OF SESSION:  PT End of Session - 11/22/23 0927     Visit Number 4    Number of Visits 16    Date for PT Re-Evaluation 01/03/24    Authorization Type [employer]    PT Start Time 0930    PT Stop Time 1008    PT Time Calculation (min) 38 min          Past Medical History:  Diagnosis Date   HTN (hypertension)    Migraines    Nephrolithiasis    PVC (premature ventricular contraction)    Stomach tumor (benign)    Past Surgical History:  Procedure Laterality Date   ABDOMINAL HYSTERECTOMY     APPENDECTOMY     CHOLECYSTECTOMY     HERNIA REPAIR     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)     TUBAL LIGATION     Now reversed   Patient Active Problem List   Diagnosis Date Noted   PVC's (premature ventricular contractions) 07/13/2017   Fatigue 07/13/2017   Hypertension 07/13/2017   Severe dysplasia of cervix 04/17/2015    PCP: Leonel Cole, MD    PCP: Leonel Cole, MD   REFERRING PROVIDER: Silva Juliene SAUNDERS, DPM   REFERRING DIAG:  [phone] (ICD-10-CM) - Ankle instability, left  M25.872 (ICD-10-CM) - Impingement syndrome of left ankle  S93.432D (ICD-10-CM) - Ankle syndesmosis disruption, left, subsequent encounter    THERAPY DIAG:  Pain in left ankle and joints of left foot  Other abnormalities of gait and mobility  Muscle weakness (generalized)  Rationale for Evaluation and Treatment: Rehabilitation  ONSET DATE: surgery 10-14-23  SUBJECTIVE:   SUBJECTIVE STATEMENT:  11/22/2023: Pt reports some soreness the day following therapy but her pain is now back to baseline.  EVAL: Pt accompanied by spouse to evaluation.Pt has been seen in clinic formerly for L distal fibular fx with avulsion of medial malleolus after fall 02-01-23. I had surgery on 10-14-23 and I was here in clinic in  January 2025. I broke my ankle in October 2024 and was never painfree. I had surgery and had ligament repaired and now I am in PT. I would like to be able to walk my dog, boxer pit bull mix and eventually running.  I would also like to return to strength training. I was sometimes at [employer] and [facility] at [place]. 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 I am non wt bearing and I am not doing household chores  PERTINENT HISTORY: Stomach tumor removed benign, PVC's, osteopenia, bariatric surgery PAIN:  Are you having pain? Yes: NPRS scale: at rest 2/10  and at worst 5/10 Pain location: Left and Right lateral ankle where incisions are. Pain description: achy Aggravating factors: trying to stretch too much in Dorsiflexion, not standing for longer than 5 minutes Relieving factors: ice and elevate leg and use PRICE Aggravating factors- cat lies on it initially   PRECAUTIONS: Other: WBAT L in CAM walking boot  may be full weightbearing as tolerated with range of motion   RED FLAGS: None   WEIGHT BEARING RESTRICTIONS: Yes WBAT L  FALLS:  Has patient fallen in last 6 months? Yes. Number of falls 5 x  while using crutches NWB L  LIVING ENVIRONMENT: Lives with: lives with their family Lives in: House/apartment Stairs: 4 steps to porch and inside but bedroom on main floor Has following  equipment at home: Single point cane, Crutches, Wheelchair (manual), and Tour manager  OCCUPATION: High school Teacher English  PLOF: Independent  PATIENT GOALS: decrease pain in left foot and be able to return to walking dog  NEXT MD VISIT: TBD  OBJECTIVE:  Note: Objective measures were completed at Evaluation unless otherwise noted.  DIAGNOSTIC FINDINGS: See medical record  PATIENT SURVEYS:  LEFS  Extreme difficulty/unable (0), Quite a bit of difficulty (1), Moderate difficulty (2), Little difficulty (3), No difficulty (4) Survey date:    11-08-23  Any of your usual work, housework or school  activities     0  2. Usual hobbies, recreational or sporting activities      0  3. Getting into/out of the bath      1  4. Walking between rooms       0  5. Putting on socks/shoes       4  6. Squatting       0  7. Lifting an object, like a bag of groceries from the floor       1  8. Performing light activities around your home       1  9. Performing heavy activities around your home       1  10. Getting into/out of a car       2  11. Walking 2 blocks       0  12. Walking 1 mile       0  13. Going up/down 10 stairs (1 flight)       0  14. Standing for 1 hour       0  15.  sitting for 1 hour        4  16. Running on even ground       0  17. Running on uneven ground        0  18. Making sharp turns while running fast        0  19. Hopping         0  20. Rolling over in bed       4  Score total:  18/80  (22.5 %)     COGNITION: Overall cognitive status: Within functional limits for tasks assessed     SENSATION: WFL  EDEMA:  Figure 8: Right 49 cm  L 51 cm    POSTURE: rounded shoulders, forward head, and weight shift right  PALPATION: TTP to bil ankle malleous and along L fibularis longus and brevis  LOWER EXTREMITY ROM:  Active ROM Right eval Left eval Left  11/22/23  Hip flexion     Hip extension     Hip abduction     Hip adduction     Hip internal rotation     Hip external rotation     Knee flexion 130 132   Knee extension     Ankle dorsiflexion 15 1 7   Ankle plantarflexion 55 49   Ankle inversion 40 29   Ankle eversion 35 22    (Blank rows = not tested)  LOWER EXTREMITY MMT:  Left side not tested due to post surgery  MMT Right eval Left eval  Hip flexion 5   Hip extension    Hip abduction    Hip adduction    Hip internal rotation    Hip external rotation    Knee flexion 5   Knee extension 5   Ankle dorsiflexion 5 3-  Ankle plantarflexion 4 3-  Ankle inversion 5 3-  Ankle eversion 5 3-   (Blank rows = not tested)  LOWER EXTREMITY SPECIAL TESTS:   NT due to post surgery FUNCTIONAL TESTS:  5 times sit to stand: 9.73 sec 2 minute walk test: TBD     SL stance Left 0 sec  Right  30 sec GAIT: Distance walked: 300 ft Assistive device utilized: Crutches WBAT L Level of assistance: Modified independence Comments: Pt with better balance with WBAT L and able to execute without difficulty.  Pt with decreased gait speed and wt bear to right                                                                                                                                 TREATMENT DATE:   [facility] Adult PT Treatment  11/22/2023:  Therapeutic Exercise:  Bike - L2 - 5 min Towel scrunch - 1' x2 Ankle inv/eversion with YTB x 10 each  Ankle DF with YTB  Ankle PF Red band x 10  Seated heel raise, seated toe raises  Seated achilles stretch - 1' x2 Supine SLR left x 15 , hip abduction x 15 , prone hip extension x 15  Bridge x 15  Seated calf stretch with towel    Neuromuscular re-ed: BAPS board L4 - 20x ea direction, circles each direction      HOME EXERCISE PROGRAM: Access Code: [email or web address] URL: [email or web address] Date: 11/08/2023 Prepared by: Graydon Dingwall  Exercises - Supine Ankle Dorsiflexion and Plantarflexion AROM  - 1 x daily - 7 x weekly - 3 sets - 10 reps - Supine Ankle Inversion and Eversion AROM  - 1 x daily - 7 x weekly - 3 sets - 10 reps - Supine Ankle Circles  - 1 x daily - 7 x weekly - 3 sets - 10 reps - Standing Weight Shift Side to Side  - 1 x daily - 7 x weekly - 3 sets - 10 reps - Standing Weight Shifting Forward and Backward  - 1 x daily - 7 x weekly - 3 sets - 10 reps - Staggered Stance Step through [employer] on Crutches  - 1 x daily - 7 x weekly - 3 sets - 10 reps - Seated Ankle Inversion with Resistance and Legs Crossed  - 1 x daily - 7 x weekly - 2 sets - 10 reps - Seated Ankle Dorsiflexion with Resistance  - 1 x daily - 7 x weekly - 2 sets - 10 reps - Seated Ankle Eversion with Resistance  - 1 x  daily - 7 x weekly - 2 sets - 10 reps - Seated Ankle Plantarflexion with Resistance  - 1 x daily - 7 x weekly - 2 sets - 10 reps  ASSESSMENT:  CLINICAL IMPRESSION:  11/22/2023:  Nathanel tolerated session well with no adverse reaction. Reports soreness and achiness. Does not wear the boot or brace at home.  Sees MD Thursday. Continued to concentrate  on ankle mobility to good effect.  Df AROM has improved. Updated HEP.     EVAL: Patient is a 48 y.o. female who was seen today for physical therapy evaluation and treatment for Left ankle arthroscopy/syndesmosis repair now WBAT L in CAM boot and now beginning WBAT L today. Pt reports falling with crutches with lack of balance due to NWB status. Pt seems to be more steady post session with new WB status. Pt LEFS score shows decrease in functional ability below PLOF. Pt would benefit from skilled PT to address impairments, decrease pain and return to WNL ankle ROM and improving gait and balance to return to walking dog and safe mobility for home and work  OBJECTIVE IMPAIRMENTS: decreased activity tolerance, decreased balance, decreased knowledge of condition, decreased knowledge of use of DME, decreased mobility, difficulty walking, decreased ROM, decreased strength, and pain.   ACTIVITY LIMITATIONS: carrying, lifting, bending, standing, squatting, stairs, transfers, bed mobility, and locomotion level  PARTICIPATION LIMITATIONS: meal prep, cleaning, laundry, driving, shopping, community activity, and occupation  PERSONAL FACTORS: Stomach tumor removed benign, PVC's, osteopenia, bariatric surgery are also affecting patient's functional outcome.   REHAB POTENTIAL: Excellent  CLINICAL DECISION MAKING: Stable/uncomplicated  EVALUATION COMPLEXITY: Low   GOALS: Goals reviewed with patient? Yes  SHORT TERM GOALS: Target date: 12-06-23 Pt will be compliant and knowledgeable with initial HEP for improved comfort and carryover and care post  op Baseline:limited knowledge Goal status: INITIAL  2.  Pt will self report left ankle pain not greater than 4/10 for improved comfort and functional mobility in CAM boot with WBAT L Baseline: WBAT L  5/10 Goal status: INITIAL  3.  Pt will be able to wean to a normal tennis show with minimal pain Baseline: In CAM walking boot Goal status: INITIAL    LONG TERM GOALS: Target date: 01-26-24  I with advanced HEP Baseline: limited knowledge Goal status: INITIAL  2.  Pt will walk dog for 1 mile with LRAD with minimal pain 2/10 or less Baseline: on crutches and WBAT L Goal status: INITIAL  3.   Pt will improve L ankle DF ROM to no less than 10 degrees for improved ankle mobility and gait  Baseline: Left DF 1 degree Goal status: INITIAL  4.  Pt LEFS will improve to at least 36/80 to show improved functional change over 8 weeks Baseline: 18/80 Goal status: INITIAL  5.  Pt will be able to perform standing SL heel raise on LT 15/25 x to show improved tolerance and strength  Baseline: 0/25  Goal Status INITIAL  6. Pt will be able to perform household chores with minimal pain for 30 minutes  Baseline: unable to stand for more than 5 minutes  Goal Status INITIAL   PLAN:  PT FREQUENCY: 1-2x/week  PT DURATION: 8 weeks  PLANNED INTERVENTIONS: [id number]- PT Re-evaluation, 97750- Physical Performance Testing, 97110-Therapeutic exercises, 97530- Therapeutic activity, [id number]- Neuromuscular re-education, 97535- Self Care, 02859- Manual therapy, [id number]- Gait training, [phone]- Aquatic Therapy, [phone]- Electrical stimulation (manual), Patient/Family education, Balance training, Stair training, Taping, Joint mobilization, Spinal mobilization, DME instructions, Cryotherapy, and Moist heat  PLAN FOR NEXT SESSION: Gait training, progress HEP as pt tolerates. WBAT L   Graydon Dingwall, PT, ATRIC Certified Exercise Expert for the Aging Adult  11/08/23 2:51 PM Phone: [phone] Fax: [phone]    REFERRING DIAG:  M25.372 (ICD-10-CM) - Ankle instability, left  M25.872 (ICD-10-CM) - Impingement syndrome of left ankle  S93.432D (ICD-10-CM) - Ankle syndesmosis disruption, left, subsequent encounter  THERAPY DIAG:  Pain in left ankle and joints of left foot  Other abnormalities of gait and mobility  Muscle weakness (generalized)  Rationale for Evaluation and Treatment: Rehabilitation  ONSET DATE: surgery 10-14-23  SUBJECTIVE:   SUBJECTIVE STATEMENT: Pt accompanied by spouse to evaluation.Pt has been seen in clinic formerly for L distal fibular fx with avulsion of medial malleolus after fall 02-01-23. I had surgery on 10-14-23 and I was here in clinic in January 2025. I broke my ankle in October 2024 and was never painfree. I had surgery and had ligament repaired and now I am in PT. I would like to be able to walk my dog, boxer pit bull mix and eventually running.  I would also like to return to strength training. I was sometimes at [employer] and [facility] at [place]. 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 I am non wt bearing and I am not doing household chores  PERTINENT HISTORY: Stomach tumor removed benign, PVC's, osteopenia, bariatric surgery PAIN:  Are you having pain? Yes: NPRS scale: at rest 2/10  and at worst 5/10 Pain location: Left and Right lateral ankle where incisions are. Pain description: achy Aggravating factors: trying to stretch too much in Dorsiflexion, not standing for longer than 5 minutes Relieving factors: ice and elevate leg and use PRICE Aggravating factors- cat lies on it initially   PRECAUTIONS: Other: WBAT L in CAM walking boot  RED FLAGS: None   WEIGHT BEARING RESTRICTIONS: Yes WBAT L  FALLS:  Has patient fallen in last 6 months? Yes. Number of falls 5 x  while using crutches NWB L  LIVING ENVIRONMENT: Lives with: lives with their family Lives in: House/apartment Stairs: 4 steps to porch and inside but bedroom on main floor Has following  equipment at home: Single point cane, Crutches, Wheelchair (manual), and Tour manager  OCCUPATION: High school Teacher English  PLOF: Independent  PATIENT GOALS: decrease pain in left foot and be able to return to walking dog  NEXT MD VISIT: TBD  OBJECTIVE:  Note: Objective measures were completed at Evaluation unless otherwise noted.  DIAGNOSTIC FINDINGS: See medical record  PATIENT SURVEYS:  LEFS  Extreme difficulty/unable (0), Quite a bit of difficulty (1), Moderate difficulty (2), Little difficulty (3), No difficulty (4) Survey date:    11-08-23  Any of your usual work, housework or school activities     0  2. Usual hobbies, recreational or sporting activities      0  3. Getting into/out of the bath      1  4. Walking between rooms       0  5. Putting on socks/shoes       4  6. Squatting       0  7. Lifting an object, like a bag of groceries from the floor       1  8. Performing light activities around your home       1  9. Performing heavy activities around your home       1  10. Getting into/out of a car       2  11. Walking 2 blocks       0  12. Walking 1 mile       0  13. Going up/down 10 stairs (1 flight)       0  14. Standing for 1 hour       0  15.  sitting for 1 hour        4  16. Running on even ground       0  17. Running on uneven ground        0  18. Making sharp turns while running fast        0  19. Hopping         0  20. Rolling over in bed       4  Score total:  18/80  (22.5 %)     COGNITION: Overall cognitive status: Within functional limits for tasks assessed     SENSATION: WFL  EDEMA:  Figure 8: Right 49 cm  L 51 cm    POSTURE: rounded shoulders, forward head, and weight shift right  PALPATION: TTP to bil ankle malleous and along L fibularis longus and brevis  LOWER EXTREMITY ROM:  Active ROM Right eval Left eval  Hip flexion    Hip extension    Hip abduction    Hip adduction    Hip internal rotation    Hip external rotation     Knee flexion 130 132  Knee extension    Ankle dorsiflexion 15 1  Ankle plantarflexion 55 49  Ankle inversion 40 29  Ankle eversion 35 22   (Blank rows = not tested)  LOWER EXTREMITY MMT:  Left side not tested due to post surgery  MMT Right eval Left eval  Hip flexion 5   Hip extension    Hip abduction    Hip adduction    Hip internal rotation    Hip external rotation    Knee flexion 5   Knee extension 5   Ankle dorsiflexion 5 3-  Ankle plantarflexion 4 3-  Ankle inversion 5 3-  Ankle eversion 5 3-   (Blank rows = not tested)  LOWER EXTREMITY SPECIAL TESTS:  NT due to post surgery FUNCTIONAL TESTS:  5 times sit to stand: 9.73 sec 2 minute walk test: TBD     SL stance Left 0 sec  Right  30 sec GAIT: Distance walked: 300 ft Assistive device utilized: Crutches WBAT L Level of assistance: Modified independence Comments: Pt with better balance with WBAT L and able to execute without difficulty.  Pt with decreased gait speed and wt bear to right                                                                                                                                 TREATMENT DATE: EVAL  and HEP    PATIENT EDUCATION:  Education details: POC, Explanation of findings, PRICE care of post surgical incision/ post op care, WBAT L and issue HEP Person educated: Patient and Spouse Education method: Explanation, Demonstration, Tactile cues, Verbal cues, and Handouts Education comprehension: verbalized understanding, returned demonstration, verbal cues required, tactile cues required, and needs further education  HOME EXERCISE PROGRAM: Access Code: [email or web address] URL: [email or web address] Date: 11/08/2023 Prepared by: Graydon Dingwall  Exercises - Supine Ankle Dorsiflexion and Plantarflexion AROM  -  1 x daily - 7 x weekly - 3 sets - 10 reps - Supine Ankle Inversion and Eversion AROM  - 1 x daily - 7 x weekly - 3 sets - 10 reps - Supine Ankle Circles  - 1 x  daily - 7 x weekly - 3 sets - 10 reps - Standing Weight Shift Side to Side  - 1 x daily - 7 x weekly - 3 sets - 10 reps - Standing Weight Shifting Forward and Backward  - 1 x daily - 7 x weekly - 3 sets - 10 reps - Staggered Stance Step through [employer] on Crutches  - 1 x daily - 7 x weekly - 3 sets - 10 reps  ASSESSMENT:  CLINICAL IMPRESSION: Patient is a 48 y.o. female who was seen today for physical therapy evaluation and treatment for Left ankle arthroscopy/syndesmosis repair now WBAT L in CAM boot and now beginning WBAT L today. Pt reports falling with crutches with lack of balance due to NWB status. Pt seems to be more steady post session with new WB status. Pt LEFS score shows decrease in functional ability below PLOF. Pt would benefit from skilled PT to address impairments, decrease pain and return to WNL ankle ROM and improving gait and balance to return to walking dog and safe mobility for home and work  OBJECTIVE IMPAIRMENTS: decreased activity tolerance, decreased balance, decreased knowledge of condition, decreased knowledge of use of DME, decreased mobility, difficulty walking, decreased ROM, decreased strength, and pain.   ACTIVITY LIMITATIONS: carrying, lifting, bending, standing, squatting, stairs, transfers, bed mobility, and locomotion level  PARTICIPATION LIMITATIONS: meal prep, cleaning, laundry, driving, shopping, community activity, and occupation  PERSONAL FACTORS: Stomach tumor removed benign, PVC's, osteopenia, bariatric surgery are also affecting patient's functional outcome.   REHAB POTENTIAL: Excellent  CLINICAL DECISION MAKING: Stable/uncomplicated  EVALUATION COMPLEXITY: Low   GOALS: Goals reviewed with patient? Yes  SHORT TERM GOALS: Target date: 12-06-23 Pt will be compliant and knowledgeable with initial HEP for improved comfort and carryover and care post op Baseline:limited knowledge Goal status: INITIAL  2.  Pt will self report left ankle pain not  greater than 4/10 for improved comfort and functional mobility in CAM boot with WBAT L Baseline: WBAT L  5/10 Goal status: INITIAL  3.  Pt will be able to wean to a normal tennis show with minimal pain Baseline: In CAM walking boot Goal status: INITIAL    LONG TERM GOALS: Target date: 01-26-24  I with advanced HEP Baseline: limited knowledge Goal status: INITIAL  2.  Pt will walk dog for 1 mile with LRAD with minimal pain 2/10 or less Baseline: on crutches and WBAT L Goal status: INITIAL  3.   Pt will improve L ankle DF ROM to no less than 10 degrees for improved ankle mobility and gait  Baseline: Left DF 1 degree Goal status: INITIAL  4.  Pt LEFS will improve to at least 36/80 to show improved functional change over 8 weeks Baseline: 18/80 Goal status: INITIAL  5.  Pt will be able to perform standing SL heel raise on LT 15/25 x to show improved tolerance and strength  Baseline: 0/25  Goal Status INITIAL  6. Pt will be able to perform household chores with minimal pain for 30 minutes  Baseline: unable to stand for more than 5 minutes  Goal Status INITIAL   PLAN:  PT FREQUENCY: 1-2x/week  PT DURATION: 8 weeks  PLANNED INTERVENTIONS: 02835- PT Re-evaluation, 97750- Physical Performance Testing,  97110-Therapeutic exercises, 97530- Therapeutic activity, [id number]- Neuromuscular re-education, [phone]- Self Care, 02859- Manual therapy, [phone]- Gait training, [phone]- Aquatic Therapy, [phone]- Electrical stimulation (manual), Patient/Family education, Balance training, Stair training, Taping, Joint mobilization, Spinal mobilization, DME instructions, Cryotherapy, and Moist heat  PLAN FOR NEXT SESSION: Gait training, progress HEP as pt tolerates. SHERRYLL LITTIE Harlene CHRISTELLA Delquan Poucher PTA 11/22/23 10:05 AM Phone: [phone] Fax: [phone]

## 2023-11-24 ENCOUNTER — Encounter: Payer: Self-pay | Admitting: Physical Therapy

## 2023-11-24 ENCOUNTER — Ambulatory Visit (INDEPENDENT_AMBULATORY_CARE_PROVIDER_SITE_OTHER)

## 2023-11-24 ENCOUNTER — Ambulatory Visit (INDEPENDENT_AMBULATORY_CARE_PROVIDER_SITE_OTHER): Admitting: Podiatry

## 2023-11-24 ENCOUNTER — Ambulatory Visit: Admitting: Physical Therapy

## 2023-11-24 VITALS — Ht 66.0 in | Wt 153.0 lb

## 2023-11-24 DIAGNOSIS — M25572 Pain in left ankle and joints of left foot: Secondary | ICD-10-CM

## 2023-11-24 DIAGNOSIS — M6281 Muscle weakness (generalized): Secondary | ICD-10-CM

## 2023-11-24 DIAGNOSIS — S82892D Other fracture of left lower leg, subsequent encounter for closed fracture with routine healing: Secondary | ICD-10-CM

## 2023-11-24 DIAGNOSIS — R2689 Other abnormalities of gait and mobility: Secondary | ICD-10-CM

## 2023-11-24 DIAGNOSIS — S93432D Sprain of tibiofibular ligament of left ankle, subsequent encounter: Secondary | ICD-10-CM

## 2023-11-24 NOTE — Therapy (Signed)
 OUTPATIENT PHYSICAL THERAPY DAILY NOTE   Patient Name: Lisa Mosley MRN: [id number] DOB:[birth year], 48 y.o., female Today's Date: 11/24/2023  END OF SESSION:  PT End of Session - 11/24/23 0930     Visit Number 5    Number of Visits 16    Date for PT Re-Evaluation 01/03/24    Authorization Type [employer]    PT Start Time 0930    PT Stop Time 1012    PT Time Calculation (min) 42 min          Past Medical History:  Diagnosis Date   HTN (hypertension)    Migraines    Nephrolithiasis    PVC (premature ventricular contraction)    Stomach tumor (benign)    Past Surgical History:  Procedure Laterality Date   ABDOMINAL HYSTERECTOMY     APPENDECTOMY     CHOLECYSTECTOMY     HERNIA REPAIR     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)     TUBAL LIGATION     Now reversed   Patient Active Problem List   Diagnosis Date Noted   PVC's (premature ventricular contractions) 07/13/2017   Fatigue 07/13/2017   Hypertension 07/13/2017   Severe dysplasia of cervix 04/17/2015    PCP: Leonel Cole, MD    PCP: Leonel Cole, MD   REFERRING PROVIDER: Silva Juliene SAUNDERS, DPM   REFERRING DIAG:  [phone] (ICD-10-CM) - Ankle instability, left  M25.872 (ICD-10-CM) - Impingement syndrome of left ankle  S93.432D (ICD-10-CM) - Ankle syndesmosis disruption, left, subsequent encounter    THERAPY DIAG:  Pain in left ankle and joints of left foot  Other abnormalities of gait and mobility  Muscle weakness (generalized)  Rationale for Evaluation and Treatment: Rehabilitation  ONSET DATE: surgery 10-14-23  SUBJECTIVE:   SUBJECTIVE STATEMENT:  11/24/2023: Pt reports some continued soreness with DF but this is improving.   EVAL: Pt accompanied by spouse to evaluation.Pt has been seen in clinic formerly for L distal fibular fx with avulsion of medial malleolus after fall 02-01-23. I had surgery on 10-14-23 and I was here in clinic in January 2025. I broke my  ankle in October 2024 and was never painfree. I had surgery and had ligament repaired and now I am in PT. I would like to be able to walk my dog, boxer pit bull mix and eventually running.  I would also like to return to strength training. I was sometimes at [employer] and [facility] at [place]. 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 I am non wt bearing and I am not doing household chores  PERTINENT HISTORY: Stomach tumor removed benign, PVC's, osteopenia, bariatric surgery PAIN:  Are you having pain? Yes: NPRS scale: at rest 2/10  and at worst 5/10 Pain location: Left and Right lateral ankle where incisions are. Pain description: achy Aggravating factors: trying to stretch too much in Dorsiflexion, not standing for longer than 5 minutes Relieving factors: ice and elevate leg and use PRICE Aggravating factors- cat lies on it initially   PRECAUTIONS: Other: WBAT L in CAM walking boot  may be full weightbearing as tolerated with range of motion   RED FLAGS: None   WEIGHT BEARING RESTRICTIONS: Yes WBAT L  FALLS:  Has patient fallen in last 6 months? Yes. Number of falls 5 x  while using crutches NWB L  LIVING ENVIRONMENT: Lives with: lives with their family Lives in: House/apartment Stairs: 4 steps to porch and inside but bedroom on main floor Has following equipment at home: Single  point cane, Crutches, Wheelchair (manual), and Tour manager  OCCUPATION: High school Teacher English  PLOF: Independent  PATIENT GOALS: decrease pain in left foot and be able to return to walking dog  NEXT MD VISIT: TBD  OBJECTIVE:  Note: Objective measures were completed at Evaluation unless otherwise noted.  DIAGNOSTIC FINDINGS: See medical record  PATIENT SURVEYS:  LEFS  Extreme difficulty/unable (0), Quite a bit of difficulty (1), Moderate difficulty (2), Little difficulty (3), No difficulty (4) Survey date:    11-08-23  Any of your usual work, housework or school activities     0  2. Usual  hobbies, recreational or sporting activities      0  3. Getting into/out of the bath      1  4. Walking between rooms       0  5. Putting on socks/shoes       4  6. Squatting       0  7. Lifting an object, like a bag of groceries from the floor       1  8. Performing light activities around your home       1  9. Performing heavy activities around your home       1  10. Getting into/out of a car       2  11. Walking 2 blocks       0  12. Walking 1 mile       0  13. Going up/down 10 stairs (1 flight)       0  14. Standing for 1 hour       0  15.  sitting for 1 hour        4  16. Running on even ground       0  17. Running on uneven ground        0  18. Making sharp turns while running fast        0  19. Hopping         0  20. Rolling over in bed       4  Score total:  18/80  (22.5 %)     COGNITION: Overall cognitive status: Within functional limits for tasks assessed     SENSATION: WFL  EDEMA:  Figure 8: Right 49 cm  L 51 cm    POSTURE: rounded shoulders, forward head, and weight shift right  PALPATION: TTP to bil ankle malleous and along L fibularis longus and brevis  LOWER EXTREMITY ROM:  Active ROM Right eval Left eval Left  11/22/23  Hip flexion     Hip extension     Hip abduction     Hip adduction     Hip internal rotation     Hip external rotation     Knee flexion 130 132   Knee extension     Ankle dorsiflexion 15 1 7   Ankle plantarflexion 55 49   Ankle inversion 40 29   Ankle eversion 35 22    (Blank rows = not tested)  LOWER EXTREMITY MMT:  Left side not tested due to post surgery  MMT Right eval Left eval  Hip flexion 5   Hip extension    Hip abduction    Hip adduction    Hip internal rotation    Hip external rotation    Knee flexion 5   Knee extension 5   Ankle dorsiflexion 5 3-  Ankle plantarflexion 4 3-  Ankle inversion 5 3-  Ankle eversion 5 3-   (Blank rows = not tested)  LOWER EXTREMITY SPECIAL TESTS:  NT due to post  surgery FUNCTIONAL TESTS:  5 times sit to stand: 9.73 sec 2 minute walk test: TBD     SL stance Left 0 sec  Right  30 sec GAIT: Distance walked: 300 ft Assistive device utilized: Crutches WBAT L Level of assistance: Modified independence Comments: Pt with better balance with WBAT L and able to execute without difficulty.  Pt with decreased gait speed and wt bear to right                                                                                                                                 TREATMENT DATE:   [facility] Adult PT Treatment  11/24/2023:  Therapeutic Exercise:  Bike - L2 - 5 min BAPS board - all directions Ankle DF/inv/ev with RTB  Ankle PF blue TB 3 x 15  Seated achilles stretch - 1' x2  Neuromuscular re-ed: BAPS board L4 - 20x ea direction, circles each direction - resisted inv/ev - 1 weights Tandem on airex Lateral walking on airex      HOME EXERCISE PROGRAM: Access Code: [email or web address] URL: [email or web address] Date: 11/08/2023 Prepared by: Graydon Dingwall  Exercises - Supine Ankle Dorsiflexion and Plantarflexion AROM  - 1 x daily - 7 x weekly - 3 sets - 10 reps - Supine Ankle Inversion and Eversion AROM  - 1 x daily - 7 x weekly - 3 sets - 10 reps - Supine Ankle Circles  - 1 x daily - 7 x weekly - 3 sets - 10 reps - Standing Weight Shift Side to Side  - 1 x daily - 7 x weekly - 3 sets - 10 reps - Standing Weight Shifting Forward and Backward  - 1 x daily - 7 x weekly - 3 sets - 10 reps - Staggered Stance Step through [employer] on Crutches  - 1 x daily - 7 x weekly - 3 sets - 10 reps - Seated Ankle Inversion with Resistance and Legs Crossed  - 1 x daily - 7 x weekly - 2 sets - 10 reps - Seated Ankle Dorsiflexion with Resistance  - 1 x daily - 7 x weekly - 2 sets - 10 reps - Seated Ankle Eversion with Resistance  - 1 x daily - 7 x weekly - 2 sets - 10 reps - Seated Ankle Plantarflexion with Resistance  - 1 x daily - 7 x weekly - 2 sets - 10  reps  ASSESSMENT:  CLINICAL IMPRESSION:  11/24/2023:  Nathanel tolerated session well with no adverse reaction. Pt reports no increase in pain with listed exercises with exception of airex walking with heel hang which was d/c'd.  Pt will likely be moved to ASO at follow up with podiatry and we will advance standing strengthening at that time.    EVAL: Patient is a 48 y.o. female who was  seen today for physical therapy evaluation and treatment for Left ankle arthroscopy/syndesmosis repair now WBAT L in CAM boot and now beginning WBAT L today. Pt reports falling with crutches with lack of balance due to NWB status. Pt seems to be more steady post session with new WB status. Pt LEFS score shows decrease in functional ability below PLOF. Pt would benefit from skilled PT to address impairments, decrease pain and return to WNL ankle ROM and improving gait and balance to return to walking dog and safe mobility for home and work  OBJECTIVE IMPAIRMENTS: decreased activity tolerance, decreased balance, decreased knowledge of condition, decreased knowledge of use of DME, decreased mobility, difficulty walking, decreased ROM, decreased strength, and pain.   ACTIVITY LIMITATIONS: carrying, lifting, bending, standing, squatting, stairs, transfers, bed mobility, and locomotion level  PARTICIPATION LIMITATIONS: meal prep, cleaning, laundry, driving, shopping, community activity, and occupation  PERSONAL FACTORS: Stomach tumor removed benign, PVC's, osteopenia, bariatric surgery are also affecting patient's functional outcome.   REHAB POTENTIAL: Excellent  CLINICAL DECISION MAKING: Stable/uncomplicated  EVALUATION COMPLEXITY: Low   GOALS: Goals reviewed with patient? Yes  SHORT TERM GOALS: Target date: 12-06-23 Pt will be compliant and knowledgeable with initial HEP for improved comfort and carryover and care post op Baseline:limited knowledge Goal status: INITIAL  2.  Pt will self report left ankle pain  not greater than 4/10 for improved comfort and functional mobility in CAM boot with WBAT L Baseline: WBAT L  5/10 Goal status: INITIAL  3.  Pt will be able to wean to a normal tennis show with minimal pain Baseline: In CAM walking boot Goal status: INITIAL    LONG TERM GOALS: Target date: 01-26-24  I with advanced HEP Baseline: limited knowledge Goal status: INITIAL  2.  Pt will walk dog for 1 mile with LRAD with minimal pain 2/10 or less Baseline: on crutches and WBAT L Goal status: INITIAL  3.   Pt will improve L ankle DF ROM to no less than 10 degrees for improved ankle mobility and gait  Baseline: Left DF 1 degree Goal status: INITIAL  4.  Pt LEFS will improve to at least 36/80 to show improved functional change over 8 weeks Baseline: 18/80 Goal status: INITIAL  5.  Pt will be able to perform standing SL heel raise on LT 15/25 x to show improved tolerance and strength  Baseline: 0/25  Goal Status INITIAL  6. Pt will be able to perform household chores with minimal pain for 30 minutes  Baseline: unable to stand for more than 5 minutes  Goal Status INITIAL   PLAN:  PT FREQUENCY: 1-2x/week  PT DURATION: 8 weeks  PLANNED INTERVENTIONS: 97164- PT Re-evaluation, 97750- Physical Performance Testing, 97110-Therapeutic exercises, 97530- Therapeutic activity, [id number]- Neuromuscular re-education, 97535- Self Care, [id number]- Manual therapy, [id number]- Gait training, [phone]- Aquatic Therapy, [phone]- Electrical stimulation (manual), Patient/Family education, Balance training, Stair training, Taping, Joint mobilization, Spinal mobilization, DME instructions, Cryotherapy, and Moist heat  PLAN FOR NEXT SESSION: Gait training, progress HEP as pt tolerates. WBAT LITTIE Graydon Dingwall, PT, ATRIC Certified Exercise Expert for the Aging Adult  11/08/23 2:51 PM Phone: [phone] Fax: [phone]   REFERRING DIAG:  M25.372 (ICD-10-CM) - Ankle instability, left  M25.872 (ICD-10-CM) -  Impingement syndrome of left ankle  S93.432D (ICD-10-CM) - Ankle syndesmosis disruption, left, subsequent encounter    THERAPY DIAG:  Pain in left ankle and joints of left foot  Other abnormalities of gait and mobility  Muscle weakness (generalized)  Rationale  for Evaluation and Treatment: Rehabilitation  ONSET DATE: surgery 10-14-23  SUBJECTIVE:   SUBJECTIVE STATEMENT: Pt accompanied by spouse to evaluation.Pt has been seen in clinic formerly for L distal fibular fx with avulsion of medial malleolus after fall 02-01-23. I had surgery on 10-14-23 and I was here in clinic in January 2025. I broke my ankle in October 2024 and was never painfree. I had surgery and had ligament repaired and now I am in PT. I would like to be able to walk my dog, boxer pit bull mix and eventually running.  I would also like to return to strength training. I was sometimes at [employer] and [facility] at [place]. 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 I am non wt bearing and I am not doing household chores  PERTINENT HISTORY: Stomach tumor removed benign, PVC's, osteopenia, bariatric surgery PAIN:  Are you having pain? Yes: NPRS scale: at rest 2/10  and at worst 5/10 Pain location: Left and Right lateral ankle where incisions are. Pain description: achy Aggravating factors: trying to stretch too much in Dorsiflexion, not standing for longer than 5 minutes Relieving factors: ice and elevate leg and use PRICE Aggravating factors- cat lies on it initially   PRECAUTIONS: Other: WBAT L in CAM walking boot  RED FLAGS: None   WEIGHT BEARING RESTRICTIONS: Yes WBAT L  FALLS:  Has patient fallen in last 6 months? Yes. Number of falls 5 x  while using crutches NWB L  LIVING ENVIRONMENT: Lives with: lives with their family Lives in: House/apartment Stairs: 4 steps to porch and inside but bedroom on main floor Has following equipment at home: Single point cane, Crutches, Wheelchair (manual), and Nurse, mental health  OCCUPATION: High school Teacher English  PLOF: Independent  PATIENT GOALS: decrease pain in left foot and be able to return to walking dog  NEXT MD VISIT: TBD  OBJECTIVE:  Note: Objective measures were completed at Evaluation unless otherwise noted.  DIAGNOSTIC FINDINGS: See medical record  PATIENT SURVEYS:  LEFS  Extreme difficulty/unable (0), Quite a bit of difficulty (1), Moderate difficulty (2), Little difficulty (3), No difficulty (4) Survey date:    11-08-23  Any of your usual work, housework or school activities     0  2. Usual hobbies, recreational or sporting activities      0  3. Getting into/out of the bath      1  4. Walking between rooms       0  5. Putting on socks/shoes       4  6. Squatting       0  7. Lifting an object, like a bag of groceries from the floor       1  8. Performing light activities around your home       1  9. Performing heavy activities around your home       1  10. Getting into/out of a car       2  11. Walking 2 blocks       0  12. Walking 1 mile       0  13. Going up/down 10 stairs (1 flight)       0  14. Standing for 1 hour       0  15.  sitting for 1 hour        4  16. Running on even ground       0  17. Running on uneven ground  0  18. Making sharp turns while running fast        0  19. Hopping         0  20. Rolling over in bed       4  Score total:  18/80  (22.5 %)     COGNITION: Overall cognitive status: Within functional limits for tasks assessed     SENSATION: WFL  EDEMA:  Figure 8: Right 49 cm  L 51 cm    POSTURE: rounded shoulders, forward head, and weight shift right  PALPATION: TTP to bil ankle malleous and along L fibularis longus and brevis  LOWER EXTREMITY ROM:  Active ROM Right eval Left eval  Hip flexion    Hip extension    Hip abduction    Hip adduction    Hip internal rotation    Hip external rotation    Knee flexion 130 132  Knee extension    Ankle dorsiflexion 15 1  Ankle  plantarflexion 55 49  Ankle inversion 40 29  Ankle eversion 35 22   (Blank rows = not tested)  LOWER EXTREMITY MMT:  Left side not tested due to post surgery  MMT Right eval Left eval  Hip flexion 5   Hip extension    Hip abduction    Hip adduction    Hip internal rotation    Hip external rotation    Knee flexion 5   Knee extension 5   Ankle dorsiflexion 5 3-  Ankle plantarflexion 4 3-  Ankle inversion 5 3-  Ankle eversion 5 3-   (Blank rows = not tested)  LOWER EXTREMITY SPECIAL TESTS:  NT due to post surgery FUNCTIONAL TESTS:  5 times sit to stand: 9.73 sec 2 minute walk test: TBD     SL stance Left 0 sec  Right  30 sec GAIT: Distance walked: 300 ft Assistive device utilized: Crutches WBAT L Level of assistance: Modified independence Comments: Pt with better balance with WBAT L and able to execute without difficulty.  Pt with decreased gait speed and wt bear to right                                                                                                                                 TREATMENT DATE: EVAL  and HEP    PATIENT EDUCATION:  Education details: POC, Explanation of findings, PRICE care of post surgical incision/ post op care, WBAT L and issue HEP Person educated: Patient and Spouse Education method: Explanation, Demonstration, Tactile cues, Verbal cues, and Handouts Education comprehension: verbalized understanding, returned demonstration, verbal cues required, tactile cues required, and needs further education  HOME EXERCISE PROGRAM: Access Code: [email or web address] URL: [email or web address] Date: 11/08/2023 Prepared by: Graydon Dingwall  Exercises - Supine Ankle Dorsiflexion and Plantarflexion AROM  - 1 x daily - 7 x weekly - 3 sets - 10 reps - Supine Ankle Inversion and Eversion AROM  - 1 x daily -  7 x weekly - 3 sets - 10 reps - Supine Ankle Circles  - 1 x daily - 7 x weekly - 3 sets - 10 reps - Standing Weight Shift Side to Side  -  1 x daily - 7 x weekly - 3 sets - 10 reps - Standing Weight Shifting Forward and Backward  - 1 x daily - 7 x weekly - 3 sets - 10 reps - Staggered Stance Step through [employer] on Crutches  - 1 x daily - 7 x weekly - 3 sets - 10 reps  ASSESSMENT:  CLINICAL IMPRESSION: Patient is a 48 y.o. female who was seen today for physical therapy evaluation and treatment for Left ankle arthroscopy/syndesmosis repair now WBAT L in CAM boot and now beginning WBAT L today. Pt reports falling with crutches with lack of balance due to NWB status. Pt seems to be more steady post session with new WB status. Pt LEFS score shows decrease in functional ability below PLOF. Pt would benefit from skilled PT to address impairments, decrease pain and return to WNL ankle ROM and improving gait and balance to return to walking dog and safe mobility for home and work  OBJECTIVE IMPAIRMENTS: decreased activity tolerance, decreased balance, decreased knowledge of condition, decreased knowledge of use of DME, decreased mobility, difficulty walking, decreased ROM, decreased strength, and pain.   ACTIVITY LIMITATIONS: carrying, lifting, bending, standing, squatting, stairs, transfers, bed mobility, and locomotion level  PARTICIPATION LIMITATIONS: meal prep, cleaning, laundry, driving, shopping, community activity, and occupation  PERSONAL FACTORS: Stomach tumor removed benign, PVC's, osteopenia, bariatric surgery are also affecting patient's functional outcome.   REHAB POTENTIAL: Excellent  CLINICAL DECISION MAKING: Stable/uncomplicated  EVALUATION COMPLEXITY: Low   GOALS: Goals reviewed with patient? Yes  SHORT TERM GOALS: Target date: 12-06-23 Pt will be compliant and knowledgeable with initial HEP for improved comfort and carryover and care post op Baseline:limited knowledge Goal status: INITIAL  2.  Pt will self report left ankle pain not greater than 4/10 for improved comfort and functional mobility in CAM boot with  WBAT L Baseline: WBAT L  5/10 Goal status: INITIAL  3.  Pt will be able to wean to a normal tennis show with minimal pain Baseline: In CAM walking boot Goal status: INITIAL    LONG TERM GOALS: Target date: 01-26-24  I with advanced HEP Baseline: limited knowledge Goal status: INITIAL  2.  Pt will walk dog for 1 mile with LRAD with minimal pain 2/10 or less Baseline: on crutches and WBAT L Goal status: INITIAL  3.   Pt will improve L ankle DF ROM to no less than 10 degrees for improved ankle mobility and gait  Baseline: Left DF 1 degree Goal status: INITIAL  4.  Pt LEFS will improve to at least 36/80 to show improved functional change over 8 weeks Baseline: 18/80 Goal status: INITIAL  5.  Pt will be able to perform standing SL heel raise on LT 15/25 x to show improved tolerance and strength  Baseline: 0/25  Goal Status INITIAL  6. Pt will be able to perform household chores with minimal pain for 30 minutes  Baseline: unable to stand for more than 5 minutes  Goal Status INITIAL   PLAN:  PT FREQUENCY: 1-2x/week  PT DURATION: 8 weeks  PLANNED INTERVENTIONS: 97164- PT Re-evaluation, 97750- Physical Performance Testing, 97110-Therapeutic exercises, 97530- Therapeutic activity, [id number]- Neuromuscular re-education, 97535- Self Care, 02859- Manual therapy, [id number]- Gait training, [phone]- Aquatic Therapy, [phone]- Electrical stimulation (manual), Patient/Family education,  Balance training, Stair training, Taping, Joint mobilization, Spinal mobilization, DME instructions, Cryotherapy, and Moist heat  PLAN FOR NEXT SESSION: Gait training, progress HEP as pt tolerates. SHERRYLL LITTIE Helene FORBES Danilo Cappiello PT 11/24/23 9:30 AM Phone: [phone] Fax: [phone]

## 2023-11-24 NOTE — Progress Notes (Signed)
  Subjective:  Patient ID: Lisa Mosley, female    DOB: 12/04/1975,  MRN: [id number]  Chief Complaint  Patient presents with   Post-op Follow-up     RM 20 POST OP - POV # 3 DOS 10/14/23 LT ANKLE ARTHOSCOPY W POSSIBLE LIGAMENT REPAIR. Patient states moderate pain of the left ankle when a walking.   48 y.o. female returns for post-op check.  Doing okay she is in physical therapy  Review of Systems: Negative except as noted in the HPI. Denies N/V/F/Ch.   Objective:  There were no vitals filed for this visit. Body mass index is 24.69 kg/m. Constitutional Well developed. Well nourished.  Vascular Foot warm and well perfused. Capillary refill normal to all digits.  Calf is soft and supple, no posterior calf or knee pain, negative Homans' sign  Neurologic Normal speech. Oriented to person, place, and time. Epicritic sensation to light touch grossly present bilaterally.  Dermatologic Skin healing well without signs of infection. Skin edges well coapted without signs of infection.  Orthopedic: Little to no edema or pain.  She has good range of motion of the ankle joint.  No pain with eversion stress exam.  Slight pain over anterior syndesmosis   Multiple view plain film radiographs: No change in alignment or implant Assessment:   1. Closed fracture of left ankle with routine healing, subsequent encounter   2. Ankle syndesmosis disruption, left, subsequent encounter    Plan:  Patient was evaluated and treated and all questions answered.  S/p foot surgery right - Has some anterior ankle pain with extreme dorsiflexion but overall doing well.  Good stability.  Continue to physical therapy.  May transition to lace up ankle brace and shoe.  Okay to come out of brace for therapy exercises.  Follow-up with me in 6 weeks and plan to transition to shoe gear alone  Return in about 6 weeks (around 01/05/2024) for post op (no x-rays).

## 2023-11-28 ENCOUNTER — Encounter: Payer: Self-pay | Admitting: Physical Therapy

## 2023-11-28 ENCOUNTER — Ambulatory Visit: Admitting: Physical Therapy

## 2023-11-28 DIAGNOSIS — M6281 Muscle weakness (generalized): Secondary | ICD-10-CM

## 2023-11-28 DIAGNOSIS — M25572 Pain in left ankle and joints of left foot: Secondary | ICD-10-CM | POA: Diagnosis not present

## 2023-11-28 DIAGNOSIS — R2689 Other abnormalities of gait and mobility: Secondary | ICD-10-CM

## 2023-11-28 NOTE — Therapy (Signed)
 OUTPATIENT PHYSICAL THERAPY DAILY NOTE   Patient Name: Lisa Mosley MRN: [id number] DOB:1975-10-25, 48 y.o., female Today's Date: 11/28/2023  END OF SESSION:  PT End of Session - 11/28/23 0718     Visit Number 6    Number of Visits 16    Date for PT Re-Evaluation 01/03/24    Authorization Type [employer]    PT Start Time [phone]    PT Stop Time 0754    PT Time Calculation (min) 38 min          Past Medical History:  Diagnosis Date   HTN (hypertension)    Migraines    Nephrolithiasis    PVC (premature ventricular contraction)    Stomach tumor (benign)    Past Surgical History:  Procedure Laterality Date   ABDOMINAL HYSTERECTOMY     APPENDECTOMY     CHOLECYSTECTOMY     HERNIA REPAIR     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)     TUBAL LIGATION     Now reversed   Patient Active Problem List   Diagnosis Date Noted   PVC's (premature ventricular contractions) 07/13/2017   Fatigue 07/13/2017   Hypertension 07/13/2017   Severe dysplasia of cervix 04/17/2015    PCP: Leonel Cole, MD    PCP: Leonel Cole, MD   REFERRING PROVIDER: Silva Juliene SAUNDERS, DPM   REFERRING DIAG:  [phone] (ICD-10-CM) - Ankle instability, left  M25.872 (ICD-10-CM) - Impingement syndrome of left ankle  S93.432D (ICD-10-CM) - Ankle syndesmosis disruption, left, subsequent encounter    THERAPY DIAG:  Pain in left ankle and joints of left foot  Other abnormalities of gait and mobility  Muscle weakness (generalized)  Rationale for Evaluation and Treatment: Rehabilitation  ONSET DATE: surgery 10-14-23  SUBJECTIVE:   SUBJECTIVE STATEMENT:  11/28/2023: Pt saw MD and was taken out of boot. Reports the pain has increased as expected. Plans to wear the boot for longer distances.   EVAL: Pt accompanied by spouse to evaluation.Pt has been seen in clinic formerly for L distal fibular fx with avulsion of medial malleolus after fall 02-01-23. I had surgery on  10-14-23 and I was here in clinic in January 2025. I broke my ankle in October 2024 and was never painfree. I had surgery and had ligament repaired and now I am in PT. I would like to be able to walk my dog, boxer pit bull mix and eventually running.  I would also like to return to strength training. I was sometimes at [employer] and [facility] at [place]. 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 I am non wt bearing and I am not doing household chores  PERTINENT HISTORY: Stomach tumor removed benign, PVC's, osteopenia, bariatric surgery PAIN:  Are you having pain? Yes: NPRS scale: at rest 2/10  and at worst 5/10 Pain location: Left and Right lateral ankle where incisions are. Pain description: achy Aggravating factors: trying to stretch too much in Dorsiflexion, not standing for longer than 5 minutes Relieving factors: ice and elevate leg and use PRICE Aggravating factors- cat lies on it initially   PRECAUTIONS: Other: WBAT L in CAM walking boot  may be full weightbearing as tolerated with range of motion   RED FLAGS: None   WEIGHT BEARING RESTRICTIONS: Yes WBAT L  FALLS:  Has patient fallen in last 6 months? Yes. Number of falls 5 x  while using crutches NWB L  LIVING ENVIRONMENT: Lives with: lives with their family Lives in: House/apartment Stairs: 4 steps to [employer]  and inside but bedroom on main floor Has following equipment at home: Single point cane, Crutches, Wheelchair (manual), and Shower bench  OCCUPATION: High school Teacher English  PLOF: Independent  PATIENT GOALS: decrease pain in left foot and be able to return to walking dog  NEXT MD VISIT: TBD  OBJECTIVE:  Note: Objective measures were completed at Evaluation unless otherwise noted.  DIAGNOSTIC FINDINGS: See medical record  PATIENT SURVEYS:  LEFS  Extreme difficulty/unable (0), Quite a bit of difficulty (1), Moderate difficulty (2), Little difficulty (3), No difficulty (4) Survey date:    11-08-23  Any of your  usual work, housework or school activities     0  2. Usual hobbies, recreational or sporting activities      0  3. Getting into/out of the bath      1  4. Walking between rooms       0  5. Putting on socks/shoes       4  6. Squatting       0  7. Lifting an object, like a bag of groceries from the floor       1  8. Performing light activities around your home       1  9. Performing heavy activities around your home       1  10. Getting into/out of a car       2  11. Walking 2 blocks       0  12. Walking 1 mile       0  13. Going up/down 10 stairs (1 flight)       0  14. Standing for 1 hour       0  15.  sitting for 1 hour        4  16. Running on even ground       0  17. Running on uneven ground        0  18. Making sharp turns while running fast        0  19. Hopping         0  20. Rolling over in bed       4  Score total:  18/80  (22.5 %)     COGNITION: Overall cognitive status: Within functional limits for tasks assessed     SENSATION: WFL  EDEMA:  Figure 8: Right 49 cm  L 51 cm    POSTURE: rounded shoulders, forward head, and weight shift right  PALPATION: TTP to bil ankle malleous and along L fibularis longus and brevis  LOWER EXTREMITY ROM:  Active ROM Right eval Left eval Left  11/22/23  Hip flexion     Hip extension     Hip abduction     Hip adduction     Hip internal rotation     Hip external rotation     Knee flexion 130 132   Knee extension     Ankle dorsiflexion 15 1 7   Ankle plantarflexion 55 49   Ankle inversion 40 29   Ankle eversion 35 22    (Blank rows = not tested)  LOWER EXTREMITY MMT:  Left side not tested due to post surgery  MMT Right eval Left eval  Hip flexion 5   Hip extension    Hip abduction    Hip adduction    Hip internal rotation    Hip external rotation    Knee flexion 5   Knee extension 5   Ankle  dorsiflexion 5 3-  Ankle plantarflexion 4 3-  Ankle inversion 5 3-  Ankle eversion 5 3-   (Blank rows = not  tested)  LOWER EXTREMITY SPECIAL TESTS:  NT due to post surgery FUNCTIONAL TESTS:  5 times sit to stand: 9.73 sec 2 minute walk test: TBD     SL stance Left 0 sec  Right  30 sec GAIT: Distance walked: 300 ft Assistive device utilized: Crutches WBAT L Level of assistance: Modified independence Comments: Pt with better balance with WBAT L and able to execute without difficulty.  Pt with decreased gait speed and wt bear to right                                                                                                                                 TREATMENT DATE:   [facility] Adult PT Treatment  11/28/2023:  Therapeutic Exercise:  Bike - L2 - 5 min Seated heel and toe raises  Ankle PF blue TB 3 x 15  Seated achilles stretch - 1'  Seated soleus stretch - 1'  4 inch step up x 10   Neuromuscular re-ed: BAPS board L4 - 20x ea direction, circles each direction - resisted inv/ev - 1 weights Tandem level surface  Tandem ARIEX - added head turns SLS left - level surface  SLS with 3 way hip lifts - light touch       HOME EXERCISE PROGRAM: Access Code: [email or web address] URL: [email or web address] Date: 11/08/2023 Prepared by: Graydon Dingwall  Exercises - Supine Ankle Dorsiflexion and Plantarflexion AROM  - 1 x daily - 7 x weekly - 3 sets - 10 reps - Supine Ankle Inversion and Eversion AROM  - 1 x daily - 7 x weekly - 3 sets - 10 reps - Supine Ankle Circles  - 1 x daily - 7 x weekly - 3 sets - 10 reps - Standing Weight Shift Side to Side  - 1 x daily - 7 x weekly - 3 sets - 10 reps - Standing Weight Shifting Forward and Backward  - 1 x daily - 7 x weekly - 3 sets - 10 reps - Staggered Stance Step through [employer] on Crutches  - 1 x daily - 7 x weekly - 3 sets - 10 reps - Seated Ankle Inversion with Resistance and Legs Crossed  - 1 x daily - 7 x weekly - 2 sets - 10 reps - Seated Ankle Dorsiflexion with Resistance  - 1 x daily - 7 x weekly - 2 sets - 10 reps - Seated Ankle  Eversion with Resistance  - 1 x daily - 7 x weekly - 2 sets - 10 reps - Seated Ankle Plantarflexion with Resistance  - 1 x daily - 7 x weekly - 2 sets - 10 reps  ASSESSMENT:  CLINICAL IMPRESSION:  11/28/2023:  Nathanel tolerated session well with no adverse reaction. Pt reports no increase in pain with listed exercises with exception of  closed chain toe raises.   Will advance standing strengthening as tolerated.     EVAL: Patient is a 48 y.o. female who was seen today for physical therapy evaluation and treatment for Left ankle arthroscopy/syndesmosis repair now WBAT L in CAM boot and now beginning WBAT L today. Pt reports falling with crutches with lack of balance due to NWB status. Pt seems to be more steady post session with new WB status. Pt LEFS score shows decrease in functional ability below PLOF. Pt would benefit from skilled PT to address impairments, decrease pain and return to WNL ankle ROM and improving gait and balance to return to walking dog and safe mobility for home and work  OBJECTIVE IMPAIRMENTS: decreased activity tolerance, decreased balance, decreased knowledge of condition, decreased knowledge of use of DME, decreased mobility, difficulty walking, decreased ROM, decreased strength, and pain.   ACTIVITY LIMITATIONS: carrying, lifting, bending, standing, squatting, stairs, transfers, bed mobility, and locomotion level  PARTICIPATION LIMITATIONS: meal prep, cleaning, laundry, driving, shopping, community activity, and occupation  PERSONAL FACTORS: Stomach tumor removed benign, PVC's, osteopenia, bariatric surgery are also affecting patient's functional outcome.   REHAB POTENTIAL: Excellent  CLINICAL DECISION MAKING: Stable/uncomplicated  EVALUATION COMPLEXITY: Low   GOALS: Goals reviewed with patient? Yes  SHORT TERM GOALS: Target date: 12-06-23 Pt will be compliant and knowledgeable with initial HEP for improved comfort and carryover and care post op Baseline:limited  knowledge Goal status: INITIAL  2.  Pt will self report left ankle pain not greater than 4/10 for improved comfort and functional mobility in CAM boot with WBAT L Baseline: WBAT L  5/10 Goal status: INITIAL  3.  Pt will be able to wean to a normal tennis show with minimal pain Baseline: In CAM walking boot Goal status: INITIAL    LONG TERM GOALS: Target date: 01-26-24  I with advanced HEP Baseline: limited knowledge Goal status: INITIAL  2.  Pt will walk dog for 1 mile with LRAD with minimal pain 2/10 or less Baseline: on crutches and WBAT L Goal status: INITIAL  3.   Pt will improve L ankle DF ROM to no less than 10 degrees for improved ankle mobility and gait  Baseline: Left DF 1 degree Goal status: INITIAL  4.  Pt LEFS will improve to at least 36/80 to show improved functional change over 8 weeks Baseline: 18/80 Goal status: INITIAL  5.  Pt will be able to perform standing SL heel raise on LT 15/25 x to show improved tolerance and strength  Baseline: 0/25  Goal Status INITIAL  6. Pt will be able to perform household chores with minimal pain for 30 minutes  Baseline: unable to stand for more than 5 minutes  Goal Status INITIAL   PLAN:  PT FREQUENCY: 1-2x/week  PT DURATION: 8 weeks  PLANNED INTERVENTIONS: 97164- PT Re-evaluation, 97750- Physical Performance Testing, 97110-Therapeutic exercises, 97530- Therapeutic activity, [id number]- Neuromuscular re-education, 97535- Self Care, 02859- Manual therapy, [id number]- Gait training, [phone]- Aquatic Therapy, [phone]- Electrical stimulation (manual), Patient/Family education, Balance training, Stair training, Taping, Joint mobilization, Spinal mobilization, DME instructions, Cryotherapy, and Moist heat  PLAN FOR NEXT SESSION: Gait training, progress HEP as pt tolerates. WBAT L, 2 MWT?    Harlene Persons, PTA 11/28/23 7:56 AM Phone: [phone] Fax: [phone]

## 2023-12-02 ENCOUNTER — Encounter: Admitting: Physical Therapy

## 2023-12-02 ENCOUNTER — Ambulatory Visit: Admitting: Physical Therapy

## 2023-12-06 ENCOUNTER — Encounter: Payer: Self-pay | Admitting: Physical Therapy

## 2023-12-06 ENCOUNTER — Ambulatory Visit: Attending: Podiatry | Admitting: Physical Therapy

## 2023-12-06 DIAGNOSIS — M6281 Muscle weakness (generalized): Secondary | ICD-10-CM | POA: Diagnosis present

## 2023-12-06 DIAGNOSIS — R2689 Other abnormalities of gait and mobility: Secondary | ICD-10-CM | POA: Insufficient documentation

## 2023-12-06 DIAGNOSIS — M25572 Pain in left ankle and joints of left foot: Secondary | ICD-10-CM | POA: Insufficient documentation

## 2023-12-06 NOTE — Therapy (Signed)
 OUTPATIENT PHYSICAL THERAPY DAILY NOTE   Patient Name: Lisa Mosley MRN: [id number] DOB:11-30-1975, 48 y.o., female Today's Date: 12/06/2023  END OF SESSION:  PT End of Session - 12/06/23 0803     Visit Number 7    Number of Visits 16    Date for PT Re-Evaluation 01/03/24    Authorization Type [employer]    PT Start Time 0802    PT Stop Time [phone]    PT Time Calculation (min) 41 min          Past Medical History:  Diagnosis Date   HTN (hypertension)    Migraines    Nephrolithiasis    PVC (premature ventricular contraction)    Stomach tumor (benign)    Past Surgical History:  Procedure Laterality Date   ABDOMINAL HYSTERECTOMY     APPENDECTOMY     CHOLECYSTECTOMY     HERNIA REPAIR     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)     TUBAL LIGATION     Now reversed   Patient Active Problem List   Diagnosis Date Noted   PVC's (premature ventricular contractions) 07/13/2017   Fatigue 07/13/2017   Hypertension 07/13/2017   Severe dysplasia of cervix 04/17/2015    PCP: Leonel Cole, MD    PCP: Leonel Cole, MD   REFERRING PROVIDER: Silva Juliene SAUNDERS, DPM   REFERRING DIAG:  [id number] (ICD-10-CM) - Ankle instability, left  M25.872 (ICD-10-CM) - Impingement syndrome of left ankle  S93.432D (ICD-10-CM) - Ankle syndesmosis disruption, left, subsequent encounter    THERAPY DIAG:  Pain in left ankle and joints of left foot  Other abnormalities of gait and mobility  Muscle weakness (generalized)  Rationale for Evaluation and Treatment: Rehabilitation  ONSET DATE: surgery 10-14-23  SUBJECTIVE:   SUBJECTIVE STATEMENT:  12/06/2023: Pt reports that she is tired after traveling last week.  She feels her ankle is a bit stronger than before.  EVAL: Pt accompanied by spouse to evaluation.Pt has been seen in clinic formerly for L distal fibular fx with avulsion of medial malleolus after fall 02-01-23. I had surgery on 10-14-23 and I was here  in clinic in January 2025. I broke my ankle in October 2024 and was never painfree. I had surgery and had ligament repaired and now I am in PT. I would like to be able to walk my dog, boxer pit bull mix and eventually running.  I would also like to return to strength training. I was sometimes at [employer] and [employer] at [place]. 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 I am non wt bearing and I am not doing household chores  PERTINENT HISTORY: Stomach tumor removed benign, PVC's, osteopenia, bariatric surgery PAIN:  Are you having pain? Yes: NPRS scale: at rest 2/10  and at worst 5/10 Pain location: Left and Right lateral ankle where incisions are. Pain description: achy Aggravating factors: trying to stretch too much in Dorsiflexion, not standing for longer than 5 minutes Relieving factors: ice and elevate leg and use PRICE Aggravating factors- cat lies on it initially   PRECAUTIONS: Other: WBAT L in CAM walking boot  may be full weightbearing as tolerated with range of motion   RED FLAGS: None   WEIGHT BEARING RESTRICTIONS: Yes WBAT L  FALLS:  Has patient fallen in last 6 months? Yes. Number of falls 5 x  while using crutches NWB L  LIVING ENVIRONMENT: Lives with: lives with their family Lives in: House/apartment Stairs: 4 steps to porch and inside but bedroom  on main floor Has following equipment at home: Single point cane, Crutches, Wheelchair (manual), and Shower bench  OCCUPATION: High school Teacher English  PLOF: Independent  PATIENT GOALS: decrease pain in left foot and be able to return to walking dog  NEXT MD VISIT: TBD  OBJECTIVE:  Note: Objective measures were completed at Evaluation unless otherwise noted.  DIAGNOSTIC FINDINGS: See medical record  PATIENT SURVEYS:  LEFS  Extreme difficulty/unable (0), Quite a bit of difficulty (1), Moderate difficulty (2), Little difficulty (3), No difficulty (4) Survey date:    11-08-23  Any of your usual work, housework or  school activities     0  2. Usual hobbies, recreational or sporting activities      0  3. Getting into/out of the bath      1  4. Walking between rooms       0  5. Putting on socks/shoes       4  6. Squatting       0  7. Lifting an object, like a bag of groceries from the floor       1  8. Performing light activities around your home       1  9. Performing heavy activities around your home       1  10. Getting into/out of a car       2  11. Walking 2 blocks       0  12. Walking 1 mile       0  13. Going up/down 10 stairs (1 flight)       0  14. Standing for 1 hour       0  15.  sitting for 1 hour        4  16. Running on even ground       0  17. Running on uneven ground        0  18. Making sharp turns while running fast        0  19. Hopping         0  20. Rolling over in bed       4  Score total:  18/80  (22.5 %)     COGNITION: Overall cognitive status: Within functional limits for tasks assessed     SENSATION: WFL  EDEMA:  Figure 8: Right 49 cm  L 51 cm    POSTURE: rounded shoulders, forward head, and weight shift right  PALPATION: TTP to bil ankle malleous and along L fibularis longus and brevis  LOWER EXTREMITY ROM:  Active ROM Right eval Left eval Left  11/22/23  Hip flexion     Hip extension     Hip abduction     Hip adduction     Hip internal rotation     Hip external rotation     Knee flexion 130 132   Knee extension     Ankle dorsiflexion 15 1 7   Ankle plantarflexion 55 49   Ankle inversion 40 29   Ankle eversion 35 22    (Blank rows = not tested)  LOWER EXTREMITY MMT:  Left side not tested due to post surgery  MMT Right eval Left eval  Hip flexion 5   Hip extension    Hip abduction    Hip adduction    Hip internal rotation    Hip external rotation    Knee flexion 5   Knee extension 5   Ankle dorsiflexion 5 3-  Ankle plantarflexion 4 3-  Ankle inversion 5 3-  Ankle eversion 5 3-   (Blank rows = not tested)  LOWER EXTREMITY SPECIAL  TESTS:  NT due to post surgery FUNCTIONAL TESTS:  5 times sit to stand: 9.73 sec 2 minute walk test: TBD     SL stance Left 0 sec  Right  30 sec GAIT: Distance walked: 300 ft Assistive device utilized: Crutches WBAT L Level of assistance: Modified independence Comments: Pt with better balance with WBAT L and able to execute without difficulty.  Pt with decreased gait speed and wt bear to right                                                                                                                                 TREATMENT DATE:   [facility] Adult PT Treatment  12/06/2023:  Therapeutic Exercise:  Bike - L3 - 5 min Leg press heel raise - 20# - 3x10 ea SL  Therapeutic Activity  Leg press 20# - SL - 3x10 2 in lateral step down - 3x10  Neuromuscular re-ed: BAPS board L5 - 20x ea direction, circles each direction - resisted inv/ev - 1 weights - inv toughest  Tandem walking SLS with 3 way hip lifts - light touch       HOME EXERCISE PROGRAM: Access Code: [email or web address] URL: [email or web address] Date: 11/08/2023 Prepared by: Graydon Dingwall  Exercises - Supine Ankle Dorsiflexion and Plantarflexion AROM  - 1 x daily - 7 x weekly - 3 sets - 10 reps - Supine Ankle Inversion and Eversion AROM  - 1 x daily - 7 x weekly - 3 sets - 10 reps - Supine Ankle Circles  - 1 x daily - 7 x weekly - 3 sets - 10 reps - Standing Weight Shift Side to Side  - 1 x daily - 7 x weekly - 3 sets - 10 reps - Standing Weightx Shifting Forward and Backward  - 1 x daily - 7 x weekly - 3 sets - 10 reps - Staggered Stance Step through [employer] on Crutches  - 1 x daily - 7 x weekly - 3 sets - 10 reps - Seated Ankle Inversion with Resistance and Legs Crossed  - 1 x daily - 7 x weekly - 2 sets - 10 reps - Seated Ankle Dorsiflexion with Resistance  - 1 x daily - 7 x weekly - 2 sets - 10 reps - Seated Ankle Eversion with Resistance  - 1 x daily - 7 x weekly - 2 sets - 10 reps - Seated Ankle  Plantarflexion with Resistance  - 1 x daily - 7 x weekly - 2 sets - 10 reps  ASSESSMENT:  CLINICAL IMPRESSION:  12/06/2023:  Nathanel tolerated session well with no adverse reaction. Beginning work on eccentric g/s dorsiflexion control and dynamic balance.  Pt challenged but no increase in pain.    EVAL: Patient is a 48 y.o. female who was seen  today for physical therapy evaluation and treatment for Left ankle arthroscopy/syndesmosis repair now WBAT L in CAM boot and now beginning WBAT L today. Pt reports falling with crutches with lack of balance due to NWB status. Pt seems to be more steady post session with new WB status. Pt LEFS score shows decrease in functional ability below PLOF. Pt would benefit from skilled PT to address impairments, decrease pain and return to WNL ankle ROM and improving gait and balance to return to walking dog and safe mobility for home and work  OBJECTIVE IMPAIRMENTS: decreased activity tolerance, decreased balance, decreased knowledge of condition, decreased knowledge of use of DME, decreased mobility, difficulty walking, decreased ROM, decreased strength, and pain.   ACTIVITY LIMITATIONS: carrying, lifting, bending, standing, squatting, stairs, transfers, bed mobility, and locomotion level  PARTICIPATION LIMITATIONS: meal prep, cleaning, laundry, driving, shopping, community activity, and occupation  PERSONAL FACTORS: Stomach tumor removed benign, PVC's, osteopenia, bariatric surgery are also affecting patient's functional outcome.   REHAB POTENTIAL: Excellent  CLINICAL DECISION MAKING: Stable/uncomplicated  EVALUATION COMPLEXITY: Low   GOALS: Goals reviewed with patient? Yes  SHORT TERM GOALS: Target date: 12-06-23 Pt will be compliant and knowledgeable with initial HEP for improved comfort and carryover and care post op Baseline:limited knowledge Goal status: INITIAL  2.  Pt will self report left ankle pain not greater than 4/10 for improved comfort and  functional mobility in CAM boot with WBAT L Baseline: WBAT L  5/10 Goal status: INITIAL  3.  Pt will be able to wean to a normal tennis show with minimal pain Baseline: In CAM walking boot Goal status: INITIAL    LONG TERM GOALS: Target date: 01-26-24  I with advanced HEP Baseline: limited knowledge Goal status: INITIAL  2.  Pt will walk dog for 1 mile with LRAD with minimal pain 2/10 or less Baseline: on crutches and WBAT L Goal status: INITIAL  3.   Pt will improve L ankle DF ROM to no less than 10 degrees for improved ankle mobility and gait  Baseline: Left DF 1 degree Goal status: INITIAL  4.  Pt LEFS will improve to at least 36/80 to show improved functional change over 8 weeks Baseline: 18/80 Goal status: INITIAL  5.  Pt will be able to perform standing SL heel raise on LT 15/25 x to show improved tolerance and strength  Baseline: 0/25  Goal Status INITIAL  6. Pt will be able to perform household chores with minimal pain for 30 minutes  Baseline: unable to stand for more than 5 minutes  Goal Status INITIAL   PLAN:  PT FREQUENCY: 1-2x/week  PT DURATION: 8 weeks  PLANNED INTERVENTIONS: 97164- PT Re-evaluation, 97750- Physical Performance Testing, 97110-Therapeutic exercises, 97530- Therapeutic activity, [id number]- Neuromuscular re-education, 97535- Self Care, 02859- Manual therapy, [id number]- Gait training, [phone]- Aquatic Therapy, [phone]- Electrical stimulation (manual), Patient/Family education, Balance training, Stair training, Taping, Joint mobilization, Spinal mobilization, DME instructions, Cryotherapy, and Moist heat  PLAN FOR NEXT SESSION: Gait training, progress HEP as pt tolerates. WBAT L, 2 MWT?   Helene BRAVO Ruhi Kopke PT 12/06/23 10:40 AM Phone: [phone] Fax: [phone]

## 2023-12-07 ENCOUNTER — Ambulatory Visit
Admission: RE | Admit: 2023-12-07 | Discharge: 2023-12-07 | Disposition: A | Discharge status: Home / Self Care | Source: Ambulatory Visit | Attending: Family Medicine | Admitting: Family Medicine

## 2023-12-07 DIAGNOSIS — N6310 Unspecified lump in the right breast, unspecified quadrant: Secondary | ICD-10-CM

## 2023-12-08 ENCOUNTER — Encounter: Payer: Self-pay | Admitting: Physical Therapy

## 2023-12-08 ENCOUNTER — Ambulatory Visit: Admitting: Physical Therapy

## 2023-12-08 DIAGNOSIS — M25572 Pain in left ankle and joints of left foot: Secondary | ICD-10-CM

## 2023-12-08 DIAGNOSIS — R2689 Other abnormalities of gait and mobility: Secondary | ICD-10-CM

## 2023-12-08 DIAGNOSIS — M6281 Muscle weakness (generalized): Secondary | ICD-10-CM

## 2023-12-08 NOTE — Therapy (Deleted)
 OUTPATIENT PHYSICAL THERAPY DAILY NOTE   Patient Name: Lisa Mosley MRN: [id number] DOB:May 31, 1975, 48 y.o., female Today's Date: 12/08/2023  END OF SESSION:    Past Medical History:  Diagnosis Date   HTN (hypertension)    Migraines    Nephrolithiasis    PVC (premature ventricular contraction)    Stomach tumor (benign)    Past Surgical History:  Procedure Laterality Date   ABDOMINAL HYSTERECTOMY     APPENDECTOMY     CHOLECYSTECTOMY     HERNIA REPAIR     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)     TUBAL LIGATION     Now reversed   Patient Active Problem List   Diagnosis Date Noted   PVC's (premature ventricular contractions) 07/13/2017   Fatigue 07/13/2017   Hypertension 07/13/2017   Severe dysplasia of cervix 04/17/2015    PCP: Leonel Cole, MD    PCP: Leonel Cole, MD   REFERRING PROVIDER: Silva Juliene SAUNDERS, DPM   REFERRING DIAG:  [phone] (ICD-10-CM) - Ankle instability, left  M25.872 (ICD-10-CM) - Impingement syndrome of left ankle  S93.432D (ICD-10-CM) - Ankle syndesmosis disruption, left, subsequent encounter    THERAPY DIAG:  Pain in left ankle and joints of left foot  Other abnormalities of gait and mobility  Muscle weakness (generalized)  Rationale for Evaluation and Treatment: Rehabilitation  ONSET DATE: surgery 10-14-23  SUBJECTIVE:   SUBJECTIVE STATEMENT:  12/08/2023: Pt reports that she is tired after traveling last week.  She feels her ankle is a bit stronger than before.  EVAL: Pt accompanied by spouse to evaluation.Pt has been seen in clinic formerly for L distal fibular fx with avulsion of medial malleolus after fall 02-01-23. I had surgery on 10-14-23 and I was here in clinic in January 2025. I broke my ankle in October 2024 and was never painfree. I had surgery and had ligament repaired and now I am in PT. I would like to be able to walk my dog, boxer pit bull mix and eventually running.  I would also like to  return to strength training. I was sometimes at [employer] and [facility] at [place]. 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 I am non wt bearing and I am not doing household chores  PERTINENT HISTORY: Stomach tumor removed benign, PVC's, osteopenia, bariatric surgery PAIN:  Are you having pain? Yes: NPRS scale: at rest 2/10  and at worst 5/10 Pain location: Left and Right lateral ankle where incisions are. Pain description: achy Aggravating factors: trying to stretch too much in Dorsiflexion, not standing for longer than 5 minutes Relieving factors: ice and elevate leg and use PRICE Aggravating factors- cat lies on it initially   PRECAUTIONS: Other: WBAT L in CAM walking boot  may be full weightbearing as tolerated with range of motion   RED FLAGS: None   WEIGHT BEARING RESTRICTIONS: Yes WBAT L  FALLS:  Has patient fallen in last 6 months? Yes. Number of falls 5 x  while using crutches NWB L  LIVING ENVIRONMENT: Lives with: lives with their family Lives in: House/apartment Stairs: 4 steps to porch and inside but bedroom on main floor Has following equipment at home: Single point cane, Crutches, Wheelchair (manual), and Tour manager  OCCUPATION: High school Teacher English  PLOF: Independent  PATIENT GOALS: decrease pain in left foot and be able to return to walking dog  NEXT MD VISIT: TBD  OBJECTIVE:  Note: Objective measures were completed at Evaluation unless otherwise noted.  DIAGNOSTIC FINDINGS: See medical record  PATIENT SURVEYS:  LEFS  Extreme difficulty/unable (0), Quite a bit of difficulty (1), Moderate difficulty (2), Little difficulty (3), No difficulty (4) Survey date:    11-08-23  Any of your usual work, housework or school activities     0  2. Usual hobbies, recreational or sporting activities      0  3. Getting into/out of the bath      1  4. Walking between rooms       0  5. Putting on socks/shoes       4  6. Squatting       0  7. Lifting an object,  like a bag of groceries from the floor       1  8. Performing light activities around your home       1  9. Performing heavy activities around your home       1  10. Getting into/out of a car       2  11. Walking 2 blocks       0  12. Walking 1 mile       0  13. Going up/down 10 stairs (1 flight)       0  14. Standing for 1 hour       0  15.  sitting for 1 hour        4  16. Running on even ground       0  17. Running on uneven ground        0  18. Making sharp turns while running fast        0  19. Hopping         0  20. Rolling over in bed       4  Score total:  18/80  (22.5 %)     COGNITION: Overall cognitive status: Within functional limits for tasks assessed     SENSATION: WFL  EDEMA:  Figure 8: Right 49 cm  L 51 cm    POSTURE: rounded shoulders, forward head, and weight shift right  PALPATION: TTP to bil ankle malleous and along L fibularis longus and brevis  LOWER EXTREMITY ROM:  Active ROM Right eval Left eval Left  11/22/23  Hip flexion     Hip extension     Hip abduction     Hip adduction     Hip internal rotation     Hip external rotation     Knee flexion 130 132   Knee extension     Ankle dorsiflexion 15 1 7   Ankle plantarflexion 55 49   Ankle inversion 40 29   Ankle eversion 35 22    (Blank rows = not tested)  LOWER EXTREMITY MMT:  Left side not tested due to post surgery  MMT Right eval Left eval  Hip flexion 5   Hip extension    Hip abduction    Hip adduction    Hip internal rotation    Hip external rotation    Knee flexion 5   Knee extension 5   Ankle dorsiflexion 5 3-  Ankle plantarflexion 4 3-  Ankle inversion 5 3-  Ankle eversion 5 3-   (Blank rows = not tested)  LOWER EXTREMITY SPECIAL TESTS:  NT due to post surgery FUNCTIONAL TESTS:  5 times sit to stand: 9.73 sec 2 minute walk test: TBD     SL stance Left 0 sec  Right  30 sec GAIT: Distance walked: 300 ft Assistive  device utilized: Crutches WBAT L Level of  assistance: Modified independence Comments: Pt with better balance with WBAT L and able to execute without difficulty.  Pt with decreased gait speed and wt bear to right                                                                                                                                 TREATMENT DATE:   [facility] Adult PT Treatment  12/08/2023:  Therapeutic Exercise:  Bike - L3 - 5 min Leg press heel raise - 20# - 3x10 ea SL RTB inversion eversion  Therapeutic Activity  Step up with controlled step down 2 in lateral step down - 3x10 Monster walks  Neuromuscular re-ed: Tandem standing Tandem walking SLS with 3 way hip lifts - light touch       HOME EXERCISE PROGRAM: Access Code: [email or web address] URL: [email or web address] Date: 11/08/2023 Prepared by: Graydon Dingwall  Exercises - Supine Ankle Dorsiflexion and Plantarflexion AROM  - 1 x daily - 7 x weekly - 3 sets - 10 reps - Supine Ankle Inversion and Eversion AROM  - 1 x daily - 7 x weekly - 3 sets - 10 reps - Supine Ankle Circles  - 1 x daily - 7 x weekly - 3 sets - 10 reps - Standing Weight Shift Side to Side  - 1 x daily - 7 x weekly - 3 sets - 10 reps - Standing Weightx Shifting Forward and Backward  - 1 x daily - 7 x weekly - 3 sets - 10 reps - Staggered Stance Step through [employer] on Crutches  - 1 x daily - 7 x weekly - 3 sets - 10 reps - Seated Ankle Inversion with Resistance and Legs Crossed  - 1 x daily - 7 x weekly - 2 sets - 10 reps - Seated Ankle Dorsiflexion with Resistance  - 1 x daily - 7 x weekly - 2 sets - 10 reps - Seated Ankle Eversion with Resistance  - 1 x daily - 7 x weekly - 2 sets - 10 reps - Seated Ankle Plantarflexion with Resistance  - 1 x daily - 7 x weekly - 2 sets - 10 reps  ASSESSMENT:  CLINICAL IMPRESSION:  12/08/2023:  ***    EVAL: Patient is a 48 y.o. female who was seen today for physical therapy evaluation and treatment for Left ankle arthroscopy/syndesmosis repair now  WBAT L in CAM boot and now beginning WBAT L today. Pt reports falling with crutches with lack of balance due to NWB status. Pt seems to be more steady post session with new WB status. Pt LEFS score shows decrease in functional ability below PLOF. Pt would benefit from skilled PT to address impairments, decrease pain and return to WNL ankle ROM and improving gait and balance to return to walking dog and safe mobility for home and work  OBJECTIVE IMPAIRMENTS: decreased activity tolerance, decreased balance, decreased knowledge of condition, decreased  knowledge of use of DME, decreased mobility, difficulty walking, decreased ROM, decreased strength, and pain.   ACTIVITY LIMITATIONS: carrying, lifting, bending, standing, squatting, stairs, transfers, bed mobility, and locomotion level  PARTICIPATION LIMITATIONS: meal prep, cleaning, laundry, driving, shopping, community activity, and occupation  PERSONAL FACTORS: Stomach tumor removed benign, PVC's, osteopenia, bariatric surgery are also affecting patient's functional outcome.   REHAB POTENTIAL: Excellent  CLINICAL DECISION MAKING: Stable/uncomplicated  EVALUATION COMPLEXITY: Low   GOALS: Goals reviewed with patient? Yes  SHORT TERM GOALS: Target date: 12-06-23 Pt will be compliant and knowledgeable with initial HEP for improved comfort and carryover and care post op Baseline:limited knowledge Goal status: INITIAL  2.  Pt will self report left ankle pain not greater than 4/10 for improved comfort and functional mobility in CAM boot with WBAT L Baseline: WBAT L  5/10 Goal status: INITIAL  3.  Pt will be able to wean to a normal tennis show with minimal pain Baseline: In CAM walking boot Goal status: INITIAL    LONG TERM GOALS: Target date: 01-26-24  I with advanced HEP Baseline: limited knowledge Goal status: INITIAL  2.  Pt will walk dog for 1 mile with LRAD with minimal pain 2/10 or less Baseline: on crutches and WBAT L Goal  status: INITIAL  3.   Pt will improve L ankle DF ROM to no less than 10 degrees for improved ankle mobility and gait  Baseline: Left DF 1 degree Goal status: INITIAL  4.  Pt LEFS will improve to at least 36/80 to show improved functional change over 8 weeks Baseline: 18/80 Goal status: INITIAL  5.  Pt will be able to perform standing SL heel raise on LT 15/25 x to show improved tolerance and strength  Baseline: 0/25  Goal Status INITIAL  6. Pt will be able to perform household chores with minimal pain for 30 minutes  Baseline: unable to stand for more than 5 minutes  Goal Status INITIAL   PLAN:  PT FREQUENCY: 1-2x/week  PT DURATION: 8 weeks  PLANNED INTERVENTIONS: 97164- PT Re-evaluation, 97750- Physical Performance Testing, 97110-Therapeutic exercises, 97530- Therapeutic activity, [id number]- Neuromuscular re-education, 97535- Self Care, [id number]- Manual therapy, [id number]- Gait training, [phone]- Aquatic Therapy, [phone]- Electrical stimulation (manual), Patient/Family education, Balance training, Stair training, Taping, Joint mobilization, Spinal mobilization, DME instructions, Cryotherapy, and Moist heat  PLAN FOR NEXT SESSION: Gait training, progress HEP as pt tolerates. WBAT L, 2 MWT?   ***

## 2023-12-08 NOTE — Therapy (Signed)
 OUTPATIENT PHYSICAL THERAPY DAILY NOTE   Patient Name: Pamla Pangle MRN: [id number] DOB:[birth year], 48 y.o., female Today's Date: 12/08/2023  END OF SESSION:  PT End of Session - 12/08/23 0800     Visit Number 8    Number of Visits 16    Date for PT Re-Evaluation 01/03/24    Authorization Type [employer]    PT Start Time 0800    PT Stop Time [phone]    PT Time Calculation (min) 43 min    Activity Tolerance Patient tolerated treatment well    Behavior During Therapy WFL for tasks assessed/performed           Past Medical History:  Diagnosis Date   HTN (hypertension)    Migraines    Nephrolithiasis    PVC (premature ventricular contraction)    Stomach tumor (benign)    Past Surgical History:  Procedure Laterality Date   ABDOMINAL HYSTERECTOMY     APPENDECTOMY     CHOLECYSTECTOMY     HERNIA REPAIR     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)     TUBAL LIGATION     Now reversed   Patient Active Problem List   Diagnosis Date Noted   PVC's (premature ventricular contractions) 07/13/2017   Fatigue 07/13/2017   Hypertension 07/13/2017   Severe dysplasia of cervix 04/17/2015    PCP: Leonel Cole, MD    PCP: Leonel Cole, MD   REFERRING PROVIDER: Silva Juliene SAUNDERS, DPM   REFERRING DIAG:  [phone] (ICD-10-CM) - Ankle instability, left  M25.872 (ICD-10-CM) - Impingement syndrome of left ankle  S93.432D (ICD-10-CM) - Ankle syndesmosis disruption, left, subsequent encounter    THERAPY DIAG:  Pain in left ankle and joints of left foot  Other abnormalities of gait and mobility  Muscle weakness (generalized)  Rationale for Evaluation and Treatment: Rehabilitation  ONSET DATE: surgery 10-14-23  SUBJECTIVE:   SUBJECTIVE STATEMENT:  12/08/2023: reports feeling sore after last session but she went to [employer] for two hours in addition to therapy so thinks that couldve caused it. Reports some ankle swelling today. Not in a lot of  pain today.   EVAL: Pt accompanied by spouse to evaluation.Pt has been seen in clinic formerly for L distal fibular fx with avulsion of medial malleolus after fall 02-01-23. I had surgery on 10-14-23 and I was here in clinic in January 2025. I broke my ankle in October 2024 and was never painfree. I had surgery and had ligament repaired and now I am in PT. I would like to be able to walk my dog, boxer pit bull mix and eventually running.  I would also like to return to strength training. I was sometimes at [employer] and [facility] at [place]. 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 I am non wt bearing and I am not doing household chores  PERTINENT HISTORY: Stomach tumor removed benign, PVC's, osteopenia, bariatric surgery PAIN:  Are you having pain? Yes: NPRS scale: at rest 2/10  and at worst 5/10 Pain location: Left and Right lateral ankle where incisions are. Pain description: achy Aggravating factors: trying to stretch too much in Dorsiflexion, not standing for longer than 5 minutes Relieving factors: ice and elevate leg and use PRICE Aggravating factors- cat lies on it initially   PRECAUTIONS: Other: WBAT L in CAM walking boot  may be full weightbearing as tolerated with range of motion   RED FLAGS: None   WEIGHT BEARING RESTRICTIONS: Yes WBAT L  FALLS:  Has patient fallen  in last 6 months? Yes. Number of falls 5 x  while using crutches NWB L  LIVING ENVIRONMENT: Lives with: lives with their family Lives in: House/apartment Stairs: 4 steps to porch and inside but bedroom on main floor Has following equipment at home: Single point cane, Crutches, Wheelchair (manual), and Tour manager  OCCUPATION: High school Teacher English  PLOF: Independent  PATIENT GOALS: decrease pain in left foot and be able to return to walking dog  NEXT MD VISIT: TBD  OBJECTIVE:  Note: Objective measures were completed at Evaluation unless otherwise noted.  DIAGNOSTIC FINDINGS: See medical  record  PATIENT SURVEYS:  LEFS  Extreme difficulty/unable (0), Quite a bit of difficulty (1), Moderate difficulty (2), Little difficulty (3), No difficulty (4) Survey date:    11-08-23  Any of your usual work, housework or school activities     0  2. Usual hobbies, recreational or sporting activities      0  3. Getting into/out of the bath      1  4. Walking between rooms       0  5. Putting on socks/shoes       4  6. Squatting       0  7. Lifting an object, like a bag of groceries from the floor       1  8. Performing light activities around your home       1  9. Performing heavy activities around your home       1  10. Getting into/out of a car       2  11. Walking 2 blocks       0  12. Walking 1 mile       0  13. Going up/down 10 stairs (1 flight)       0  14. Standing for 1 hour       0  15.  sitting for 1 hour        4  16. Running on even ground       0  17. Running on uneven ground        0  18. Making sharp turns while running fast        0  19. Hopping         0  20. Rolling over in bed       4  Score total:  18/80  (22.5 %)     COGNITION: Overall cognitive status: Within functional limits for tasks assessed     SENSATION: WFL  EDEMA:  Figure 8: Right 49 cm  L 51 cm    POSTURE: rounded shoulders, forward head, and weight shift right  PALPATION: TTP to bil ankle malleous and along L fibularis longus and brevis  LOWER EXTREMITY ROM:  Active ROM Right eval Left eval Left  11/22/23 Left 12/07/23  Hip flexion      Hip extension      Hip abduction      Hip adduction      Hip internal rotation      Hip external rotation      Knee flexion 130 132    Knee extension      Ankle dorsiflexion 15 1 7 15   Ankle plantarflexion 55 49  50  Ankle inversion 40 29  28  Ankle eversion 35 22  20   (Blank rows = not tested)  LOWER EXTREMITY MMT:  Left side not tested due to post surgery  MMT Right  eval Left eval  Hip flexion 5   Hip extension    Hip abduction     Hip adduction    Hip internal rotation    Hip external rotation    Knee flexion 5   Knee extension 5   Ankle dorsiflexion 5 3-  Ankle plantarflexion 4 3-  Ankle inversion 5 3-  Ankle eversion 5 3-   (Blank rows = not tested)  LOWER EXTREMITY SPECIAL TESTS:  NT due to post surgery FUNCTIONAL TESTS:  5 times sit to stand: 9.73 sec 2 minute walk test: TBD     SL stance Left 0 sec  Right  30 sec GAIT: Distance walked: 300 ft Assistive device utilized: Crutches WBAT L Level of assistance: Modified independence Comments: Pt with better balance with WBAT L and able to execute without difficulty.  Pt with decreased gait speed and wt bear to right                                                                                                                                 TREATMENT DATE:   [facility] Adult PT Treatment  12/08/2023:  Therapeutic Exercise:   Bike - L3 - 3 min SL Leg press  - 20# - 3x10 Leg press heel raises 3x10 Inversion/eversion/DF btb - 2x10 Standing HR x20  Therapeutic Activity 2 in lateral step down - 3x10 Step up with reverse step down 6 in step 2x10  Neuromuscular re-ed: Tandem walking 10 ft x 3 laps  Tandem stance air ex pad 3x30 sec SLS with 3 way hip lifts - light touch x      HOME EXERCISE PROGRAM: Access Code: [email or web address] URL: [email or web address] Date: 11/08/2023 Prepared by: Graydon Dingwall  Exercises - Supine Ankle Dorsiflexion and Plantarflexion AROM  - 1 x daily - 7 x weekly - 3 sets - 10 reps - Supine Ankle Inversion and Eversion AROM  - 1 x daily - 7 x weekly - 3 sets - 10 reps - Supine Ankle Circles  - 1 x daily - 7 x weekly - 3 sets - 10 reps - Standing Weight Shift Side to Side  - 1 x daily - 7 x weekly - 3 sets - 10 reps - Standing Weightx Shifting Forward and Backward  - 1 x daily - 7 x weekly - 3 sets - 10 reps - Staggered Stance Step through [employer] on Crutches  - 1 x daily - 7 x weekly - 3 sets - 10 reps -  Seated Ankle Inversion with Resistance and Legs Crossed  - 1 x daily - 7 x weekly - 2 sets - 10 reps - Seated Ankle Dorsiflexion with Resistance  - 1 x daily - 7 x weekly - 2 sets - 10 reps - Seated Ankle Eversion with Resistance  - 1 x daily - 7 x weekly - 2 sets - 10 reps - Seated Ankle Plantarflexion with Resistance  - 1 x  daily - 7 x weekly - 2 sets - 10 reps  ASSESSMENT:  CLINICAL IMPRESSION:  12/08/2023: Pt completed all exercises without adverse affect. DF ROM is measuring WNL today at beginning of session. Able to continue with strength training to patients tolerance. Pt still feeling ankle discomfort with heel raises. Pts balance was challenged today but she is demonstrates good ablility to maintain ankle stability without discomfort. Will continue to work on static and dynamic balance tasks. The pt continues to benefit from skilled PT services to address her deficits and improve her functional capacity so that she can return to PLOF.    EVAL: Patient is a 48 y.o. female who was seen today for physical therapy evaluation and treatment for Left ankle arthroscopy/syndesmosis repair now WBAT L in CAM boot and now beginning WBAT L today. Pt reports falling with crutches with lack of balance due to NWB status. Pt seems to be more steady post session with new WB status. Pt LEFS score shows decrease in functional ability below PLOF. Pt would benefit from skilled PT to address impairments, decrease pain and return to WNL ankle ROM and improving gait and balance to return to walking dog and safe mobility for home and work  OBJECTIVE IMPAIRMENTS: decreased activity tolerance, decreased balance, decreased knowledge of condition, decreased knowledge of use of DME, decreased mobility, difficulty walking, decreased ROM, decreased strength, and pain.   ACTIVITY LIMITATIONS: carrying, lifting, bending, standing, squatting, stairs, transfers, bed mobility, and locomotion level  PARTICIPATION LIMITATIONS:  meal prep, cleaning, laundry, driving, shopping, community activity, and occupation  PERSONAL FACTORS: Stomach tumor removed benign, PVC's, osteopenia, bariatric surgery are also affecting patient's functional outcome.   REHAB POTENTIAL: Excellent  CLINICAL DECISION MAKING: Stable/uncomplicated  EVALUATION COMPLEXITY: Low   GOALS: Goals reviewed with patient? Yes  SHORT TERM GOALS: Target date: 12-06-23 Pt will be compliant and knowledgeable with initial HEP for improved comfort and carryover and care post op Baseline:limited knowledge Goal status: INITIAL  2.  Pt will self report left ankle pain not greater than 4/10 for improved comfort and functional mobility in CAM boot with WBAT L Baseline: WBAT L  5/10 Goal status: INITIAL  3.  Pt will be able to wean to a normal tennis show with minimal pain Baseline: In CAM walking boot Goal status: INITIAL    LONG TERM GOALS: Target date: 01-26-24  I with advanced HEP Baseline: limited knowledge Goal status: INITIAL  2.  Pt will walk dog for 1 mile with LRAD with minimal pain 2/10 or less Baseline: on crutches and WBAT L Goal status: INITIAL  3.   Pt will improve L ankle DF ROM to no less than 10 degrees for improved ankle mobility and gait  Baseline: Left DF 1 degree Goal status: INITIAL  4.  Pt LEFS will improve to at least 36/80 to show improved functional change over 8 weeks Baseline: 18/80 Goal status: INITIAL  5.  Pt will be able to perform standing SL heel raise on LT 15/25 x to show improved tolerance and strength  Baseline: 0/25  Goal Status INITIAL  6. Pt will be able to perform household chores with minimal pain for 30 minutes  Baseline: unable to stand for more than 5 minutes  Goal Status INITIAL   PLAN:  PT FREQUENCY: 1-2x/week  PT DURATION: 8 weeks  PLANNED INTERVENTIONS: 97164- PT Re-evaluation, 97750- Physical Performance Testing, 97110-Therapeutic exercises, 97530- Therapeutic activity, [id number]-  Neuromuscular re-education, 97535- Self Care, 02859- Manual therapy, [id number]- Gait training, [phone]-  Aquatic Therapy, [phone]- Electrical stimulation (manual), Patient/Family education, Balance training, Stair training, Taping, Joint mobilization, Spinal mobilization, DME instructions, Cryotherapy, and Moist heat  PLAN FOR NEXT SESSION: Gait training, progress HEP as pt tolerates. WBAT L, 2 MWT?   Christepher Melchior, SPT 12/08/23 8:52 AM

## 2023-12-19 ENCOUNTER — Ambulatory Visit: Admitting: Physical Therapy

## 2023-12-22 ENCOUNTER — Encounter: Payer: Self-pay | Admitting: Physical Therapy

## 2023-12-22 ENCOUNTER — Ambulatory Visit: Admitting: Physical Therapy

## 2023-12-22 DIAGNOSIS — M25572 Pain in left ankle and joints of left foot: Secondary | ICD-10-CM

## 2023-12-22 DIAGNOSIS — M6281 Muscle weakness (generalized): Secondary | ICD-10-CM

## 2023-12-22 DIAGNOSIS — R2689 Other abnormalities of gait and mobility: Secondary | ICD-10-CM

## 2023-12-22 NOTE — Therapy (Signed)
 OUTPATIENT PHYSICAL THERAPY DAILY NOTE   Patient Name: Lisa Mosley MRN: [id number] DOB:[birth year], 48 y.o., female Today's Date: 12/22/2023  END OF SESSION:  PT End of Session - 12/22/23 0717     Visit Number 9    Number of Visits 16    Date for PT Re-Evaluation 01/03/24    Authorization Type [employer] [facility]    PT Start Time [phone]    PT Stop Time 0755    PT Time Calculation (min) 38 min           Past Medical History:  Diagnosis Date   HTN (hypertension)    Migraines    Nephrolithiasis    PVC (premature ventricular contraction)    Stomach tumor (benign)    Past Surgical History:  Procedure Laterality Date   ABDOMINAL HYSTERECTOMY     APPENDECTOMY     CHOLECYSTECTOMY     HERNIA REPAIR     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)     TUBAL LIGATION     Now reversed   Patient Active Problem List   Diagnosis Date Noted   PVC's (premature ventricular contractions) 07/13/2017   Fatigue 07/13/2017   Hypertension 07/13/2017   Severe dysplasia of cervix 04/17/2015    PCP: Leonel Cole, MD    PCP: Leonel Cole, MD   REFERRING PROVIDER: Silva Juliene SAUNDERS, DPM   REFERRING DIAG:  [phone] (ICD-10-CM) - Ankle instability, left  M25.872 (ICD-10-CM) - Impingement syndrome of left ankle  S93.432D (ICD-10-CM) - Ankle syndesmosis disruption, left, subsequent encounter    THERAPY DIAG:  Pain in left ankle and joints of left foot  Other abnormalities of gait and mobility  Muscle weakness (generalized)  Rationale for Evaluation and Treatment: Rehabilitation  ONSET DATE: surgery 10-14-23  SUBJECTIVE:   SUBJECTIVE STATEMENT: 12/22/2023: ankle hurts some the brace puts pressure on it, I should ice it more. Walking pain is 5/10.    EVAL: Pt accompanied by spouse to evaluation.Pt has been seen in clinic formerly for L distal fibular fx with avulsion of medial malleolus after fall 02-01-23. I had surgery on 10-14-23 and I was here in clinic  in January 2025. I broke my ankle in October 2024 and was never painfree. I had surgery and had ligament repaired and now I am in PT. I would like to be able to walk my dog, boxer pit bull mix and eventually running.  I would also like to return to strength training. I was sometimes at [employer] and [facility] at [place]. 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 I am non wt bearing and I am not doing household chores  PERTINENT HISTORY: Stomach tumor removed benign, PVC's, osteopenia, bariatric surgery PAIN:  Are you having pain? Yes: NPRS scale: at rest 0/10  and at worst 5/10 Pain location: Left and Right lateral ankle where incisions are. Pain description: achy Aggravating factors: trying to stretch too much in Dorsiflexion, not standing for longer than 5 minutes Relieving factors: ice and elevate leg and use PRICE Aggravating factors- cat lies on it initially   PRECAUTIONS: Other: WBAT L in CAM walking boot  may be full weightbearing as tolerated with range of motion   RED FLAGS: None   WEIGHT BEARING RESTRICTIONS: Yes WBAT L  FALLS:  Has patient fallen in last 6 months? Yes. Number of falls 5 x  while using crutches NWB L  LIVING ENVIRONMENT: Lives with: lives with their family Lives in: House/apartment Stairs: 4 steps to porch and inside but bedroom on  main floor Has following equipment at home: Single point cane, Crutches, Wheelchair (manual), and Shower bench  OCCUPATION: High school Teacher English  PLOF: Independent  PATIENT GOALS: decrease pain in left foot and be able to return to walking dog  NEXT MD VISIT: TBD  OBJECTIVE:  Note: Objective measures were completed at Evaluation unless otherwise noted.  DIAGNOSTIC FINDINGS: See medical record  PATIENT SURVEYS:  LEFS  Extreme difficulty/unable (0), Quite a bit of difficulty (1), Moderate difficulty (2), Little difficulty (3), No difficulty (4) Survey date:    11-08-23  Any of your usual work, housework or school  activities     0  2. Usual hobbies, recreational or sporting activities      0  3. Getting into/out of the bath      1  4. Walking between rooms       0  5. Putting on socks/shoes       4  6. Squatting       0  7. Lifting an object, like a bag of groceries from the floor       1  8. Performing light activities around your home       1  9. Performing heavy activities around your home       1  10. Getting into/out of a car       2  11. Walking 2 blocks       0  12. Walking 1 mile       0  13. Going up/down 10 stairs (1 flight)       0  14. Standing for 1 hour       0  15.  sitting for 1 hour        4  16. Running on even ground       0  17. Running on uneven ground        0  18. Making sharp turns while running fast        0  19. Hopping         0  20. Rolling over in bed       4  Score total:  18/80  (22.5 %)     COGNITION: Overall cognitive status: Within functional limits for tasks assessed     SENSATION: WFL  EDEMA:  Figure 8: Right 49 cm  L 51 cm    POSTURE: rounded shoulders, forward head, and weight shift right  PALPATION: TTP to bil ankle malleous and along L fibularis longus and brevis  LOWER EXTREMITY ROM:  Active ROM Right eval Left eval Left  11/22/23 Left 12/07/23  Hip flexion      Hip extension      Hip abduction      Hip adduction      Hip internal rotation      Hip external rotation      Knee flexion 130 132    Knee extension      Ankle dorsiflexion 15 1 7 15   Ankle plantarflexion 55 49  50  Ankle inversion 40 29  28  Ankle eversion 35 22  20   (Blank rows = not tested)  LOWER EXTREMITY MMT:  Left side not tested due to post surgery  MMT Right eval Left eval  Hip flexion 5   Hip extension    Hip abduction    Hip adduction    Hip internal rotation    Hip external rotation    Knee flexion  5   Knee extension 5   Ankle dorsiflexion 5 3-  Ankle plantarflexion 4 3-  Ankle inversion 5 3-  Ankle eversion 5 3-   (Blank rows = not  tested)  LOWER EXTREMITY SPECIAL TESTS:  NT due to post surgery FUNCTIONAL TESTS:  5 times sit to stand: 9.73 sec 2 minute walk test: TBD     SL stance Left 0 sec  Right  30 sec GAIT: Distance walked: 300 ft Assistive device utilized: Crutches WBAT L Level of assistance: Modified independence Comments: Pt with better balance with WBAT L and able to execute without difficulty.  Pt with decreased gait speed and wt bear to right                                                                                                                                 TREATMENT DATE:   [facility] Adult PT Treatment  12/22/2023:  Therapeutic Exercise:   Bike - L3 - 3 min Slant board stretch gastroc and soleus  Standing HR bilateral to eccentric lower on left  x20 GTB DF seated x 20  Therapeutic Activity 4 in lateral step down - 2x10 Step up with opp knee drive  8 in step [id number] 2 inch front step down x 10  4 inch front step down -  x 10, increased medial ankle pain   Neuromuscular re-ed: Tandem walking on foam 10 ft x 3 laps  Tandem stance air ex pad 3x30 sec SLS on foam with 3 way hip lifts - light touch x      HOME EXERCISE PROGRAM: Access Code: [email or web address] URL: [email or web address] Date: 11/08/2023 Prepared by: Graydon Dingwall  Exercises - Supine Ankle Dorsiflexion and Plantarflexion AROM  - 1 x daily - 7 x weekly - 3 sets - 10 reps - Supine Ankle Inversion and Eversion AROM  - 1 x daily - 7 x weekly - 3 sets - 10 reps - Supine Ankle Circles  - 1 x daily - 7 x weekly - 3 sets - 10 reps - Standing Weight Shift Side to Side  - 1 x daily - 7 x weekly - 3 sets - 10 reps - Standing Weightx Shifting Forward and Backward  - 1 x daily - 7 x weekly - 3 sets - 10 reps - Staggered Stance Step through [employer] on Crutches  - 1 x daily - 7 x weekly - 3 sets - 10 reps - Seated Ankle Inversion with Resistance and Legs Crossed  - 1 x daily - 7 x weekly - 2 sets - 10 reps - Seated Ankle  Dorsiflexion with Resistance  - 1 x daily - 7 x weekly - 2 sets - 10 reps - Seated Ankle Eversion with Resistance  - 1 x daily - 7 x weekly - 2 sets - 10 reps - Seated Ankle Plantarflexion with Resistance  - 1 x daily - 7 x weekly - 2 sets -  10 reps  ASSESSMENT:  CLINICAL IMPRESSION:  12/22/2023: Pt completed all exercises without adverse affect. Pt reports continued pain while walking. She reports that she has been walking a lot since getting out of the cam boot which hasn't allowed for much rest to the ankle. More pain with DF movements at medial ankle. Step downs especially were aggravating today. No pain with heel raises. Pt's balance was challenged today but she is demonstrates good ablility to maintain ankle stability without discomfort.  The pt continues to benefit from skilled PT services to address her deficits and improve her functional capacity so that she can return to PLOF.    EVAL: Patient is a 48 y.o. female who was seen today for physical therapy evaluation and treatment for Left ankle arthroscopy/syndesmosis repair now WBAT L in CAM boot and now beginning WBAT L today. Pt reports falling with crutches with lack of balance due to NWB status. Pt seems to be more steady post session with new WB status. Pt LEFS score shows decrease in functional ability below PLOF. Pt would benefit from skilled PT to address impairments, decrease pain and return to WNL ankle ROM and improving gait and balance to return to walking dog and safe mobility for home and work  OBJECTIVE IMPAIRMENTS: decreased activity tolerance, decreased balance, decreased knowledge of condition, decreased knowledge of use of DME, decreased mobility, difficulty walking, decreased ROM, decreased strength, and pain.   ACTIVITY LIMITATIONS: carrying, lifting, bending, standing, squatting, stairs, transfers, bed mobility, and locomotion level  PARTICIPATION LIMITATIONS: meal prep, cleaning, laundry, driving, shopping, community  activity, and occupation  PERSONAL FACTORS: Stomach tumor removed benign, PVC's, osteopenia, bariatric surgery are also affecting patient's functional outcome.   REHAB POTENTIAL: Excellent  CLINICAL DECISION MAKING: Stable/uncomplicated  EVALUATION COMPLEXITY: Low   GOALS: Goals reviewed with patient? Yes  SHORT TERM GOALS: Target date: 12-06-23 Pt will be compliant and knowledgeable with initial HEP for improved comfort and carryover and care post op Baseline:limited knowledge Goal status: MET  2.  Pt will self report left ankle pain not greater than 4/10 for improved comfort and functional mobility in CAM boot with WBAT L Baseline: WBAT L  5/10 12/22/23: 5/10 Goal status: ONGOING  3.  Pt will be able to wean to a normal tennis show with minimal pain Baseline: In CAM walking boot 12/22/23: continued pain with walking in tennis shoe Goal status: ONGOING    LONG TERM GOALS: Target date: 01-26-24  I with advanced HEP Baseline: limited knowledge Goal status: INITIAL  2.  Pt will walk dog for 1 mile with LRAD with minimal pain 2/10 or less Baseline: on crutches and WBAT L Goal status: INITIAL  3.   Pt will improve L ankle DF ROM to no less than 10 degrees for improved ankle mobility and gait  Baseline: Left DF 1 degree Goal status: INITIAL  4.  Pt LEFS will improve to at least 36/80 to show improved functional change over 8 weeks Baseline: 18/80 Goal status: INITIAL  5.  Pt will be able to perform standing SL heel raise on LT 15/25 x to show improved tolerance and strength  Baseline: 0/25  Goal Status INITIAL  6. Pt will be able to perform household chores with minimal pain for 30 minutes  Baseline: unable to stand for more than 5 minutes  Goal Status INITIAL   PLAN:  PT FREQUENCY: 1-2x/week  PT DURATION: 8 weeks  PLANNED INTERVENTIONS: 97164- PT Re-evaluation, 97750- Physical Performance Testing, 97110-Therapeutic exercises, 97530- Therapeutic  activity, [id number]-  Neuromuscular re-education, [phone]- Self Care, 02859- Manual therapy, [phone]- Gait training, [phone]- Aquatic Therapy, [phone]- Electrical stimulation (manual), Patient/Family education, Balance training, Stair training, Taping, Joint mobilization, Spinal mobilization, DME instructions, Cryotherapy, and Moist heat  PLAN FOR NEXT SESSION: Gait training, progress HEP as pt tolerates. WBAT L, 2 MWT?   Harlene Persons, PTA 12/22/23 9:48 AM Phone: [phone] Fax: [phone]

## 2023-12-27 ENCOUNTER — Encounter: Payer: Self-pay | Admitting: Physical Therapy

## 2023-12-27 ENCOUNTER — Ambulatory Visit: Payer: Self-pay | Admitting: Physical Therapy

## 2023-12-27 DIAGNOSIS — M25572 Pain in left ankle and joints of left foot: Secondary | ICD-10-CM | POA: Diagnosis not present

## 2023-12-27 DIAGNOSIS — M6281 Muscle weakness (generalized): Secondary | ICD-10-CM

## 2023-12-27 DIAGNOSIS — R2689 Other abnormalities of gait and mobility: Secondary | ICD-10-CM

## 2023-12-27 NOTE — Therapy (Signed)
 OUTPATIENT PHYSICAL THERAPY DAILY NOTE   Patient Name: Lisa Mosley MRN: [id number] DOB:01/02/1976, 48 y.o., female Today's Date: 12/27/2023  END OF SESSION:  PT End of Session - 12/27/23 0718     Visit Number 10    Number of Visits 16    Date for PT Re-Evaluation 01/03/24    Authorization Type [employer]    PT Start Time [phone]    PT Stop Time 0800    PT Time Calculation (min) 42 min           Past Medical History:  Diagnosis Date   HTN (hypertension)    Migraines    Nephrolithiasis    PVC (premature ventricular contraction)    Stomach tumor (benign)    Past Surgical History:  Procedure Laterality Date   ABDOMINAL HYSTERECTOMY     APPENDECTOMY     CHOLECYSTECTOMY     HERNIA REPAIR     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)     TUBAL LIGATION     Now reversed   Patient Active Problem List   Diagnosis Date Noted   PVC's (premature ventricular contractions) 07/13/2017   Fatigue 07/13/2017   Hypertension 07/13/2017   Severe dysplasia of cervix 04/17/2015    PCP: Leonel Cole, MD    PCP: Leonel Cole, MD   REFERRING PROVIDER: Silva Juliene SAUNDERS, DPM   REFERRING DIAG:  [phone] (ICD-10-CM) - Ankle instability, left  M25.872 (ICD-10-CM) - Impingement syndrome of left ankle  S93.432D (ICD-10-CM) - Ankle syndesmosis disruption, left, subsequent encounter    THERAPY DIAG:  Pain in left ankle and joints of left foot  Other abnormalities of gait and mobility  Muscle weakness (generalized)  Rationale for Evaluation and Treatment: Rehabilitation  ONSET DATE: surgery 10-14-23  SUBJECTIVE:   SUBJECTIVE STATEMENT: 12/27/2023: Pt reports that overall the ankle is doing well.  She is having the most issue with going down stairs.   EVAL: Pt accompanied by spouse to evaluation.Pt has been seen in clinic formerly for L distal fibular fx with avulsion of medial malleolus after fall 02-01-23. I had surgery on 10-14-23 and I was here  in clinic in January 2025. I broke my ankle in October 2024 and was never painfree. I had surgery and had ligament repaired and now I am in PT. I would like to be able to walk my dog, boxer pit bull mix and eventually running.  I would also like to return to strength training. I was sometimes at [employer] and [facility] at [place]. 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 I am non wt bearing and I am not doing household chores  PERTINENT HISTORY: Stomach tumor removed benign, PVC's, osteopenia, bariatric surgery PAIN:  Are you having pain? Yes: NPRS scale: at rest 0/10  and at worst 5/10 Pain location: Left and Right lateral ankle where incisions are. Pain description: achy Aggravating factors: trying to stretch too much in Dorsiflexion, not standing for longer than 5 minutes Relieving factors: ice and elevate leg and use PRICE Aggravating factors- cat lies on it initially   PRECAUTIONS: Other: WBAT L in CAM walking boot  may be full weightbearing as tolerated with range of motion   RED FLAGS: None   WEIGHT BEARING RESTRICTIONS: Yes WBAT L  FALLS:  Has patient fallen in last 6 months? Yes. Number of falls 5 x  while using crutches NWB L  LIVING ENVIRONMENT: Lives with: lives with their family Lives in: House/apartment Stairs: 4 steps to porch and inside but bedroom  on main floor Has following equipment at home: Single point cane, Crutches, Wheelchair (manual), and Shower bench  OCCUPATION: High school Teacher English  PLOF: Independent  PATIENT GOALS: decrease pain in left foot and be able to return to walking dog  NEXT MD VISIT: TBD  OBJECTIVE:  Note: Objective measures were completed at Evaluation unless otherwise noted.  DIAGNOSTIC FINDINGS: See medical record  PATIENT SURVEYS:  LEFS  Extreme difficulty/unable (0), Quite a bit of difficulty (1), Moderate difficulty (2), Little difficulty (3), No difficulty (4) Survey date:    11-08-23  Any of your usual work, housework or  school activities     0  2. Usual hobbies, recreational or sporting activities      0  3. Getting into/out of the bath      1  4. Walking between rooms       0  5. Putting on socks/shoes       4  6. Squatting       0  7. Lifting an object, like a bag of groceries from the floor       1  8. Performing light activities around your home       1  9. Performing heavy activities around your home       1  10. Getting into/out of a car       2  11. Walking 2 blocks       0  12. Walking 1 mile       0  13. Going up/down 10 stairs (1 flight)       0  14. Standing for 1 hour       0  15.  sitting for 1 hour        4  16. Running on even ground       0  17. Running on uneven ground        0  18. Making sharp turns while running fast        0  19. Hopping         0  20. Rolling over in bed       4  Score total:  18/80  (22.5 %)     COGNITION: Overall cognitive status: Within functional limits for tasks assessed     SENSATION: WFL  EDEMA:  Figure 8: Right 49 cm  L 51 cm    POSTURE: rounded shoulders, forward head, and weight shift right  PALPATION: TTP to bil ankle malleous and along L fibularis longus and brevis  LOWER EXTREMITY ROM:  Active ROM Right eval Left eval Left  11/22/23 Left 12/07/23  Hip flexion      Hip extension      Hip abduction      Hip adduction      Hip internal rotation      Hip external rotation      Knee flexion 130 132    Knee extension      Ankle dorsiflexion 15 1 7 15   Ankle plantarflexion 55 49  50  Ankle inversion 40 29  28  Ankle eversion 35 22  20   (Blank rows = not tested)  LOWER EXTREMITY MMT:  Left side not tested due to post surgery  MMT Right eval Left eval  Hip flexion 5   Hip extension    Hip abduction    Hip adduction    Hip internal rotation    Hip external rotation    Knee  flexion 5   Knee extension 5   Ankle dorsiflexion 5 3-  Ankle plantarflexion 4 3-  Ankle inversion 5 3-  Ankle eversion 5 3-   (Blank rows = not  tested)  LOWER EXTREMITY SPECIAL TESTS:  NT due to post surgery FUNCTIONAL TESTS:  5 times sit to stand: 9.73 sec 2 minute walk test: TBD     SL stance Left 0 sec  Right  30 sec GAIT: Distance walked: 300 ft Assistive device utilized: Crutches WBAT L Level of assistance: Modified independence Comments: Pt with better balance with WBAT L and able to execute without difficulty.  Pt with decreased gait speed and wt bear to right                                                                                                                                 TREATMENT DATE:   [facility] Adult PT Treatment  12/27/2023:  Therapeutic Exercise:   Bike - L3 - 5 min Slant board stretch gastroc and soleus  Standing HR bilateral to eccentric lower on left  x20 - slight negative with step GTB DF seated 3x15 Seated slider for DF  Therapeutic Activity Slider lunge 3 way - 2x6 ea - TRX 2 inch front step down x 10  DF at wall - 2x10  Neuromuscular re-ed: 30''x3  HOME EXERCISE PROGRAM: Access Code: [email or web address] URL: [email or web address] Date: 11/08/2023 Prepared by: Graydon Dingwall  Exercises - Supine Ankle Dorsiflexion and Plantarflexion AROM  - 1 x daily - 7 x weekly - 3 sets - 10 reps - Supine Ankle Inversion and Eversion AROM  - 1 x daily - 7 x weekly - 3 sets - 10 reps - Supine Ankle Circles  - 1 x daily - 7 x weekly - 3 sets - 10 reps - Standing Weight Shift Side to Side  - 1 x daily - 7 x weekly - 3 sets - 10 reps - Standing Weightx Shifting Forward and Backward  - 1 x daily - 7 x weekly - 3 sets - 10 reps - Staggered Stance Step through [employer] on Crutches  - 1 x daily - 7 x weekly - 3 sets - 10 reps - Seated Ankle Inversion with Resistance and Legs Crossed  - 1 x daily - 7 x weekly - 2 sets - 10 reps - Seated Ankle Dorsiflexion with Resistance  - 1 x daily - 7 x weekly - 2 sets - 10 reps - Seated Ankle Eversion with Resistance  - 1 x daily - 7 x weekly - 2 sets - 10 reps -  Seated Ankle Plantarflexion with Resistance  - 1 x daily - 7 x weekly - 2 sets - 10 reps  ASSESSMENT:  CLINICAL IMPRESSION:  12/27/2023: Nathanel tolerated session well with no adverse reaction.  Balance is progressing very well with ability to complete SLS on foam.  Concentrating on tolerance to loaded DF which is improving but still  causes some discomfort at end range.  Pt more limited by fatigue than pain with 3 way lunge slider even while moving into loaded DF (avoiding end range).    EVAL: Patient is a 48 y.o. female who was seen today for physical therapy evaluation and treatment for Left ankle arthroscopy/syndesmosis repair now WBAT L in CAM boot and now beginning WBAT L today. Pt reports falling with crutches with lack of balance due to NWB status. Pt seems to be more steady post session with new WB status. Pt LEFS score shows decrease in functional ability below PLOF. Pt would benefit from skilled PT to address impairments, decrease pain and return to WNL ankle ROM and improving gait and balance to return to walking dog and safe mobility for home and work  OBJECTIVE IMPAIRMENTS: decreased activity tolerance, decreased balance, decreased knowledge of condition, decreased knowledge of use of DME, decreased mobility, difficulty walking, decreased ROM, decreased strength, and pain.   ACTIVITY LIMITATIONS: carrying, lifting, bending, standing, squatting, stairs, transfers, bed mobility, and locomotion level  PARTICIPATION LIMITATIONS: meal prep, cleaning, laundry, driving, shopping, community activity, and occupation  PERSONAL FACTORS: Stomach tumor removed benign, PVC's, osteopenia, bariatric surgery are also affecting patient's functional outcome.   REHAB POTENTIAL: Excellent  CLINICAL DECISION MAKING: Stable/uncomplicated  EVALUATION COMPLEXITY: Low   GOALS: Goals reviewed with patient? Yes  SHORT TERM GOALS: Target date: 12-06-23 Pt will be compliant and knowledgeable with initial  HEP for improved comfort and carryover and care post op Baseline:limited knowledge Goal status: MET  2.  Pt will self report left ankle pain not greater than 4/10 for improved comfort and functional mobility in CAM boot with WBAT L Baseline: WBAT L  5/10 12/22/23: 5/10 Goal status: ONGOING  3.  Pt will be able to wean to a normal tennis show with minimal pain Baseline: In CAM walking boot 12/22/23: continued pain with walking in tennis shoe Goal status: ONGOING    LONG TERM GOALS: Target date: 01-26-24  I with advanced HEP Baseline: limited knowledge Goal status: INITIAL  2.  Pt will walk dog for 1 mile with LRAD with minimal pain 2/10 or less Baseline: on crutches and WBAT L Goal status: INITIAL  3.   Pt will improve L ankle DF ROM to no less than 10 degrees for improved ankle mobility and gait  Baseline: Left DF 1 degree Goal status: INITIAL  4.  Pt LEFS will improve to at least 36/80 to show improved functional change over 8 weeks Baseline: 18/80 Goal status: INITIAL  5.  Pt will be able to perform standing SL heel raise on LT 15/25 x to show improved tolerance and strength  Baseline: 0/25  Goal Status INITIAL  6. Pt will be able to perform household chores with minimal pain for 30 minutes  Baseline: unable to stand for more than 5 minutes  Goal Status INITIAL   PLAN:  PT FREQUENCY: 1-2x/week  PT DURATION: 8 weeks  PLANNED INTERVENTIONS: 97164- PT Re-evaluation, 97750- Physical Performance Testing, 97110-Therapeutic exercises, 97530- Therapeutic activity, [id number]- Neuromuscular re-education, 97535- Self Care, 02859- Manual therapy, [id number]- Gait training, [phone]- Aquatic Therapy, [phone]- Electrical stimulation (manual), Patient/Family education, Balance training, Stair training, Taping, Joint mobilization, Spinal mobilization, DME instructions, Cryotherapy, and Moist heat  PLAN FOR NEXT SESSION: Gait training, progress HEP as pt tolerates. WBAT L, 2 MWT?   Helene BRAVO  Fate Caster PT 12/27/23 8:03 AM Phone: [phone] Fax: [phone]

## 2024-01-04 NOTE — Therapy (Signed)
 OUTPATIENT PHYSICAL THERAPY DAILY NOTE/Discharge   Patient Name: Lisa Mosley MRN: [id number] DOB:12-31-1975, 48 y.o., female Today's Date: 01/05/2024  END OF SESSION:  PT End of Session - 01/05/24 0722     Visit Number 11    Number of Visits 16    Date for PT Re-Evaluation 01/03/24    Authorization Type [employer]    PT Start Time [phone]    PT Stop Time 0800    PT Time Calculation (min) 42 min    Activity Tolerance Patient tolerated treatment well    Behavior During Therapy WFL for tasks assessed/performed            Past Medical History:  Diagnosis Date   HTN (hypertension)    Migraines    Nephrolithiasis    PVC (premature ventricular contraction)    Stomach tumor (benign)    Past Surgical History:  Procedure Laterality Date   ABDOMINAL HYSTERECTOMY     APPENDECTOMY     CHOLECYSTECTOMY     HERNIA REPAIR     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)     TUBAL LIGATION     Now reversed   Patient Active Problem List   Diagnosis Date Noted   PVC's (premature ventricular contractions) 07/13/2017   Fatigue 07/13/2017   Hypertension 07/13/2017   Severe dysplasia of cervix 04/17/2015    PCP: Leonel Cole, MD    PCP: Leonel Cole, MD   REFERRING PROVIDER: Silva Juliene SAUNDERS, DPM   REFERRING DIAG:  [phone] (ICD-10-CM) - Ankle instability, left  M25.872 (ICD-10-CM) - Impingement syndrome of left ankle  S93.432D (ICD-10-CM) - Ankle syndesmosis disruption, left, subsequent encounter    THERAPY DIAG:  Pain in left ankle and joints of left foot  Other abnormalities of gait and mobility  Muscle weakness (generalized)  Rationale for Evaluation and Treatment: Rehabilitation  ONSET DATE: surgery 10-14-23  SUBJECTIVE:   SUBJECTIVE STATEMENT: 01/05/2024 : L ankle is still tender. Going down steps is better, but she tries to cautious. My L ankle much better. It will continue to improve, I just need to build up its strength and  stamina, which will take time.   EVAL: Pt accompanied by spouse to evaluation.Pt has been seen in clinic formerly for L distal fibular fx with avulsion of medial malleolus after fall 02-01-23. I had surgery on 10-14-23 and I was here in clinic in January 2025. I broke my ankle in October 2024 and was never painfree. I had surgery and had ligament repaired and now I am in PT. I would like to be able to walk my dog, boxer pit bull mix and eventually running.  I would also like to return to strength training. I was sometimes at [employer] and [facility] at [place]. 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 I am non wt bearing and I am not doing household chores  PERTINENT HISTORY: Stomach tumor removed benign, PVC's, osteopenia, bariatric surgery PAIN:  Are you having pain? Yes: NPRS scale: at rest 0/10  and at worst 3/10 Pain location: Left and Right lateral ankle where incisions are. Pain description: achy Aggravating factors: trying to stretch too much in Dorsiflexion, not standing for longer than 5 minutes Relieving factors: ice and elevate leg and use PRICE Aggravating factors- cat lies on it initially   PRECAUTIONS: Other: WBAT L in CAM walking boot  may be full weightbearing as tolerated with range of motion   RED FLAGS: None   WEIGHT BEARING RESTRICTIONS: Yes WBAT L  FALLS:  Has patient fallen in last 6 months? Yes. Number of falls 5 x  while using crutches NWB L  LIVING ENVIRONMENT: Lives with: lives with their family Lives in: House/apartment Stairs: 4 steps to porch and inside but bedroom on main floor Has following equipment at home: Single point cane, Crutches, Wheelchair (manual), and Tour manager  OCCUPATION: High school Teacher English  PLOF: Independent  PATIENT GOALS: decrease pain in left foot and be able to return to walking dog  NEXT MD VISIT: TBD  OBJECTIVE:  Note: Objective measures were completed at Evaluation unless otherwise noted.  DIAGNOSTIC FINDINGS: See  medical record  PATIENT SURVEYS:  LEFS  Extreme difficulty/unable (0), Quite a bit of difficulty (1), Moderate difficulty (2), Little difficulty (3), No difficulty (4) Survey date:    11-08-23  Any of your usual work, housework or school activities     0  2. Usual hobbies, recreational or sporting activities      0  3. Getting into/out of the bath      1  4. Walking between rooms       0  5. Putting on socks/shoes       4  6. Squatting       0  7. Lifting an object, like a bag of groceries from the floor       1  8. Performing light activities around your home       1  9. Performing heavy activities around your home       1  10. Getting into/out of a car       2  11. Walking 2 blocks       0  12. Walking 1 mile       0  13. Going up/down 10 stairs (1 flight)       0  14. Standing for 1 hour       0  15.  sitting for 1 hour        4  16. Running on even ground       0  17. Running on uneven ground        0  18. Making sharp turns while running fast        0  19. Hopping         0  20. Rolling over in bed       4  Score total:  18/80  (22.5 %)     COGNITION: Overall cognitive status: Within functional limits for tasks assessed     SENSATION: WFL  EDEMA:  Figure 8: Right 49 cm  L 51 cm    POSTURE: rounded shoulders, forward head, and weight shift right  PALPATION: TTP to bil ankle malleous and along L fibularis longus and brevis  LOWER EXTREMITY ROM:  Active ROM Right eval Left eval Left  11/22/23 Left 12/07/23  Hip flexion      Hip extension      Hip abduction      Hip adduction      Hip internal rotation      Hip external rotation      Knee flexion 130 132    Knee extension      Ankle dorsiflexion 15 1 7 15   Ankle plantarflexion 55 49  50  Ankle inversion 40 29  28  Ankle eversion 35 22  20   (Blank rows = not tested)  LOWER EXTREMITY MMT:  Left side not tested due to post surgery  MMT Right eval Left eval  Hip flexion 5   Hip extension    Hip  abduction    Hip adduction    Hip internal rotation    Hip external rotation    Knee flexion 5   Knee extension 5   Ankle dorsiflexion 5 3-  Ankle plantarflexion 4 3-  Ankle inversion 5 3-  Ankle eversion 5 3-   (Blank rows = not tested)  LOWER EXTREMITY SPECIAL TESTS:  NT due to post surgery FUNCTIONAL TESTS:  5 times sit to stand: 9.73 sec 2 minute walk test: TBD     SL stance Left 0 sec  Right  30 sec GAIT: Distance walked: 300 ft Assistive device utilized: Crutches WBAT L Level of assistance: Modified independence Comments: Pt with better balance with WBAT L and able to execute without difficulty.  Pt with decreased gait speed and wt bear to right                                                                                                                                 TREATMENT DATE:  [facility] Adult PT Treatment:                                                DATE: 01/05/24 Therapeutic Exercise:  Bike - L3 - 5 min Slant board stretch gastroc and soleus  Standing HR L x25 RTB DF seated 2x15 RTB DF seated 2x15 Therapeutic Activity Slider lunge 3 way - 2x6 ea - TRX 4 inch and 6' front step down x 10  Deep squats x12 SL balance floor and air ex  Updated HEP  [facility] Adult PT Treatment  01/05/2024: Therapeutic Exercise:   Bike - L3 - 5 min Slant board stretch gastroc and soleus  Standing HR bilateral to eccentric lower on left  x20 - slight negative with step GTB DF seated 3x15 Seated slider for DF  Therapeutic Activity Slider lunge 3 way - 2x6 ea - TRX 2 inch front step down x 10  DF at wall - 2x10  Neuromuscular re-ed: 30''x3  HOME EXERCISE PROGRAM: Access Code: [email or web address] URL: [email or web address] Date: 01/05/2024 Prepared by: Dasie Daft  Exercises - Supine Ankle Dorsiflexion and Plantarflexion AROM  - 1 x daily - 7 x weekly - 3 sets - 10 reps - Seated Ankle Inversion with Resistance and Legs Crossed  - 1 x daily - 7 x weekly - 2 sets - 10  reps - Seated Ankle Dorsiflexion with Resistance  - 1 x daily - 7 x weekly - 2 sets - 10 reps - Seated Ankle Eversion with Resistance  - 1 x daily - 7 x weekly - 2 sets - 10 reps - Seated Ankle Plantarflexion with Resistance  - 1 x daily - 7 x weekly - 2  sets - 10 reps - Squat with Counter Support  - 1 x daily - 7 x weekly - 2 sets - 10 reps - 3-Way Lunge on Slider  - 1 x daily - 7 x weekly - 2 sets - 5 reps - Single Leg Stance with Support  - 1 x daily - 7 x weekly - 1 sets - 3 reps - 60 hold - Single Leg Heel Raise with Counter Support  - 1 x daily - 7 x weekly - 2 sets - 10 reps - Standing Tandem Balance with Counter Support  - 1 x daily - 7 x weekly - 3 sets - 3 reps - 60 hold  ASSESSMENT:  CLINICAL IMPRESSION: 01/05/2024: Lisa Mosley has completed her course of PT. She has made excellent progress re: ROM, strength and function. See goals below. Today she demonstrated improved ability to dsc steps, reporting no pain, just the needs to improve confidence. Pt's HEP was updated and pt demonstrates Ind which will help to further improve the function of her L ankle. Pt is in agreement with DC from PT.  EVAL: Patient is a 48 y.o. female who was seen today for physical therapy evaluation and treatment for Left ankle arthroscopy/syndesmosis repair now WBAT L in CAM boot and now beginning WBAT L today. Pt reports falling with crutches with lack of balance due to NWB status. Pt seems to be more steady post session with new WB status. Pt LEFS score shows decrease in functional ability below PLOF. Pt would benefit from skilled PT to address impairments, decrease pain and return to WNL ankle ROM and improving gait and balance to return to walking dog and safe mobility for home and work  OBJECTIVE IMPAIRMENTS: decreased activity tolerance, decreased balance, decreased knowledge of condition, decreased knowledge of use of DME, decreased mobility, difficulty walking, decreased ROM, decreased strength, and pain.    ACTIVITY LIMITATIONS: carrying, lifting, bending, standing, squatting, stairs, transfers, bed mobility, and locomotion level  PARTICIPATION LIMITATIONS: meal prep, cleaning, laundry, driving, shopping, community activity, and occupation  PERSONAL FACTORS: Stomach tumor removed benign, PVC's, osteopenia, bariatric surgery are also affecting patient's functional outcome.   REHAB POTENTIAL: Excellent  CLINICAL DECISION MAKING: Stable/uncomplicated  EVALUATION COMPLEXITY: Low   GOALS: Goals reviewed with patient? Yes  SHORT TERM GOALS: Target date: 12-06-23 Pt will be compliant and knowledgeable with initial HEP for improved comfort and carryover and care post op Baseline:limited knowledge Goal status: MET  2.  Pt will self report left ankle pain not greater than 4/10 for improved comfort and functional mobility in CAM boot with WBAT L Baseline: WBAT L  5/10 12/22/23: 5/10 Goal status: ONGOING  3.  Pt will be able to wean to a normal tennis show with minimal pain Baseline: In CAM walking boot 12/22/23: continued pain with walking in tennis shoe Goal status: ONGOING    LONG TERM GOALS: Target date: 01-26-24  I with advanced HEP Baseline: limited knowledge Goal status: MET  2.  Pt will walk dog for 1 mile with LRAD with minimal pain 2/10 or less Baseline: on crutches and WBAT L 01/05/24: pt reports she is walking a mile+ c minimal pain Goal status: MET  3.   Pt will improve L ankle DF ROM to no less than 10 degrees for improved ankle mobility and gait  Baseline: Left DF 1 degree 01/05/24: 15d Goal status: MET  4.  Pt LEFS will improve to at least 36/80 to show improved functional change over 8 weeks Baseline: 18/80  01/05/24: 76/80= 95% Goal status: MET  5.  Pt will be able to perform standing SL heel raise on LT 15/25 x to show improved tolerance and strength  Baseline: 0/25 01/05/24: 25 reps   Goal Status MET  6. Pt will be able to perform household chores with minimal  pain for 30 minutes  Baseline: unable to stand for more than 5 minutes  01/05/24: Pt reports tolerated 30 mins of household chores c min increase inpain  Goal Status MET   PLAN:  PT FREQUENCY: 1-2x/week  PT DURATION: 8 weeks  PLANNED INTERVENTIONS: 97164- PT Re-evaluation, 97750- Physical Performance Testing, 97110-Therapeutic exercises, 97530- Therapeutic activity, [id number]- Neuromuscular re-education, 97535- Self Care, [id number]- Manual therapy, [id number]- Gait training, [phone]- Aquatic Therapy, [phone]- Electrical stimulation (manual), Patient/Family education, Balance training, Stair training, Taping, Joint mobilization, Spinal mobilization, DME instructions, Cryotherapy, and Moist heat  PLAN FOR NEXT SESSION: Gait training, progress HEP as pt tolerates. WBAT L, 2 MWT?   PHYSICAL THERAPY DISCHARGE SUMMARY  Visits from Start of Care: 11  Current functional level related to goals / functional outcomes: See clinical impression and PT goals    Remaining deficits: See clinical impression and PT goals    Education / Equipment:HEP. Pt ED   Patient agrees to discharge. Patient goals were met. Patient is being discharged due to being pleased with the current functional level.   Katoria Yetman MS, PT 01/05/24 6:13 PM

## 2024-01-05 ENCOUNTER — Ambulatory Visit: Payer: Self-pay | Attending: Podiatry

## 2024-01-05 ENCOUNTER — Ambulatory Visit (INDEPENDENT_AMBULATORY_CARE_PROVIDER_SITE_OTHER): Admitting: Podiatry

## 2024-01-05 VITALS — Ht 66.0 in | Wt 153.0 lb

## 2024-01-05 DIAGNOSIS — R2689 Other abnormalities of gait and mobility: Secondary | ICD-10-CM | POA: Insufficient documentation

## 2024-01-05 DIAGNOSIS — S82892D Other fracture of left lower leg, subsequent encounter for closed fracture with routine healing: Secondary | ICD-10-CM

## 2024-01-05 DIAGNOSIS — M25572 Pain in left ankle and joints of left foot: Secondary | ICD-10-CM | POA: Diagnosis present

## 2024-01-05 DIAGNOSIS — S93432D Sprain of tibiofibular ligament of left ankle, subsequent encounter: Secondary | ICD-10-CM

## 2024-01-05 DIAGNOSIS — M25872 Other specified joint disorders, left ankle and foot: Secondary | ICD-10-CM

## 2024-01-05 DIAGNOSIS — M6281 Muscle weakness (generalized): Secondary | ICD-10-CM | POA: Insufficient documentation

## 2024-01-06 NOTE — Progress Notes (Signed)
  Subjective:  Patient ID: Lisa Mosley, female    DOB: 06/09/1975,  MRN: [id number]  Chief Complaint  Patient presents with   Post-op Follow-up    Return in about 6 weeks (around 01/05/2024) for post op #4 (no x-rays). Patient states pain in the left ankle when flexing the left foot forwards and backwards.   48 y.o. female returns for post-op check.  Doing quite well she has some pain on end range of motion but good stability she has finished physical therapy  Review of Systems: Negative except as noted in the HPI. Denies N/V/F/Ch.   Objective:  There were no vitals filed for this visit. Body mass index is 24.69 kg/m. Constitutional Well developed. Well nourished.  Vascular Foot warm and well perfused. Capillary refill normal to all digits.  Calf is soft and supple, no posterior calf or knee pain, negative Homans' sign  Neurologic Normal speech. Oriented to person, place, and time. Epicritic sensation to light touch grossly present bilaterally.  Dermatologic Her incisions are well-healed not hypertrophic mild tenderness on the proximal portion of the fibular incision  Orthopedic: No edema today.  She does have tenderness in the anterior joint line with end range of motion in dorsiflexion.  Good stability good strength 5 out of 5   Multiple view plain film radiographs: No change in alignment or implant Assessment:   1. Closed fracture of left ankle with routine healing, subsequent encounter   2. Ankle syndesmosis disruption, left, subsequent encounter   3. Impingement syndrome of left ankle    Plan:  Patient was evaluated and treated and all questions answered.  S/p foot surgery right - Doing very well I have no physical restrictions for her she may be in regular shoe gear as tolerated she has completed physical therapy.  I will see her back in 3 months for final follow-up 6 months postoperatively.  Return in about 3 months (around 04/05/2024) for f/u ankle repair (new xrays).

## 2024-02-14 ENCOUNTER — Other Ambulatory Visit (HOSPITAL_BASED_OUTPATIENT_CLINIC_OR_DEPARTMENT_OTHER): Payer: Self-pay | Admitting: Family Medicine

## 2024-02-14 DIAGNOSIS — Z1231 Encounter for screening mammogram for malignant neoplasm of breast: Secondary | ICD-10-CM

## 2024-02-21 ENCOUNTER — Other Ambulatory Visit: Payer: Self-pay | Admitting: Student

## 2024-02-28 ENCOUNTER — Other Ambulatory Visit: Payer: Self-pay | Admitting: Medical Genetics

## 2024-03-03 ENCOUNTER — Other Ambulatory Visit: Payer: Self-pay

## 2024-03-05 ENCOUNTER — Encounter (HOSPITAL_BASED_OUTPATIENT_CLINIC_OR_DEPARTMENT_OTHER): Payer: Self-pay | Admitting: Radiology

## 2024-03-05 ENCOUNTER — Ambulatory Visit (HOSPITAL_BASED_OUTPATIENT_CLINIC_OR_DEPARTMENT_OTHER)
Admission: RE | Admit: 2024-03-05 | Discharge: 2024-03-05 | Disposition: A | Discharge status: Home / Self Care | Source: Ambulatory Visit | Attending: Student | Admitting: Student

## 2024-03-05 DIAGNOSIS — Z1231 Encounter for screening mammogram for malignant neoplasm of breast: Secondary | ICD-10-CM | POA: Diagnosis present

## 2024-03-07 ENCOUNTER — Encounter: Payer: Self-pay | Admitting: Cardiology

## 2024-03-08 MED ORDER — FLECAINIDE ACETATE 100 MG PO TABS: 50.0000 mg | ORAL_TABLET | Freq: Two times a day (BID) | ORAL | 2 refills | Status: AC

## 2024-03-13 ENCOUNTER — Other Ambulatory Visit

## 2024-03-31 ENCOUNTER — Other Ambulatory Visit: Payer: Self-pay | Attending: Medical Genetics

## 2024-04-05 ENCOUNTER — Encounter: Admitting: Podiatry

## 2024-05-15 ENCOUNTER — Ambulatory Visit

## 2024-05-15 ENCOUNTER — Encounter: Payer: Self-pay | Admitting: Podiatry

## 2024-05-15 ENCOUNTER — Ambulatory Visit: Admitting: Podiatry

## 2024-05-15 VITALS — Ht 66.0 in | Wt 153.0 lb

## 2024-05-15 DIAGNOSIS — S82892D Other fracture of left lower leg, subsequent encounter for closed fracture with routine healing: Secondary | ICD-10-CM

## 2024-05-17 ENCOUNTER — Encounter: Payer: Self-pay | Admitting: Podiatry

## 2024-05-17 NOTE — Progress Notes (Signed)
"  °  Subjective:  Patient ID: Lisa Mosley, female    DOB: [birth year],  MRN: [id number]  Chief Complaint  Patient presents with   Fracture    RM 4 Patient is here for a post-op f/u on left ankle fracture. Pt states some intermittent pain in the left ankle.   49 y.o. female returns for post-op check.  Doing quite well she has only very intermittent pain typically worse on cold weather days or with bad weather Notes some aching  Review of Systems: Negative except as noted in the HPI. Denies N/V/F/Ch.   Objective:  There were no vitals filed for this visit. Body mass index is 24.69 kg/m. Constitutional Well developed. Well nourished.  Vascular Foot warm and well perfused. Capillary refill normal to all digits.  Calf is soft and supple, no posterior calf or knee pain, negative Homans' sign  Neurologic Normal speech. Oriented to person, place, and time. Epicritic sensation to light touch grossly present bilaterally.  Dermatologic Not hypertrophic nontender incision  Orthopedic: No pain to palpation today good range of motion.  Good stability.   Multiple view plain film radiographs: No change in alignment or implant, mortise is well aligned without progressive arthritic changes Assessment:   1. Closed fracture of left ankle with routine healing, subsequent encounter    Plan:  Patient was evaluated and treated and all questions answered.  S/p foot surgery right - Reviewed new x-rays taken today and shows continued stability.  Discussed if possible would like to leave hardware in place she has minimal pain over the hardware today.  Return as needed activity as tolerated I have no physical restrictions for her at this point.  Long-term still may have posttraumatic arthritis develop but hopefully should be minor for her at this point.  No follow-ups on file.  "

## 2024-05-23 ENCOUNTER — Encounter: Payer: Self-pay | Admitting: Cardiology
